# Patient Record
Sex: Male | Born: 1950 | Race: Black or African American | Hispanic: No | Marital: Single | Smoking: Never smoker
Health system: Southern US, Community
[De-identification: ages and names within clinical notes are randomized; demographics above are authoritative.]

## PROBLEM LIST (undated history)

## (undated) DIAGNOSIS — C799 Secondary malignant neoplasm of unspecified site: Secondary | ICD-10-CM

## (undated) DIAGNOSIS — F79 Unspecified intellectual disabilities: Secondary | ICD-10-CM

## (undated) DIAGNOSIS — F32A Depression, unspecified: Secondary | ICD-10-CM

## (undated) DIAGNOSIS — F329 Major depressive disorder, single episode, unspecified: Secondary | ICD-10-CM

## (undated) DIAGNOSIS — H269 Unspecified cataract: Secondary | ICD-10-CM

## (undated) DIAGNOSIS — C182 Malignant neoplasm of ascending colon: Secondary | ICD-10-CM

## (undated) DIAGNOSIS — E119 Type 2 diabetes mellitus without complications: Secondary | ICD-10-CM

## (undated) DIAGNOSIS — I10 Essential (primary) hypertension: Secondary | ICD-10-CM

## (undated) DIAGNOSIS — F29 Unspecified psychosis not due to a substance or known physiological condition: Secondary | ICD-10-CM

## (undated) DIAGNOSIS — D509 Iron deficiency anemia, unspecified: Secondary | ICD-10-CM

## (undated) DIAGNOSIS — C61 Malignant neoplasm of prostate: Secondary | ICD-10-CM

## (undated) DIAGNOSIS — F0390 Unspecified dementia without behavioral disturbance: Secondary | ICD-10-CM

## (undated) DIAGNOSIS — H9190 Unspecified hearing loss, unspecified ear: Secondary | ICD-10-CM

## (undated) HISTORY — DX: Essential (primary) hypertension: I10

## (undated) HISTORY — DX: Type 2 diabetes mellitus without complications: E11.9

## (undated) HISTORY — DX: Unspecified intellectual disabilities: F79

## (undated) HISTORY — DX: Depression, unspecified: F32.A

## (undated) HISTORY — DX: Unspecified psychosis not due to a substance or known physiological condition: F29

## (undated) HISTORY — DX: Unspecified cataract: H26.9

## (undated) HISTORY — DX: Major depressive disorder, single episode, unspecified: F32.9

## (undated) HISTORY — DX: Secondary malignant neoplasm of unspecified site: C79.9

## (undated) HISTORY — DX: Malignant neoplasm of prostate: C61

---

## 2000-04-14 HISTORY — PX: HEMORROIDECTOMY: SUR656

## 2000-08-26 ENCOUNTER — Encounter: Payer: Self-pay | Admitting: Family Medicine

## 2000-08-26 ENCOUNTER — Ambulatory Visit (HOSPITAL_COMMUNITY): Admission: RE | Admit: 2000-08-26 | Discharge: 2000-08-26 | Payer: Self-pay | Admitting: Family Medicine

## 2000-09-10 ENCOUNTER — Ambulatory Visit (HOSPITAL_COMMUNITY): Admission: RE | Admit: 2000-09-10 | Discharge: 2000-09-10 | Payer: Self-pay | Admitting: Family Medicine

## 2000-09-10 ENCOUNTER — Encounter: Payer: Self-pay | Admitting: Family Medicine

## 2000-10-27 ENCOUNTER — Ambulatory Visit (HOSPITAL_COMMUNITY): Admission: RE | Admit: 2000-10-27 | Discharge: 2000-10-27 | Payer: Self-pay | Admitting: Family Medicine

## 2000-10-27 ENCOUNTER — Encounter: Payer: Self-pay | Admitting: Family Medicine

## 2001-07-01 ENCOUNTER — Encounter: Admission: RE | Admit: 2001-07-01 | Discharge: 2001-09-29 | Payer: Self-pay | Admitting: Family Medicine

## 2001-08-04 ENCOUNTER — Encounter: Payer: Self-pay | Admitting: General Surgery

## 2001-08-04 ENCOUNTER — Ambulatory Visit (HOSPITAL_COMMUNITY): Admission: RE | Admit: 2001-08-04 | Discharge: 2001-08-04 | Payer: Self-pay | Admitting: General Surgery

## 2001-08-06 ENCOUNTER — Observation Stay (HOSPITAL_COMMUNITY): Admission: RE | Admit: 2001-08-06 | Discharge: 2001-08-07 | Payer: Self-pay | Admitting: General Surgery

## 2002-04-08 ENCOUNTER — Emergency Department (HOSPITAL_COMMUNITY): Admission: EM | Admit: 2002-04-08 | Discharge: 2002-04-08 | Payer: Self-pay | Admitting: Emergency Medicine

## 2004-02-26 ENCOUNTER — Ambulatory Visit: Payer: Self-pay | Admitting: Family Medicine

## 2004-03-25 ENCOUNTER — Ambulatory Visit: Payer: Self-pay | Admitting: Family Medicine

## 2004-05-07 ENCOUNTER — Ambulatory Visit: Payer: Self-pay | Admitting: Family Medicine

## 2004-07-02 ENCOUNTER — Ambulatory Visit: Payer: Self-pay | Admitting: Family Medicine

## 2004-12-19 ENCOUNTER — Ambulatory Visit: Payer: Self-pay | Admitting: Family Medicine

## 2005-02-11 ENCOUNTER — Ambulatory Visit: Payer: Self-pay | Admitting: Family Medicine

## 2005-04-02 ENCOUNTER — Ambulatory Visit: Payer: Self-pay | Admitting: Family Medicine

## 2005-04-29 ENCOUNTER — Ambulatory Visit: Payer: Self-pay | Admitting: Family Medicine

## 2005-05-06 ENCOUNTER — Ambulatory Visit (HOSPITAL_COMMUNITY): Admission: RE | Admit: 2005-05-06 | Discharge: 2005-05-06 | Payer: Self-pay | Admitting: Family Medicine

## 2005-05-19 ENCOUNTER — Ambulatory Visit (HOSPITAL_COMMUNITY): Admission: RE | Admit: 2005-05-19 | Discharge: 2005-05-19 | Payer: Self-pay | Admitting: Urology

## 2005-06-24 ENCOUNTER — Ambulatory Visit: Payer: Self-pay | Admitting: Family Medicine

## 2005-08-14 ENCOUNTER — Ambulatory Visit: Payer: Self-pay | Admitting: Family Medicine

## 2005-12-01 ENCOUNTER — Ambulatory Visit: Payer: Self-pay | Admitting: Family Medicine

## 2006-01-13 ENCOUNTER — Ambulatory Visit: Payer: Self-pay | Admitting: Family Medicine

## 2006-02-09 ENCOUNTER — Ambulatory Visit: Payer: Self-pay | Admitting: Family Medicine

## 2006-02-12 ENCOUNTER — Ambulatory Visit: Payer: Self-pay | Admitting: Family Medicine

## 2006-04-24 ENCOUNTER — Ambulatory Visit: Payer: Self-pay | Admitting: Family Medicine

## 2006-04-28 ENCOUNTER — Ambulatory Visit: Payer: Self-pay | Admitting: Family Medicine

## 2006-04-28 LAB — CONVERTED CEMR LAB
BUN: 37 mg/dL — ABNORMAL HIGH (ref 6–23)
Calcium: 8.9 mg/dL (ref 8.4–10.5)
Glucose, Bld: 110 mg/dL — ABNORMAL HIGH (ref 70–99)
PSA: 6.6 ng/mL
Sodium: 137 meq/L (ref 135–145)

## 2006-05-20 ENCOUNTER — Inpatient Hospital Stay (HOSPITAL_COMMUNITY): Admission: EM | Admit: 2006-05-20 | Discharge: 2006-05-20 | Payer: Self-pay | Admitting: Emergency Medicine

## 2006-06-04 ENCOUNTER — Ambulatory Visit: Payer: Self-pay | Admitting: Family Medicine

## 2006-06-18 ENCOUNTER — Ambulatory Visit: Payer: Self-pay | Admitting: Family Medicine

## 2006-07-21 ENCOUNTER — Inpatient Hospital Stay (HOSPITAL_COMMUNITY): Admission: EM | Admit: 2006-07-21 | Discharge: 2006-07-23 | Payer: Self-pay | Admitting: Emergency Medicine

## 2006-07-26 ENCOUNTER — Emergency Department (HOSPITAL_COMMUNITY): Admission: EM | Admit: 2006-07-26 | Discharge: 2006-07-26 | Payer: Self-pay | Admitting: Emergency Medicine

## 2006-08-05 ENCOUNTER — Ambulatory Visit: Payer: Self-pay | Admitting: Family Medicine

## 2006-08-11 ENCOUNTER — Encounter: Payer: Self-pay | Admitting: Family Medicine

## 2006-08-11 LAB — CONVERTED CEMR LAB
ALT: 9 units/L (ref 0–53)
AST: 15 units/L (ref 0–37)
Albumin: 4.5 g/dL (ref 3.5–5.2)
Bilirubin, Direct: 0.2 mg/dL (ref 0.0–0.3)
Calcium: 9.4 mg/dL (ref 8.4–10.5)
Cholesterol: 133 mg/dL (ref 0–200)
Glucose, Bld: 73 mg/dL (ref 70–99)
Indirect Bilirubin: 0.9 mg/dL (ref 0.0–0.9)
LDL Cholesterol: 54 mg/dL (ref 0–99)
Total Bilirubin: 1.1 mg/dL (ref 0.3–1.2)
Triglycerides: 74 mg/dL (ref <150)
VLDL: 15 mg/dL (ref 0–40)

## 2006-08-20 ENCOUNTER — Ambulatory Visit: Payer: Self-pay | Admitting: Family Medicine

## 2006-09-01 ENCOUNTER — Ambulatory Visit: Payer: Self-pay | Admitting: Family Medicine

## 2006-09-02 ENCOUNTER — Encounter: Payer: Self-pay | Admitting: Family Medicine

## 2006-09-17 ENCOUNTER — Ambulatory Visit: Payer: Self-pay | Admitting: Family Medicine

## 2006-09-17 ENCOUNTER — Emergency Department (HOSPITAL_COMMUNITY): Admission: EM | Admit: 2006-09-17 | Discharge: 2006-09-17 | Payer: Self-pay | Admitting: Emergency Medicine

## 2006-10-01 ENCOUNTER — Ambulatory Visit: Payer: Self-pay | Admitting: Family Medicine

## 2006-11-12 ENCOUNTER — Ambulatory Visit: Payer: Self-pay | Admitting: Family Medicine

## 2006-12-24 ENCOUNTER — Ambulatory Visit: Payer: Self-pay | Admitting: Family Medicine

## 2007-02-09 ENCOUNTER — Ambulatory Visit: Payer: Self-pay | Admitting: Family Medicine

## 2007-02-09 LAB — CONVERTED CEMR LAB
ALT: 8 units/L (ref 0–53)
AST: 13 units/L (ref 0–37)
Albumin: 4.3 g/dL (ref 3.5–5.2)
Alkaline Phosphatase: 47 units/L (ref 39–117)
BUN: 14 mg/dL (ref 6–23)
Bilirubin, Direct: 0.2 mg/dL (ref 0.0–0.3)
CO2: 21 meq/L (ref 19–32)
Chloride: 108 meq/L (ref 96–112)
Creatinine, Ser: 0.86 mg/dL (ref 0.40–1.50)
Eosinophils Absolute: 0 10*3/uL (ref 0.0–0.7)
Eosinophils Relative: 1 % (ref 0–5)
Lymphs Abs: 1.5 10*3/uL (ref 0.7–3.3)
Monocytes Absolute: 0.4 10*3/uL (ref 0.2–0.7)
Monocytes Relative: 6 % (ref 3–11)
Neutro Abs: 4.4 10*3/uL (ref 1.7–7.7)
Neutrophils Relative %: 70 % (ref 43–77)
Platelets: 293 10*3/uL (ref 150–400)
RDW: 15.2 % — ABNORMAL HIGH (ref 11.5–14.0)
Sodium: 141 meq/L (ref 135–145)
Total Bilirubin: 0.9 mg/dL (ref 0.3–1.2)
WBC: 6.3 10*3/uL (ref 4.0–10.5)

## 2007-02-10 ENCOUNTER — Ambulatory Visit (HOSPITAL_COMMUNITY): Admission: RE | Admit: 2007-02-10 | Discharge: 2007-02-10 | Payer: Self-pay | Admitting: Family Medicine

## 2007-03-25 ENCOUNTER — Ambulatory Visit: Payer: Self-pay | Admitting: Family Medicine

## 2007-04-15 ENCOUNTER — Encounter: Payer: Self-pay | Admitting: Family Medicine

## 2007-04-23 ENCOUNTER — Encounter: Payer: Self-pay | Admitting: Family Medicine

## 2007-04-23 DIAGNOSIS — E119 Type 2 diabetes mellitus without complications: Secondary | ICD-10-CM | POA: Insufficient documentation

## 2007-04-23 DIAGNOSIS — F29 Unspecified psychosis not due to a substance or known physiological condition: Secondary | ICD-10-CM | POA: Insufficient documentation

## 2007-04-23 DIAGNOSIS — R32 Unspecified urinary incontinence: Secondary | ICD-10-CM

## 2007-04-23 DIAGNOSIS — I10 Essential (primary) hypertension: Secondary | ICD-10-CM | POA: Insufficient documentation

## 2007-04-27 ENCOUNTER — Ambulatory Visit (HOSPITAL_COMMUNITY): Admission: RE | Admit: 2007-04-27 | Discharge: 2007-04-27 | Payer: Self-pay | Admitting: General Surgery

## 2007-05-28 ENCOUNTER — Ambulatory Visit (HOSPITAL_COMMUNITY): Admission: RE | Admit: 2007-05-28 | Discharge: 2007-05-28 | Payer: Self-pay | Admitting: Urology

## 2007-07-09 ENCOUNTER — Encounter (INDEPENDENT_AMBULATORY_CARE_PROVIDER_SITE_OTHER): Payer: Self-pay | Admitting: Urology

## 2007-07-09 ENCOUNTER — Inpatient Hospital Stay (HOSPITAL_COMMUNITY): Admission: RE | Admit: 2007-07-09 | Discharge: 2007-07-11 | Payer: Self-pay | Admitting: Urology

## 2007-07-09 ENCOUNTER — Encounter: Payer: Self-pay | Admitting: Family Medicine

## 2007-07-09 HISTORY — PX: TRANSURETHRAL RESECTION OF PROSTATE: SHX73

## 2007-07-27 ENCOUNTER — Ambulatory Visit: Payer: Self-pay | Admitting: Family Medicine

## 2007-07-28 ENCOUNTER — Encounter: Payer: Self-pay | Admitting: Family Medicine

## 2007-07-30 ENCOUNTER — Inpatient Hospital Stay (HOSPITAL_COMMUNITY): Admission: AD | Admit: 2007-07-30 | Discharge: 2007-08-02 | Payer: Self-pay | Admitting: Urology

## 2007-08-12 ENCOUNTER — Inpatient Hospital Stay (HOSPITAL_COMMUNITY): Admission: EM | Admit: 2007-08-12 | Discharge: 2007-08-14 | Payer: Self-pay | Admitting: Emergency Medicine

## 2007-08-14 ENCOUNTER — Encounter: Payer: Self-pay | Admitting: Family Medicine

## 2007-10-26 ENCOUNTER — Ambulatory Visit: Payer: Self-pay | Admitting: Family Medicine

## 2007-10-27 ENCOUNTER — Encounter: Payer: Self-pay | Admitting: Family Medicine

## 2007-10-27 LAB — CONVERTED CEMR LAB
ALT: 10 units/L (ref 0–53)
AST: 15 units/L (ref 0–37)
Albumin: 4.5 g/dL (ref 3.5–5.2)
Alkaline Phosphatase: 45 units/L (ref 39–117)
BUN: 20 mg/dL (ref 6–23)
Basophils Absolute: 0 10*3/uL (ref 0.0–0.1)
Basophils Relative: 0 % (ref 0–1)
CO2: 23 meq/L (ref 19–32)
Cholesterol: 162 mg/dL (ref 0–200)
Eosinophils Absolute: 0 10*3/uL (ref 0.0–0.7)
Glucose, Bld: 82 mg/dL (ref 70–99)
HCT: 42.4 % (ref 39.0–52.0)
Hemoglobin: 14.1 g/dL (ref 13.0–17.0)
Indirect Bilirubin: 0.9 mg/dL (ref 0.0–0.9)
LDL Cholesterol: 67 mg/dL (ref 0–99)
Lymphs Abs: 1.4 10*3/uL (ref 0.7–4.0)
Monocytes Relative: 5 % (ref 3–12)
Neutrophils Relative %: 65 % (ref 43–77)
Platelets: 263 10*3/uL (ref 150–400)
RDW: 14.3 % (ref 11.5–15.5)
Total Bilirubin: 1.2 mg/dL (ref 0.3–1.2)
VLDL: 11 mg/dL (ref 0–40)

## 2007-12-16 ENCOUNTER — Encounter: Payer: Self-pay | Admitting: Family Medicine

## 2007-12-30 ENCOUNTER — Ambulatory Visit: Payer: Self-pay | Admitting: Family Medicine

## 2008-01-04 DIAGNOSIS — E785 Hyperlipidemia, unspecified: Secondary | ICD-10-CM | POA: Insufficient documentation

## 2008-01-06 ENCOUNTER — Encounter: Payer: Self-pay | Admitting: Family Medicine

## 2008-01-27 ENCOUNTER — Encounter: Payer: Self-pay | Admitting: Family Medicine

## 2008-01-27 ENCOUNTER — Telehealth: Payer: Self-pay | Admitting: Family Medicine

## 2008-02-03 ENCOUNTER — Encounter: Payer: Self-pay | Admitting: Family Medicine

## 2008-02-24 ENCOUNTER — Encounter: Payer: Self-pay | Admitting: Family Medicine

## 2008-03-07 ENCOUNTER — Encounter: Payer: Self-pay | Admitting: Family Medicine

## 2008-03-22 ENCOUNTER — Ambulatory Visit: Payer: Self-pay | Admitting: Family Medicine

## 2008-03-22 DIAGNOSIS — R5381 Other malaise: Secondary | ICD-10-CM

## 2008-03-22 DIAGNOSIS — B351 Tinea unguium: Secondary | ICD-10-CM

## 2008-03-22 DIAGNOSIS — R5383 Other fatigue: Secondary | ICD-10-CM

## 2008-03-22 LAB — CONVERTED CEMR LAB: Hgb A1c MFr Bld: 5.4 %

## 2008-05-02 ENCOUNTER — Ambulatory Visit: Payer: Self-pay | Admitting: Family Medicine

## 2008-05-03 ENCOUNTER — Telehealth: Payer: Self-pay | Admitting: Family Medicine

## 2008-05-09 ENCOUNTER — Encounter: Payer: Self-pay | Admitting: Family Medicine

## 2008-05-22 ENCOUNTER — Encounter: Payer: Self-pay | Admitting: Family Medicine

## 2008-06-30 ENCOUNTER — Ambulatory Visit: Payer: Self-pay | Admitting: Family Medicine

## 2008-06-30 LAB — CONVERTED CEMR LAB: Glucose, Bld: 123 mg/dL

## 2008-07-06 ENCOUNTER — Ambulatory Visit: Payer: Self-pay | Admitting: Family Medicine

## 2008-07-06 DIAGNOSIS — R1900 Intra-abdominal and pelvic swelling, mass and lump, unspecified site: Secondary | ICD-10-CM | POA: Insufficient documentation

## 2008-07-06 LAB — HM DIABETES FOOT EXAM

## 2008-07-07 ENCOUNTER — Encounter: Payer: Self-pay | Admitting: Family Medicine

## 2008-07-11 ENCOUNTER — Encounter: Payer: Self-pay | Admitting: Family Medicine

## 2008-07-19 ENCOUNTER — Ambulatory Visit (HOSPITAL_COMMUNITY): Admission: RE | Admit: 2008-07-19 | Discharge: 2008-07-19 | Payer: Self-pay | Admitting: Family Medicine

## 2008-08-08 ENCOUNTER — Encounter: Payer: Self-pay | Admitting: Family Medicine

## 2008-09-07 ENCOUNTER — Encounter: Payer: Self-pay | Admitting: Family Medicine

## 2008-11-08 ENCOUNTER — Telehealth: Payer: Self-pay | Admitting: Family Medicine

## 2008-11-08 ENCOUNTER — Encounter: Payer: Self-pay | Admitting: Family Medicine

## 2008-11-15 ENCOUNTER — Encounter: Payer: Self-pay | Admitting: Family Medicine

## 2008-11-21 ENCOUNTER — Encounter: Payer: Self-pay | Admitting: Family Medicine

## 2009-01-04 ENCOUNTER — Encounter: Payer: Self-pay | Admitting: Family Medicine

## 2009-01-15 ENCOUNTER — Encounter: Payer: Self-pay | Admitting: Family Medicine

## 2009-01-19 ENCOUNTER — Ambulatory Visit: Payer: Self-pay | Admitting: Family Medicine

## 2009-01-19 LAB — CONVERTED CEMR LAB
Glucose, Bld: 162 mg/dL
Hgb A1c MFr Bld: 5.9 %

## 2009-04-14 DIAGNOSIS — H269 Unspecified cataract: Secondary | ICD-10-CM

## 2009-04-14 HISTORY — DX: Unspecified cataract: H26.9

## 2009-04-23 ENCOUNTER — Encounter: Payer: Self-pay | Admitting: Family Medicine

## 2009-05-04 ENCOUNTER — Encounter: Payer: Self-pay | Admitting: Family Medicine

## 2009-05-17 ENCOUNTER — Ambulatory Visit: Payer: Self-pay | Admitting: Family Medicine

## 2009-05-17 DIAGNOSIS — E559 Vitamin D deficiency, unspecified: Secondary | ICD-10-CM | POA: Insufficient documentation

## 2009-05-17 LAB — CONVERTED CEMR LAB: Hgb A1c MFr Bld: 5.8 %

## 2009-05-21 ENCOUNTER — Encounter: Payer: Self-pay | Admitting: Family Medicine

## 2009-05-21 LAB — CONVERTED CEMR LAB
Alkaline Phosphatase: 92 units/L (ref 39–117)
Basophils Absolute: 0 10*3/uL (ref 0.0–0.1)
Calcium: 9.3 mg/dL (ref 8.4–10.5)
Chloride: 102 meq/L (ref 96–112)
Cholesterol: 163 mg/dL (ref 0–200)
Eosinophils Absolute: 0.1 10*3/uL (ref 0.0–0.7)
Hep A IgM: NEGATIVE
Hepatitis B Surface Ag: NEGATIVE
LDL Cholesterol: 81 mg/dL (ref 0–99)
Lymphs Abs: 1.7 10*3/uL (ref 0.7–4.0)
MCHC: 32.4 g/dL (ref 30.0–36.0)
Monocytes Absolute: 0.4 10*3/uL (ref 0.1–1.0)
Monocytes Relative: 7 % (ref 3–12)
RDW: 14.6 % (ref 11.5–15.5)
Total Bilirubin: 0.6 mg/dL (ref 0.3–1.2)
Total Protein: 7.3 g/dL (ref 6.0–8.3)
VLDL: 21 mg/dL (ref 0–40)
Vit D, 25-Hydroxy: 21 ng/mL — ABNORMAL LOW (ref 30–89)
WBC: 5.6 10*3/uL (ref 4.0–10.5)

## 2009-06-15 ENCOUNTER — Ambulatory Visit: Payer: Self-pay | Admitting: Family Medicine

## 2009-06-15 DIAGNOSIS — E669 Obesity, unspecified: Secondary | ICD-10-CM

## 2009-06-21 ENCOUNTER — Encounter: Payer: Self-pay | Admitting: Family Medicine

## 2009-06-27 ENCOUNTER — Encounter: Payer: Self-pay | Admitting: Family Medicine

## 2009-08-24 ENCOUNTER — Ambulatory Visit: Payer: Self-pay | Admitting: Family Medicine

## 2009-08-24 DIAGNOSIS — K5900 Constipation, unspecified: Secondary | ICD-10-CM | POA: Insufficient documentation

## 2009-08-27 ENCOUNTER — Telehealth: Payer: Self-pay | Admitting: Family Medicine

## 2009-09-25 ENCOUNTER — Encounter: Payer: Self-pay | Admitting: Family Medicine

## 2009-10-31 ENCOUNTER — Encounter: Payer: Self-pay | Admitting: Family Medicine

## 2009-11-18 ENCOUNTER — Encounter: Payer: Self-pay | Admitting: Family Medicine

## 2009-11-19 ENCOUNTER — Ambulatory Visit: Payer: Self-pay | Admitting: Family Medicine

## 2009-11-19 ENCOUNTER — Ambulatory Visit (HOSPITAL_COMMUNITY): Admission: RE | Admit: 2009-11-19 | Discharge: 2009-11-19 | Payer: Self-pay | Admitting: Family Medicine

## 2009-11-19 LAB — CONVERTED CEMR LAB
BUN: 14 mg/dL (ref 6–23)
Calcium: 9.7 mg/dL (ref 8.4–10.5)
Chloride: 104 meq/L (ref 96–112)
Glucose, Bld: 89 mg/dL (ref 70–99)
HCT: 41.7 % (ref 39.0–52.0)
Hemoglobin: 14.1 g/dL (ref 13.0–17.0)
Lymphocytes Relative: 30 % (ref 12–46)
Lymphs Abs: 1.9 10*3/uL (ref 0.7–4.0)
MCHC: 33.8 g/dL (ref 30.0–36.0)
MCV: 76.7 fL — ABNORMAL LOW (ref 78.0–100.0)
Monocytes Absolute: 0.5 10*3/uL (ref 0.1–1.0)
Neutro Abs: 4 10*3/uL (ref 1.7–7.7)
RBC: 5.44 M/uL (ref 4.22–5.81)

## 2009-11-23 ENCOUNTER — Encounter: Payer: Self-pay | Admitting: Family Medicine

## 2010-01-24 ENCOUNTER — Ambulatory Visit: Payer: Self-pay | Admitting: Family Medicine

## 2010-02-20 ENCOUNTER — Telehealth: Payer: Self-pay | Admitting: Family Medicine

## 2010-03-20 LAB — CONVERTED CEMR LAB
ALT: 14 units/L (ref 0–53)
AST: 19 units/L (ref 0–37)
Albumin: 4.6 g/dL (ref 3.5–5.2)
Alkaline Phosphatase: 97 units/L (ref 39–117)
BUN: 15 mg/dL (ref 6–23)
Calcium: 9.4 mg/dL (ref 8.4–10.5)
Chloride: 103 meq/L (ref 96–112)
Creatinine, Ser: 0.99 mg/dL (ref 0.40–1.50)
Creatinine, Urine: 114.6 mg/dL
Microalb Creat Ratio: 5.6 mg/g (ref 0.0–30.0)
Microalb, Ur: 0.64 mg/dL (ref 0.00–1.89)
Potassium: 4 meq/L (ref 3.5–5.3)
Total Bilirubin: 0.8 mg/dL (ref 0.3–1.2)
Total Protein: 7.9 g/dL (ref 6.0–8.3)

## 2010-05-12 LAB — CONVERTED CEMR LAB: Glucose, Bld: 156 mg/dL

## 2010-05-16 ENCOUNTER — Encounter: Payer: Self-pay | Admitting: Family Medicine

## 2010-05-16 NOTE — Progress Notes (Signed)
  Phone Note Call from Patient   Caller: Patient Summary of Call: facility called states patient has a loose toenail and he needs to be seen asap - he is diabetic, states patient does not have a podiatrist call back jonathan edwards [phone] Initial call taken by: Adella Hare LPN,  February 20, 2010 8:44 AM  Follow-up for Phone Call        pls get appt at podiatrist to treat diabetic with loose toenail, asap, pls , call doc in [place] or [place] preferably Follow-up by: Syliva Overman MD,  February 20, 2010 11:27 AM  Additional Follow-up for Phone Call Additional follow up Details #1::        CALLED DR. TUCKERS AND HE OWES MONEY AND I CALLED JOHNATHAN AND HE HAS ALREADY MADE HIM AN APPOINMENT Additional Follow-up by: Lind Guest,  February 20, 2010 3:11 PM    Additional Follow-up for Phone Call Additional follow up Details #2::    thank you Follow-up by: Syliva Overman MD,  February 20, 2010 5:00 PM

## 2010-05-16 NOTE — Miscellaneous (Signed)
Summary: Home Care Report  Home Care Report   Imported By: Lind Guest 09/26/2009 11:32:07  _____________________________________________________________________  External Attachment:    Type:   Image     Comment:   External Document

## 2010-05-16 NOTE — Progress Notes (Signed)
Summary: TESTING STRIPS  Phone Note Call from Patient   Summary of Call: NEEDS A STATEMENT SEND TO [facility] / CARE THAT HE CHECKS HIS SUGARS DAILY AND THAT HE NEEDS TESTING STRIPS AND HE IS COMPLETELY OUT NEEDS ASAP CALL BACK AT [phone]  Initial call taken by: Lind Guest,  Aug 27, 2009 9:49 AM    Prescriptions: ACCU-CHEK COMPACT  STRP (GLUCOSE BLOOD) once daily testing  #100 x 2   Entered by:   Everitt Amber LPN   Authorized by:   Syliva Overman MD   Signed by:   Everitt Amber LPN on 04/54/0981   Method used:   Electronically to        [facility]* (retail)       [address]       [place], [place]  [address]       Ph: [phone]       Fax: [phone]   RxID:   [id number] ACCU-CHEK SOFT TOUCH LANCETS  MISC (LANCETS) once daily testing  #100 x 2   Entered by:   Everitt Amber LPN   Authorized by:   Syliva Overman MD   Signed by:   Everitt Amber LPN on 36/64/4034   Method used:   Electronically to        [facility]* (retail)       [address]       [place], [place]  [address]       Ph: [phone]       Fax: [phone]   RxID:   [id number]

## 2010-05-16 NOTE — Letter (Signed)
Summary: Discharge Summary  Discharge Summary   Imported By: Lind Guest 11/23/2009 15:54:09  _____________________________________________________________________  External Attachment:    Type:   Image     Comment:   External Document

## 2010-05-16 NOTE — Miscellaneous (Signed)
Summary: Home Care Report  Home Care Report   Imported By: Lind Guest 10/31/2009 09:04:22  _____________________________________________________________________  External Attachment:    Type:   Image     Comment:   External Document

## 2010-05-16 NOTE — Assessment & Plan Note (Signed)
Summary: dental clearance- room 1   Vital Signs:  Patient profile:   60 year old male Height:      62.5 inches Weight:      182.25 pounds BMI:     32.92 O2 Sat:      99 % on Room air Pulse rate:   81 / minute Resp:     16 per minute BP sitting:   104 / 80  (left arm)  Vitals Entered By: Adella Hare LPN (Aug 24, 2009 8:54 AM) CC: dental clearance Is Patient Diabetic? Yes Did you bring your meter with you today? No Pain Assessment Patient in pain? no      Comments did not bring meds to ov   CC:  dental clearance.  History of Present Illness: Pt needs medical clearance for sedation to have dental xrays and cleaning done.  He is noncooperative when he goes to the dentist so they are unable to do an exam or cleaning without sedation.  Pt presents today with care giver from his group home.  No complaints or concerns.  Appetite has been normal.  No c/o chest pain, or difficulty breathing.  He does have intermittent constipation which they give him  MOM for as needed & works well.   He has been on a low carb diet for weight loss and has lost 8 # since his previous visit. Hx of htn. Taking medications as prescribed. No headache, or chest pain.       Allergies: 1)  ! Ace Inhibitors  Past History:  Past medical history reviewed for relevance to current acute and chronic problems.  Past Medical History: Reviewed history from 12/30/2007 and no changes required. Urinary Incontinence Psychotic Disorder, stable Diabetes mellitus, type II Hypertension Prostate cancer PMH reviewed for relevance  Review of Systems General:  Denies chills and fever. CV:  Denies chest pain or discomfort. Resp:  Denies cough and shortness of breath. GI:  Complains of constipation; denies abdominal pain, bloody stools, dark tarry stools, loss of appetite, nausea, and vomiting. GU:  Denies dysuria and urinary frequency.  Physical Exam  General:  Well-developed,well-nourished,in no acute  distress; alert,appropriate and cooperative throughout examination Head:  Normocephalic and atraumatic without obvious abnormalities. No apparent alopecia or balding. Eyes:  pupils equal, pupils round, pupils reactive to light, and no injection or jaundice. Ears:  External ear exam shows no significant lesions or deformities.  Otoscopic examination reveals clear canals, tympanic membranes are intact bilaterally without bulging, retraction, inflammation or discharge. Hearing is grossly normal bilaterally. Nose:  External nasal examination shows no deformity or inflammation. Nasal mucosa are pink and moist without lesions or exudates. Mouth:  Oral mucosa and oropharynx without lesions or exudates.   Neck:  No deformities, masses, or tenderness noted. Lungs:  Normal respiratory effort, chest expands symmetrically. Lungs are clear to auscultation, no crackles or wheezes. Heart:  Normal rate and regular rhythm. S1 and S2 normal without gallop, murmur, click, rub or other extra sounds. Abdomen:  soft, non-tender, no masses, no hepatomegaly, and no splenomegaly.   Cervical Nodes:  No lymphadenopathy noted Psych:  good eye contact, flat affect, and subdued.     Impression & Recommendations:  Problem # 1:  HYPERTENSION (ICD-401.9) Assessment Improved  His updated medication list for this problem includes:    Hydrochlorothiazide 25 Mg Tabs (Hydrochlorothiazide) .Marland Kitchen... Take 1 tablet by mouth once a day  BP today: 104/80 Prior BP: 130/80 (06/15/2009)  Labs Reviewed: K+: 4.1 (05/18/2009) Creat: : 0.92 (05/18/2009)  Chol: 163 (05/18/2009)   HDL: 61 (05/18/2009)   LDL: 81 (05/18/2009)   TG: 104 (05/18/2009)  Problem # 2:  OBESITY (ICD-278.00) Assessment: Improved  Ht: 62.5 (08/24/2009)   Wt: 182.25 (08/24/2009)   BMI: 32.92 (08/24/2009)  Problem # 3:  CONSTIPATION, INTERMITTENT (ICD-564.00) Assessment: Unchanged  His updated medication list for this problem includes:    Dulcolax 5 Mg Tbec  (Bisacodyl) ..... One tab by mouth three times a day prn  Problem # 4:  UNSPECIFIED PSYCHOSIS (ICD-298.9) Assessment: Unchanged  Complete Medication List: 1)  Omeprazole 20 Mg Tbec (Omeprazole) .... Take one tablet by mouth once a day 2)  Simvastatin 40 Mg Tabs (Simvastatin) .... Take one tablet by mouth once a day 3)  Aspirin 81 Mg Tbec (Aspirin) .... Take 1 tablet by mouth once a day 4)  Citalopram Hydrobromide 40 Mg Tabs (Citalopram hydrobromide) .... Take 1 tablet by mouth once a day 5)  Flomax 0.4 Mg Xr24h-cap (Tamsulosin hcl) .... Take 1 tablet by mouth once a day 6)  Benadryl Allergy Childrens 12.5 Mg Chew (Diphenhydramine hcl) .... One tab by mouth three times a day prn 7)  Normal Saline Flush 0.9 % Soln (Sodium chloride) .... Uad for cleaning wounds prn 8)  Tylenol 325 Mg Tabs (Acetaminophen) .... One tab by mouth two times a day prn 9)  Dulcolax 5 Mg Tbec (Bisacodyl) .... One tab by mouth three times a day prn 10)  Geodon 80 Mg Caps (Ziprasidone hcl) .... One cap by mouth at bedtime 11)  Seroquel 100 Mg Tabs (Quetiapine fumarate) .... Two tabs by mouth at bedtime 12)  Hydrochlorothiazide 25 Mg Tabs (Hydrochlorothiazide) .... Take 1 tablet by mouth once a day 13)  Klor-con M20 20 Meq Cr-tabs (Potassium chloride crys cr) .... Take 1 tablet by mouth once a day 14)  Accu-chek Compact Strp (Glucose blood) .... Once daily testing 15)  Accu-chek Soft Touch Lancets Misc (Lancets) .... Once daily testing  Patient Instructions: 1)  Please schedule a follow-up appointment in 2 months. 2)  It is important that you exercise regularly at least 20 minutes 5 times a week. If you develop chest pain, have severe difficulty breathing, or feel very tired , stop exercising immediately and seek medical attention. 3)  You need to lose weight. Consider a lower calorie diet and regular exercise. 4)  Keep up the good work with the weight loss! 5)

## 2010-05-16 NOTE — Assessment & Plan Note (Signed)
Summary: PHY room 3   Vital Signs:  Patient profile:   60 year old male Height:      62.5 inches Weight:      186.25 pounds BMI:     33.64 O2 Sat:      98 % on Room air Pulse rate:   91 / minute Resp:     16 per minute BP sitting:   130 / 90  (left arm)  Vitals Entered By: Johny Drilling  Serial Vital Signs/Assessments:  Time      Position  BP       Pulse  Resp  Temp     By                     120/82                         Esperanza Sheets PA  CC: physical Comments Did not bring meds   CC:  physical.  History of Present Illness: Pt presents today for a physical but he had a physical Aug 2011.  He is due for check up appt today. Hx of htn. Taking medications as prescribed and denies side effects.  No headache, chest pain or palpitations. Hx of DM.  Taking medications as prescribed and denies side effects. No episodes of hypoglycemia. Care home checking blood sugar every morning. This am FBS 113. Last eye exam Jan 2011 Taking ASA daily. Pneumovax UTD. Needs flu vaccine. No podiatrist.  Hx of mental impairment, depression and psychotic disorder.  Per care giver is in a depressive phase currently. This usually lasts 1-4 weeks.  Sees psychiatrist.  [facility] urologist every 3 mos for follow up of Prostate CA. Last PSA 12-13-09.    Allergies (verified): 1)  ! Ace Inhibitors  Past History:  Past medical history reviewed for relevance to current acute and chronic problems.  Past Medical History: Reviewed history from 11/19/2009 and no changes required. Urinary Incontinence Psychotic Disorder, stable Diabetes mellitus, type II Hypertension Prostate cancer Bilateral cataracrts  2011 Vit D deficiency  2011 mental retardation  Past Surgical History: Reviewed history from 11/19/2009 and no changes required. Hemorroidectomy- 2002 TURP-July 09, 2007  Family History: Reviewed history from 04/23/2007 and no changes required. Mother is 69 and has HTN, diabetes and mental  illness. Father is deceased at age 84 with a stroke. Has no siblings.  Social History: Reviewed history from 04/23/2007 and no changes required. Patient is disabled. He is single and has no children. Denies alcohol, tobacco or street drug use.  Review of Systems General:  Denies chills and fever. ENT:  Denies earache, nasal congestion, sinus pressure, and sore throat. CV:  Denies chest pain or discomfort and palpitations. Resp:  Denies cough and shortness of breath. GI:  Denies abdominal pain, change in bowel habits, nausea, and vomiting.  Physical Exam  General:  Well-developed,well-nourished,in no acute distress; alert,appropriate and cooperative throughout examination Head:  Normocephalic and atraumatic without obvious abnormalities. No apparent alopecia or balding. Ears:  External ear exam shows no significant lesions or deformities.  Otoscopic examination reveals clear canals, tympanic membranes are intact bilaterally without bulging, retraction, inflammation or discharge. Hearing is grossly normal bilaterally. Nose:  External nasal examination shows no deformity or inflammation. Nasal mucosa are pink and moist without lesions or exudates. Mouth:  Oral mucosa and oropharynx without lesions or exudates.   Neck:  No deformities, masses, or tenderness noted. Lungs:  Normal respiratory effort, chest expands  symmetrically. Lungs are clear to auscultation, no crackles or wheezes. Heart:  Normal rate and regular rhythm. S1 and S2 normal without gallop, murmur, click, rub or other extra sounds. Pulses:  R posterior tibial normal, R dorsalis pedis normal, L posterior tibial normal, and L dorsalis pedis normal.   Extremities:  No PET Cervical Nodes:  No lymphadenopathy noted Psych:  flat affect, withdrawn, and poor eye contact.     Impression & Recommendations:  Problem # 1:  HYPERTENSION (ICD-401.9) Assessment Comment Only  His updated medication list for this problem includes:     Hydrochlorothiazide 25 Mg Tabs (Hydrochlorothiazide) .Marland Kitchen... Take 1 tablet by mouth once a day  BP today: 130/90 Prior BP: 120/88 (11/19/2009)  Labs Reviewed: K+: 4.2 (11/19/2009) Creat: : 0.95 (11/19/2009)   Chol: 163 (05/18/2009)   HDL: 61 (05/18/2009)   LDL: 81 (05/18/2009)   TG: 104 (05/18/2009)  Problem # 2:  DIABETES MELLITUS, TYPE II (ICD-250.00) Assessment: Comment Only  His updated medication list for this problem includes:    Aspirin 81 Mg Tbec (Aspirin) .Marland Kitchen... Take 1 tablet by mouth once a day  Orders: T-CMP with estimated GFR ([address]) T- Hemoglobin A1C (56213-08657) T-Urine Microalbumin w/creat. ratio [id number])  Problem # 3:  HYPERLIPIDEMIA (ICD-272.4) Assessment: Comment Only  Orders: T-CMP with estimated GFR ([id number]) T-Lipid Profile ([id number])  Labs Reviewed: SGOT: 52 (05/18/2009)   SGPT: 65 (05/18/2009)   HDL:61 (05/18/2009), 84 (10/27/2007)  LDL:81 (05/18/2009), 67 (10/27/2007)  Chol:163 (05/18/2009), 162 (10/27/2007)  Trig:104 (05/18/2009), 54 (10/27/2007)  Complete Medication List: 1)  Omeprazole 20 Mg Tbec (Omeprazole) .... Take one tablet by mouth once a day 2)  Aspirin 81 Mg Tbec (Aspirin) .... Take 1 tablet by mouth once a day 3)  Citalopram Hydrobromide 40 Mg Tabs (Citalopram hydrobromide) .... Take 1 tablet by mouth once a day 4)  Flomax 0.4 Mg Xr24h-cap (Tamsulosin hcl) .... Take 1 tablet by mouth once a day 5)  Seroquel Xr 150 Mg Xr24h-tab (Quetiapine fumarate) .... Take 1 tablet by mouth once a day 6)  Hydrochlorothiazide 25 Mg Tabs (Hydrochlorothiazide) .... Take 1 tablet by mouth once a day 7)  Klor-con M20 20 Meq Cr-tabs (Potassium chloride crys cr) .... Take 1 tablet by mouth once a day 8)  Accu-chek Compact Strp (Glucose blood) .... Once daily testing 9)  Accu-chek Soft Touch Lancets Misc (Lancets) .... Once daily testing 10)  Diphenoxylate-atropine 2.5-0.025 Mg Tabs (Diphenoxylate-atropine) .... As needed 11)  Namenda 10 Mg  Tabs (Memantine hcl) .... 2 tabs at bedtime 12)  Vitamin D (ergocalciferol) 50000 Unit Caps (Ergocalciferol) .... One capsule once weekly 13)  Dulcolax 5 Mg Tbec (Bisacodyl) .... One tablet every 3 days as needed for constipation 14)  Geodon 80 Mg Caps (Ziprasidone hcl) .... 2 capsules with evening meal  Other Orders: T-CBC No Diff ([id number]) T-PSA ([id number]) Influenza Vaccine MCR ([address]606)  Patient Instructions: 1)  Please schedule a follow-up appointment in 3 months. 2)  Have blood work drawn fasting after 02-19-10. 3)  You have received a flu vaccine today. 4)  Continue your current medications.   Immunizations Administered:  Influenza Vaccine # 1:    Vaccine Type: Fluvax MCR    Site: right deltoid    Mfr: novartis    Dose: 0.5 ml    Route: IM    Given by: Mauricia Area CMA    Exp. Date: 08/2010    Lot #: [id number]    VIS given: 11/06/09 version given January 24, 2010.

## 2010-05-16 NOTE — Miscellaneous (Signed)
Summary: Meter  Clinical Lists Changes  Medications: Added new medication of ACCU-CHEK COMPACT  STRP (GLUCOSE BLOOD) once daily testing - Signed Added new medication of ACCU-CHEK SOFT TOUCH LANCETS  MISC (LANCETS) once daily testing - Signed Rx of ACCU-CHEK COMPACT  STRP (GLUCOSE BLOOD) once daily testing;  #100 x 2;  Signed;  Entered by: Everitt Amber;  Authorized by: Syliva Overman MD;  Method used: Electronically to [facility] [address], [place], [place], [place]  [address], Ph: [phone], Fax: [phone] Rx of ACCU-CHEK SOFT TOUCH LANCETS  MISC (LANCETS) once daily testing;  #100 x 2;  Signed;  Entered by: Everitt Amber;  Authorized by: Syliva Overman MD;  Method used: Electronically to [facility] [address], [place], [place], [place]  [address], Ph: [phone], Fax: [phone]    Prescriptions: ACCU-CHEK SOFT TOUCH LANCETS  MISC (LANCETS) once daily testing  #100 x 2   Entered by:   Everitt Amber   Authorized by:   Syliva Overman MD   Signed by:   Everitt Amber on 05/04/2009   Method used:   Electronically to        [facility] (retail)       [address]       [place], [place]  [address]       Ph: [phone]       Fax: [phone]   RxID:   [id number] ACCU-CHEK COMPACT  STRP (GLUCOSE BLOOD) once daily testing  #100 x 2   Entered by:   Everitt Amber   Authorized by:   Syliva Overman MD   Signed by:   Everitt Amber on 05/04/2009   Method used:   Electronically to        [facility] (retail)       [address]       [place], [place]  [address]       Ph: [phone]       Fax: [phone]   RxID:   [id number]

## 2010-05-16 NOTE — Letter (Signed)
Summary: Historic Patient File  Historic Patient File   Imported By: Lind Guest 01/10/2010 13:12:18  _____________________________________________________________________  External Attachment:    Type:   Image     Comment:   External Document

## 2010-05-16 NOTE — Assessment & Plan Note (Signed)
Summary: office visit   Vital Signs:  Patient profile:   60 year old male Height:      62.5 inches Weight:      186.25 pounds BMI:     33.64 O2 Sat:      97 % Pulse rate:   89 / minute Pulse rhythm:   regular Resp:     16 per minute BP sitting:   120 / 80 Cuff size:   large  Vitals Entered By: Everitt Amber (May 17, 2009 8:42 AM)  Nutrition Counseling: Patient's BMI is greater than 25 and therefore counseled on weight management options. CC: Follow up chronic problems Is Patient Diabetic? Yes   CC:  Follow up chronic problems.  History of Present Illness: Reports  thathe has been doing well. Denies recent fever or chills. Denies sinus pressure, nasal congestion , ear pain or sore throat. Denies chest congestion, or cough productive of sputum. Denies chest pain, palpitations, PND, orthopnea or leg swelling. Denies abdominal pain, nausea, vomitting, diarrhea or constipation. Denies change in bowel movements or bloody stool. Denies dysuria , frequency, incontinence or hesitancy. Denies  joint pain, swelling, or reduced mobility. Denies headaches, vertigo, seizures. Denies depression, anxiety or insomnia. Denies  rash, lesions, or itch.     Current Medications (verified): 1)  Omeprazole 20 Mg  Tbec (Omeprazole) .... Take One Tablet By Mouth Once A Day 2)  Simvastatin 40 Mg  Tabs (Simvastatin) .... Take One Tablet By Mouth Once A Day 3)  Aspirin 81 Mg  Tbec (Aspirin) .... Take 1 Tablet By Mouth Once A Day 4)  Citalopram Hydrobromide 40 Mg Tabs (Citalopram Hydrobromide) .... Take 1 Tablet By Mouth Once A Day 5)  Flomax 0.4 Mg Xr24h-Cap (Tamsulosin Hcl) .... Take 1 Tablet By Mouth Once A Day 6)  Benadryl Allergy Childrens 12.5 Mg Chew (Diphenhydramine Hcl) .... One Tab By Mouth Three Times A Day Prn 7)  Normal Saline Flush 0.9 % Soln (Sodium Chloride) .... Uad For Cleaning Wounds Prn 8)  Tylenol 325 Mg Tabs (Acetaminophen) .... One Tab By Mouth Two Times A Day Prn 9)   Dulcolax 5 Mg Tbec (Bisacodyl) .... One Tab By Mouth Three Times A Day Prn 10)  Geodon 80 Mg Caps (Ziprasidone Hcl) .... One Cap By Mouth At Bedtime 11)  Seroquel 100 Mg Tabs (Quetiapine Fumarate) .... Two Tabs By Mouth At Bedtime 12)  Hydrochlorothiazide 25 Mg Tabs (Hydrochlorothiazide) .... Take 1 Tablet By Mouth Once A Day 13)  Klor-Con M20 20 Meq Cr-Tabs (Potassium Chloride Crys Cr) .... Take 1 Tablet By Mouth Once A Day 14)  Accu-Chek Compact  Strp (Glucose Blood) .... Once Daily Testing 15)  Accu-Chek Soft Touch Lancets  Misc (Lancets) .... Once Daily Testing  Allergies (verified): 1)  ! Ace Inhibitors  Review of Systems      See HPI Eyes:  Denies discharge and red eye. Endo:  Denies cold intolerance, excessive hunger, excessive thirst, excessive urination, heat intolerance, polyuria, and weight change. Heme:  Denies abnormal bruising and bleeding. Allergy:  Denies hives or rash and itching eyes.  Physical Exam  General:  Well-developed,well-nourished,in no acute distress; alert,appropriate and cooperative throughout examination HEENT: No facial asymmetry,  EOMI, No sinus tenderness, TM's Clear, oropharynx  pink and moist.   Chest: Clear to auscultation bilaterally.  CVS: S1, S2, No murmurs, No S3.   Abd: Soft, Nontender.  MS: Adequate ROM spine, hips, shoulders and knees.  Ext: No edema.   CNS: CN 2-12 intact, power  tone and sensation normal throughout.   Skin: Intact, no visible lesions or rashes.  Psych: Good eye contact, normal affect.  Memory loss, not anxious or depressed appearing.    Impression & Recommendations:  Problem # 1:  HYPERLIPIDEMIA (ICD-272.4) Assessment Comment Only  His updated medication list for this problem includes:    Simvastatin 40 Mg Tabs (Simvastatin) .Marland Kitchen... Take one tablet by mouth once a day  Orders: T-Hepatic Function [id number]) T-Lipid Profile [id number])  Labs Reviewed: SGOT: 15 (10/27/2007)   SGPT: 10 (10/27/2007)   HDL:84  (10/27/2007), 64 (29/56/2130)  LDL:67 (10/27/2007), 54 (08/11/2006)  Chol:162 (10/27/2007), 133 (08/11/2006)  Trig:54 (10/27/2007), 74 (08/11/2006)  Problem # 2:  HYPERTENSION (ICD-401.9) Assessment: Unchanged  His updated medication list for this problem includes:    Hydrochlorothiazide 25 Mg Tabs (Hydrochlorothiazide) .Marland Kitchen... Take 1 tablet by mouth once a day  Orders: T-Basic Metabolic Panel [id number])  BP today: 120/80 Prior BP: 120/80 (01/19/2009)  Labs Reviewed: K+: 3.9 (10/27/2007) Creat: : 0.96 (07/07/2008)   Chol: 162 (10/27/2007)   HDL: 84 (10/27/2007)   LDL: 67 (10/27/2007)   TG: 54 (10/27/2007)  Problem # 3:  UNSPECIFIED PSYCHOSIS (ICD-298.9) Assessment: Improved  Problem # 4:  DIABETES MELLITUS, TYPE II (ICD-250.00) Assessment: Unchanged  His updated medication list for this problem includes:    Aspirin 81 Mg Tbec (Aspirin) .Marland Kitchen... Take 1 tablet by mouth once a day  Orders: Glucose, (CBG) ([id number]) Hemoglobin A1C (83036)  Labs Reviewed: Creat: 0.96 (07/07/2008)    Reviewed HgBA1c results: 5.8 (05/17/2009)  5.9 (01/19/2009)  Complete Medication List: 1)  Omeprazole 20 Mg Tbec (Omeprazole) .... Take one tablet by mouth once a day 2)  Simvastatin 40 Mg Tabs (Simvastatin) .... Take one tablet by mouth once a day 3)  Aspirin 81 Mg Tbec (Aspirin) .... Take 1 tablet by mouth once a day 4)  Citalopram Hydrobromide 40 Mg Tabs (Citalopram hydrobromide) .... Take 1 tablet by mouth once a day 5)  Flomax 0.4 Mg Xr24h-cap (Tamsulosin hcl) .... Take 1 tablet by mouth once a day 6)  Benadryl Allergy Childrens 12.5 Mg Chew (Diphenhydramine hcl) .... One tab by mouth three times a day prn 7)  Normal Saline Flush 0.9 % Soln (Sodium chloride) .... Uad for cleaning wounds prn 8)  Tylenol 325 Mg Tabs (Acetaminophen) .... One tab by mouth two times a day prn 9)  Dulcolax 5 Mg Tbec (Bisacodyl) .... One tab by mouth three times a day prn 10)  Geodon 80 Mg Caps (Ziprasidone hcl) .... One  cap by mouth at bedtime 11)  Seroquel 100 Mg Tabs (Quetiapine fumarate) .... Two tabs by mouth at bedtime 12)  Hydrochlorothiazide 25 Mg Tabs (Hydrochlorothiazide) .... Take 1 tablet by mouth once a day 13)  Klor-con M20 20 Meq Cr-tabs (Potassium chloride crys cr) .... Take 1 tablet by mouth once a day 14)  Accu-chek Compact Strp (Glucose blood) .... Once daily testing 15)  Accu-chek Soft Touch Lancets Misc (Lancets) .... Once daily testing  Other Orders: T-CBC w/Diff [phone]) T-TSH [id number]) T-Vitamin D (25-Hydroxy) [id number])  Patient Instructions: 1)  F/U in 3.5 months. 2)  BMP prior to visit, ICD-9: 3)  Hepatic Panel prior to visit, ICD-9: 4)  Lipid Panel prior to visit, ICD-9: 5)  TSH prior to visit, ICD-9: 6)  CBC w/ Diff prior to visit, ICD-9: 7)  vit D asap 8)  It is important that you exercise regularly at least 20 minutes 5 times a week. If you develop chest pain, have severe  difficulty breathing, or feel very tired , stop exercising immediately and seek medical attention. 9)  You need to lose weight. Consider a lower calorie diet and regular exercise.  10)  No med changes  Laboratory Results   Blood Tests   Date/Time Received: May 17, 2009  Date/Time Reported: May 17, 2009   Glucose (random): 103 mg/dL   (Normal Range: 16-109) HGBA1C: 5.8%   (Normal Range: Non-Diabetic - 3-6%   Control Diabetic - 6-8%)

## 2010-05-16 NOTE — Miscellaneous (Signed)
Summary: Home Care Report  Home Care Report   Imported By: Lind Guest 05/17/2009 11:09:26  _____________________________________________________________________  External Attachment:    Type:   Image     Comment:   External Document

## 2010-05-16 NOTE — Miscellaneous (Signed)
Summary: D/C ORDER  D/C ORDER   Imported By: Lind Guest 11/08/2008 16:00:34  _____________________________________________________________________  External Attachment:    Type:   Image     Comment:   External Document

## 2010-05-16 NOTE — Progress Notes (Signed)
Summary: EYE EXAM  EYE EXAM   Imported By: Lind Guest 04/23/2009 13:41:41  _____________________________________________________________________  External Attachment:    Type:   Image     Comment:   External Document

## 2010-05-16 NOTE — Miscellaneous (Signed)
Summary: CLEARED FOR DENTAL  CLEARED FOR DENTAL   Imported By: Lind Guest 08/27/2009 12:59:08  _____________________________________________________________________  External Attachment:    Type:   Image     Comment:   External Document

## 2010-05-16 NOTE — Assessment & Plan Note (Signed)
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 Yes Pain Assessment Patient in pain? no        CC:  physical.  History of Present Illness: Pt is here today for a physical for special olympics.  He is accompanied by a caregiver.  He has last seen 1 mos ago.  He has no complaints or concerns. Appetitie is nl, no change of BM's or constipation. No chest pain or difficulty breathing.    Current Medications (verified): 1)  Omeprazole 20 Mg  Tbec (Omeprazole) .... Take One Tablet By Mouth Once A Day 2)  Simvastatin 40 Mg  Tabs (Simvastatin) .... Take One Tablet By Mouth Once A Day 3)  Aspirin 81 Mg  Tbec (Aspirin) .... Take 1 Tablet By Mouth Once A Day 4)  Citalopram Hydrobromide 40 Mg Tabs (Citalopram Hydrobromide) .... Take 1 Tablet By Mouth Once A Day 5)  Flomax 0.4 Mg Xr24h-Cap (Tamsulosin Hcl) .... Take 1 Tablet By Mouth Once A Day 6)  Benadryl Allergy Childrens 12.5 Mg Chew (Diphenhydramine Hcl) .... One Tab By Mouth Three Times A Day Prn 7)  Normal Saline Flush 0.9 % Soln (Sodium Chloride) .... Uad For Cleaning Wounds Prn 8)  Tylenol 325 Mg Tabs (Acetaminophen) .... One Tab By Mouth Two Times A Day Prn 9)  Dulcolax 5 Mg Tbec (Bisacodyl) .... One Tab By Mouth Three Times A Day Prn 10)  Geodon 80 Mg Caps (Ziprasidone Hcl) .... One Cap By Mouth At Bedtime 11)  Seroquel 100 Mg Tabs (Quetiapine Fumarate) .... Two Tabs By Mouth At Bedtime 12)  Hydrochlorothiazide 25 Mg Tabs (Hydrochlorothiazide) .... Take 1 Tablet By Mouth Once A Day 13)  Klor-Con M20 20 Meq Cr-Tabs (Potassium Chloride Crys Cr) .... Take 1 Tablet By Mouth Once A Day 14)  Accu-Chek  Compact  Strp (Glucose Blood) .... Once Daily Testing 15)  Accu-Chek Soft Touch Lancets  Misc (Lancets) .... Once Daily Testing  Allergies (verified): 1)  ! Ace Inhibitors  Past History:  Past medical, surgical, family and social histories (including risk factors) reviewed, and no changes noted (except as noted below).  Past Medical History: Reviewed history from 12/30/2007 and no changes required. Urinary Incontinence Psychotic Disorder, stable Diabetes mellitus, type II Hypertension Prostate cancer  Past Surgical History: Reviewed history from 04/23/2007 and no changes required. Hemorroidectomy- 2002  Family History: Reviewed history from 04/23/2007 and no changes required. Mother is 29 and has HTN, diabetes and mental illness. Father is deceased at age 59 with a stroke. Has no siblings.  Social History: Reviewed history from 04/23/2007 and no changes required. Patient is disabled. He is single and has no children. Denies alcohol, tobacco or street drug use.  Review of Systems General:  Denies chills and fever. ENT:  Denies nasal congestion and sore throat. CV:  Denies chest pain or discomfort. Resp:  Denies cough and shortness of breath. GI:  Denies change in bowel habits, constipation, nausea, and vomiting. MS:  Denies joint pain.  Physical Exam  General:  Well-developed,well-nourished,in no acute distress; alert,appropriate and cooperative throughout examination Head:  Normocephalic and atraumatic without obvious abnormalities. No apparent alopecia or balding. Eyes:  pupils equal, pupils round, pupils reactive to light, and no injection.   Ears:  External ear exam shows no significant lesions or deformities.  Otoscopic examination reveals clear canals, tympanic membranes are intact bilaterally without bulging, retraction, inflammation or discharge. Hearing is grossly normal bilaterally. Nose:  External nasal examination shows no deformity or inflammation. Nasal mucosa  are pink and moist without lesions or exudates. Mouth:  Oral mucosa and oropharynx without lesions or exudates.   Neck:  No deformities, masses, or tenderness noted.no thyromegaly.   Lungs:  Normal respiratory effort, chest expands symmetrically. Lungs are clear to auscultation, no crackles or wheezes. Heart:  Normal rate and regular rhythm. S1 and S2 normal without gallop, murmur, click, rub or other extra sounds. Abdomen:  Bowel sounds positive,abdomen soft and non-tender without masses, organomegaly or hernias noted. Rectal:  No external abnormalities noted. Normal sphincter tone. No rectal masses or tenderness. Prostate:  Prostate gland firm and smooth, no enlargement, nodularity, tenderness, mass, asymmetry or induration. Pulses:  R femoral normal, R posterior tibial normal, R dorsalis pedis normal, L femoral normal, L posterior tibial normal, and L dorsalis pedis normal.   Extremities:  No clubbing, cyanosis, edema, or deformity noted with normal full range of motion of all joints.   Neurologic:  DTR bilat bicep, patellar, & achilles +1 Skin:  Intact without suspicious lesions or rashes. Thickened yellowish toenails Bilat. Cervical Nodes:  No lymphadenopathy noted Psych:  normally interactive, good eye contact, not anxious appearing, and not agitated.     Impression & Recommendations:  Problem # 1:  HEALTHY ADULT MALE (ICD-V70.0)  Problem # 2:  ONYCHOMYCOSIS, TOENAILS (ICD-110.1) Assessment: Unchanged  Problem # 3:  OBESITY (ICD-278.00) Assessment: Deteriorated  Discussed with caregiver need to work on low carb diet as Dr. Lodema Hong recommended last mos. Pt states he walks for exercise.  Recommend increase in physical activity also.  Ht: 62.5 (06/15/2009)   Wt: 190.25 (06/15/2009)   BMI: 34.37 (06/15/2009)  Complete Medication List: 1)  Omeprazole 20 Mg Tbec (Omeprazole) .... Take one tablet by mouth once a day 2)  Simvastatin 40 Mg Tabs (Simvastatin) .... Take one tablet by  mouth once a day 3)  Aspirin 81 Mg Tbec (Aspirin) .... Take 1 tablet by mouth once a day 4)  Citalopram Hydrobromide 40 Mg Tabs (Citalopram hydrobromide) .... Take 1 tablet by mouth once a day 5)  Flomax 0.4 Mg Xr24h-cap (Tamsulosin hcl) .... Take 1 tablet by mouth once a day 6)  Benadryl Allergy Childrens 12.5 Mg Chew (Diphenhydramine hcl) .... One tab by mouth three times a day prn 7)  Normal Saline Flush 0.9 % Soln (Sodium chloride) .... Uad for cleaning wounds prn 8)  Tylenol 325 Mg Tabs (Acetaminophen) .... One tab by mouth two times a day prn 9)  Dulcolax 5 Mg Tbec (Bisacodyl) .... One tab by mouth three times a day prn 10)  Geodon 80 Mg Caps (Ziprasidone hcl) .... One cap by mouth at bedtime 11)  Seroquel 100 Mg Tabs (Quetiapine fumarate) .... Two tabs by mouth at bedtime 12)  Hydrochlorothiazide 25 Mg Tabs (Hydrochlorothiazide) .... Take 1 tablet by mouth once a day 13)  Klor-con M20 20 Meq Cr-tabs (Potassium chloride crys cr) .... Take 1 tablet by mouth once a day 14)  Accu-chek Compact Strp (Glucose blood) .... Once daily testing  15)  Accu-chek Soft Touch Lancets Misc (Lancets) .... Once daily testing  Other Orders: Hemoccult Guaiac-1 spec.(in office) ([id number])  Patient Instructions: 1)  Please schedule a follow-up appointment in 3 months. ( should already have appt scheduled) 2)  It is important that you exercise regularly at least 20 minutes 5 times a week. If you develop chest pain, have severe difficulty breathing, or feel very tired , stop exercising immediately and seek medical attention. 3)  You need to lose weight. Consider a lower calorie diet and regular exercise.

## 2010-05-16 NOTE — Letter (Signed)
Summary: MED REVIEW SHEET  MED REVIEW SHEET   Imported By: Rudene Anda 01/28/2010 16:28:05  _____________________________________________________________________  External Attachment:    Type:   Image     Comment:   External Document

## 2010-05-16 NOTE — Letter (Signed)
Summary: application for special olympics  application for special olympics   Imported By: Lind Guest 06/15/2009 16:22:58  _____________________________________________________________________  External Attachment:    Type:   Image     Comment:   External Document

## 2010-05-16 NOTE — Miscellaneous (Signed)
Summary: Home Care Report  Home Care Report   Imported By: Lind Guest 11/20/2009 14:37:13  _____________________________________________________________________  External Attachment:    Type:   Image     Comment:   External Document

## 2010-05-16 NOTE — Letter (Signed)
Summary: [facility]   Imported By: Lind Guest 12/06/2009 08:21:21  _____________________________________________________________________  External Attachment:    Type:   Image     Comment:   External Document

## 2010-05-16 NOTE — Op Note (Signed)
Summary: [facility] PROSTATE  [facility]   Imported By: Lind Guest 11/23/2009 15:53:44  _____________________________________________________________________  External Attachment:    Type:   Image     Comment:   External Document

## 2010-05-16 NOTE — Assessment & Plan Note (Signed)
Summary: physical   Vital Signs:  Patient profile:   60 year old male Height:      62.5 inches Weight:      185.25 pounds BMI:     33.46 O2 Sat:      95 % Pulse rate:   82 / minute Pulse rhythm:   regular Resp:     16 per minute BP sitting:   120 / 88  (left arm) Cuff size:   large  Vitals Entered By: Everitt Amber LPN (November 19, 2009 9:32 AM)  Nutrition Counseling: Patient's BMI is greater than 25 and therefore counseled on weight management options. CC: CPE   CC:  CPE.  History of Present Illness: Pt in for a cPE for dental extraction. Thgere are no concerns which are new per faciltry staaff. His mentl status is more stable now thathe is taking meds and seeing pstch regualrly. He has had no recent fever or chills. His apetite is good , and his blood sugar , when check is within a nl reference range.  Current Medications (verified): 1)  Omeprazole 20 Mg  Tbec (Omeprazole) .... Take One Tablet By Mouth Once A Day 2)  Aspirin 81 Mg  Tbec (Aspirin) .... Take 1 Tablet By Mouth Once A Day 3)  Citalopram Hydrobromide 40 Mg Tabs (Citalopram Hydrobromide) .... Take 1 Tablet By Mouth Once A Day 4)  Flomax 0.4 Mg Xr24h-Cap (Tamsulosin Hcl) .... Take 1 Tablet By Mouth Once A Day 5)  Benadryl Allergy Childrens 12.5 Mg Chew (Diphenhydramine Hcl) .... One Tab By Mouth Three Times A Day Prn 6)  Normal Saline Flush 0.9 % Soln (Sodium Chloride) .... Uad For Cleaning Wounds Prn 7)  Tylenol 325 Mg Tabs (Acetaminophen) .... One Tab By Mouth Two Times A Day Prn 8)  Dulcolax 5 Mg Tbec (Bisacodyl) .... One Tab By Mouth Three Times A Day Prn 9)  Geodon 80 Mg Caps (Ziprasidone Hcl) .... One Cap By Mouth At Bedtime 10)  Seroquel Xr 150 Mg Xr24h-Tab (Quetiapine Fumarate) .... Take 1 Tablet By Mouth Once A Day 11)  Hydrochlorothiazide 25 Mg Tabs (Hydrochlorothiazide) .... Take 1 Tablet By Mouth Once A Day 12)  Klor-Con M20 20 Meq Cr-Tabs (Potassium Chloride Crys Cr) .... Take 1 Tablet By Mouth Once A  Day 13)  Accu-Chek Compact  Strp (Glucose Blood) .... Once Daily Testing 14)  Accu-Chek Soft Touch Lancets  Misc (Lancets) .... Once Daily Testing 15)  Diphenoxylate-Atropine 2.5-0.025 Mg Tabs (Diphenoxylate-Atropine) .... As Needed  Allergies (verified): 1)  ! Ace Inhibitors  Past History:  Past Medical History: Urinary Incontinence Psychotic Disorder, stable Diabetes mellitus, type II Hypertension Prostate cancer Bilateral cataracrts  2011 Vit D deficiency  2011 mental retardation  Past Surgical History: Hemorroidectomy- 2002 TURP-July 09, 2007  Review of Systems      See HPI General:  Complains of fatigue; denies chills and fever. Eyes:  Complains of vision loss-both eyes. ENT:  Denies hoarseness, nasal congestion, sinus pressure, and sore throat. CV:  Denies chest pain or discomfort, palpitations, and swelling of feet. Resp:  Denies cough and sputum productive. GI:  Denies abdominal pain, constipation, diarrhea, nausea, and vomiting. GU:  Denies urinary frequency and urinary hesitancy. MS:  Denies joint pain and stiffness. Derm:  Denies itching and rash. Neuro:  Complains of memory loss; denies headaches, seizures, and sensation of room spinning. Psych:  Complains of mental problems; denies suicidal thoughts/plans, thoughts of violence, and unusual visions or sounds. Endo:  Denies excessive thirst  and excessive urination. Heme:  Denies abnormal bruising and bleeding. Allergy:  Denies hives or rash and itching eyes.  Physical Exam  General:  Well-developed,well-nourished,in no acute distress; alert,appropriate and cooperative throughout examination Head:  normocephalic and no abnormalities observed.   Eyes:  vision grossly intact.   Ears:  External ear exam shows no significant lesions or deformities.  Otoscopic examination reveals clear canals, tympanic membranes are intact bilaterally without bulging, retraction, inflammation or discharge. Hearing is grossly normal  bilaterally. Nose:  no external deformity, nose piercing noted, and no external erythema.   Mouth:  pharynx pink and moist, poor dentition, and teeth missing.   Neck:  No deformities, masses, or tenderness noted. Chest Wall:  No deformities, masses, tenderness or gynecomastia noted. Breasts:  No masses or gynecomastia noted Lungs:  Normal respiratory effort, chest expands symmetrically. Lungs are clear to auscultation, no crackles or wheezes. Heart:  Normal rate and regular rhythm. S1 and S2 normal without gallop, murmur, click, rub or other extra sounds. Abdomen:  Bowel sounds positive,abdomen soft and non-tender without masses, organomegaly or hernias noted. Rectal:  deferred Genitalia:  deferred Msk:  No deformity or scoliosis noted of thoracic or lumbar spine.   Pulses:  R and L carotid,radial,femoral,dorsalis pedis and posterior tibial pulses are full and equal bilaterally Extremities:  No clubbing, cyanosis, edema, or deformity noted with normal full range of motion of all joints.   Neurologic:  alert & oriented X3, cranial nerves II-XII intact, strength normal in all extremities, and sensation intact to light touch.   pt has mental retardation Skin:  Intact without suspicious lesions or rashes Cervical Nodes:  No lymphadenopathy noted Axillary Nodes:  No palpable lymphadenopathy Inguinal Nodes:  No significant adenopathy Psych:  memory impairment.     Impression & Recommendations:  Problem # 1:  OBESITY (ICD-278.00) Assessment Unchanged  Ht: 62.5 (11/19/2009)   Wt: 185.25 (11/19/2009)   BMI: 33.46 (11/19/2009)  Problem # 2:  HYPERLIPIDEMIA (ICD-272.4) Assessment: Comment Only  The following medications were removed from the medication list:    Simvastatin 40 Mg Tabs (Simvastatin) .Marland Kitchen... Take one tablet by mouth once a day  Labs Reviewed: SGOT: 52 (05/18/2009)   SGPT: 65 (05/18/2009)   HDL:61 (05/18/2009), 84 (10/27/2007)  LDL:81 (05/18/2009), 67 (10/27/2007)  Chol:163  (05/18/2009), 162 (10/27/2007)  Trig:104 (05/18/2009), 54 (10/27/2007)  Problem # 3:  HYPERTENSION (ICD-401.9) Assessment: Unchanged  His updated medication list for this problem includes:    Hydrochlorothiazide 25 Mg Tabs (Hydrochlorothiazide) .Marland Kitchen... Take 1 tablet by mouth once a day  Orders: EKG w/ Interpretation (93000)nSR , nol ischemia CXR- 2view (CXR) T-Basic Metabolic Panel ([id number])  BP today: 120/88 Prior BP: 104/80 (08/24/2009)  Labs Reviewed: K+: 4.1 (05/18/2009) Creat: : 0.92 (05/18/2009)   Chol: 163 (05/18/2009)   HDL: 61 (05/18/2009)   LDL: 81 (05/18/2009)   TG: 104 (05/18/2009)  Problem # 4:  DIABETES MELLITUS, TYPE II (ICD-250.00) Assessment: Unchanged  His updated medication list for this problem includes:    Aspirin 81 Mg Tbec (Aspirin) .Marland Kitchen... Take 1 tablet by mouth once a day  Orders: T- Hemoglobin A1C ([id number]) T- Hemoglobin A1C ([id number])  Labs Reviewed: Creat: 0.92 (05/18/2009)    Reviewed HgBA1c results: 5.8 (05/17/2009)  5.9 (01/19/2009)  Problem # 5:  UNSPECIFIED VITAMIN D DEFICIENCY (ICD-268.9) Assessment: New pt tto start weekly vit D  Complete Medication List: 1)  Omeprazole 20 Mg Tbec (Omeprazole) .... Take one tablet by mouth once a day 2)  Aspirin 81 Mg Tbec (Aspirin) .Marland KitchenMarland KitchenMarland Kitchen  Take 1 tablet by mouth once a day 3)  Citalopram Hydrobromide 40 Mg Tabs (Citalopram hydrobromide) .... Take 1 tablet by mouth once a day 4)  Flomax 0.4 Mg Xr24h-cap (Tamsulosin hcl) .... Take 1 tablet by mouth once a day 5)  Seroquel Xr 150 Mg Xr24h-tab (Quetiapine fumarate) .... Take 1 tablet by mouth once a day 6)  Hydrochlorothiazide 25 Mg Tabs (Hydrochlorothiazide) .... Take 1 tablet by mouth once a day 7)  Klor-con M20 20 Meq Cr-tabs (Potassium chloride crys cr) .... Take 1 tablet by mouth once a day 8)  Accu-chek Compact Strp (Glucose blood) .... Once daily testing 9)  Accu-chek Soft Touch Lancets Misc (Lancets) .... Once daily testing 10)   Diphenoxylate-atropine 2.5-0.025 Mg Tabs (Diphenoxylate-atropine) .... As needed 11)  Namenda 10 Mg Tabs (Memantine hcl) .... 2 tabs at bedtime 12)  Vitamin D (ergocalciferol) 50000 Unit Caps (Ergocalciferol) .... One capsule once weekly 13)  Dulcolax 5 Mg Tbec (Bisacodyl) .... One tablet every 3 days as needed for constipation 14)  Geodon 80 Mg Caps (Ziprasidone hcl) .... 2 capsules with evening meal  Other Orders: T-CBC w/Diff ([id number]) T-Hepatic Function [phone]) T-Lipid Profile ([id number])  Patient Instructions: 1)  Please schedule a follow-up appointment in 3 months. 2)  BMP prior to visit, ICD-9: 3)  CBC w/ Diff prior to visit, ICD-9:  today. 4)  hBA1C today 5)  Cxr today. 6)  You need to start vit D 50,000 units once weekly 7)  BMP prior to visit, ICD-9: 8)  Hepatic Panel prior to visit, ICD-9: 9)  Lipid Panel prior to visit, ICD-9:  fasting in 3 months 10)  HbgA1C prior to visit, ICD-9: Prescriptions: VITAMIN D (ERGOCALCIFEROL) 50000 UNIT CAPS (ERGOCALCIFEROL) one capsule once weekly  #4 x 4   Entered and Authorized by:   Syliva Overman MD   Signed by:   Syliva Overman MD on 11/19/2009   Method used:   Electronically to        [facility]* (retail)       [address]       [place], [place]  [address]       Ph: [phone]       Fax: [phone]   RxID:   [id number]

## 2010-05-16 NOTE — Miscellaneous (Signed)
Summary: HEALTHCARE APPT. SUMMARY  HEALTHCARE APPT. SUMMARY   Imported By: Lind Guest 11/23/2009 13:33:55  _____________________________________________________________________  External Attachment:    Type:   Image     Comment:   External Document

## 2010-06-04 ENCOUNTER — Ambulatory Visit (INDEPENDENT_AMBULATORY_CARE_PROVIDER_SITE_OTHER): Payer: Medicare Other | Admitting: Family Medicine

## 2010-06-04 ENCOUNTER — Encounter: Payer: Self-pay | Admitting: Family Medicine

## 2010-06-04 DIAGNOSIS — E119 Type 2 diabetes mellitus without complications: Secondary | ICD-10-CM

## 2010-06-04 DIAGNOSIS — I10 Essential (primary) hypertension: Secondary | ICD-10-CM

## 2010-06-04 DIAGNOSIS — E785 Hyperlipidemia, unspecified: Secondary | ICD-10-CM

## 2010-06-04 DIAGNOSIS — B356 Tinea cruris: Secondary | ICD-10-CM | POA: Insufficient documentation

## 2010-06-10 LAB — CONVERTED CEMR LAB
AST: 20 units/L (ref 0–37)
BUN: 16 mg/dL (ref 6–23)
Bilirubin, Direct: 0.1 mg/dL (ref 0.0–0.3)
Indirect Bilirubin: 0.4 mg/dL (ref 0.0–0.9)
Potassium: 4.2 meq/L (ref 3.5–5.3)
Sodium: 142 meq/L (ref 135–145)
TSH: 2.676 microintl units/mL (ref 0.350–4.500)
Total Bilirubin: 0.5 mg/dL (ref 0.3–1.2)

## 2010-06-11 NOTE — Assessment & Plan Note (Signed)
Summary: f up   Vital Signs:  Patient profile:   60 year old male Height:      62.5 inches Weight:      191.75 pounds BMI:     34.64 O2 Sat:      95 % Pulse rate:   91 / minute Pulse rhythm:   regular Resp:     16 per minute BP sitting:   140 / 90  (left arm) Cuff size:   large  Vitals Entered By: Everitt Amber LPN  Nutrition Counseling: Patient's BMI is greater than 25 and therefore counseled on weight management options. CC: Follow up chronic problems, has a rash between his legs   CC:  Follow up chronic problems and has a rash between his legs.  History of Present Illness: Reports  that he has been  doing well. Denies recent fever or chills. Denies sinus pressure, nasal congestion , ear pain or sore throat. Denies chest congestion, or cough productive of sputum. Denies chest pain, palpitations, PND, orthopnea or leg swelling. Denies abdominal pain, nausea, vomitting, diarrhea or constipation. Denies change in bowel movements or bloody stool. Denies dysuria , frequency, incontinence or hesitancy. Denies  joint pain, swelling, or reduced mobility. Denies headaches, vertigo, seizures. Currently stable on his mental health meds. He does c/o poor sleep however just had a dose inc on one of his meds by psych   Current Medications (verified): 1)  Omeprazole 20 Mg  Tbec (Omeprazole) .... Take One Tablet By Mouth Once A Day 2)  Aspirin 81 Mg  Tbec (Aspirin) .... Take 1 Tablet By Mouth Once A Day 3)  Citalopram Hydrobromide 40 Mg Tabs (Citalopram Hydrobromide) .... Take 1 Tablet By Mouth Once A Day 4)  Flomax 0.4 Mg Xr24h-Cap (Tamsulosin Hcl) .... Take 1 Tablet By Mouth Once A Day 5)  Seroquel Xr 400 Mg Xr24h-Tab (Quetiapine Fumarate) .... Take 1 Tablet By Mouth Once A Day 6)  Hydrochlorothiazide 25 Mg Tabs (Hydrochlorothiazide) .... Take 1 Tablet By Mouth Once A Day 7)  Klor-Con M20 20 Meq Cr-Tabs (Potassium Chloride Crys Cr) .... Take 1 Tablet By Mouth Once A Day 8)  Accu-Chek  Compact  Strp (Glucose Blood) .... Once Daily Testing 9)  Accu-Chek Soft Touch Lancets  Misc (Lancets) .... Once Daily Testing 10)  Diphenoxylate-Atropine 2.5-0.025 Mg Tabs (Diphenoxylate-Atropine) .... As Needed 11)  Namenda 10 Mg Tabs (Memantine Hcl) .Marland Kitchen.. 1 Tab in The Am and One At Bedtime 12)  Vitamin D (Ergocalciferol) 50000 Unit Caps (Ergocalciferol) .... One Capsule Once Weekly 13)  Dulcolax 5 Mg Tbec (Bisacodyl) .... One Tablet Every 3 Days As Needed For Constipation 14)  Geodon 80 Mg Caps (Ziprasidone Hcl) .... 2 Capsules With Evening Meal  Allergies (verified): 1)  ! Ace Inhibitors  Review of Systems      See HPI General:  Complains of fatigue and sleep disorder. Eyes:  Complains of vision loss-both eyes. Derm:  Complains of itching, lesion(s), and rash; rash between legs x 1 week. Psych:  Complains of mental problems; denies suicidal thoughts/plans, thoughts of violence, and thoughts /plans of harming others. Endo:  Denies cold intolerance, excessive hunger, excessive thirst, and excessive urination; fasting sugars are seldom over 110. Heme:  Denies abnormal bruising and bleeding. Allergy:  Denies hives or rash and itching eyes.  Physical Exam  General:  Well-developed,well-nourished,in no acute distress; alert,appropriate and cooperative throughout examination. Mental retardation , with difficulty expressing himself verbally HEENT: No facial asymmetry,  EOMI, No sinus tenderness, TM's Clear, oropharynx  pink and moist.   Chest: Clear to auscultation bilaterally.  CVS: S1, S2, No murmurs, No S3.   Abd: Soft, Nontender.  MS: Adequate ROM spine, hips, shoulders and knees.  Ext: No edema.   CNS: CN 2-12 intact, power tone and sensation normal throughout.   Skin: Intact, fungal infection in groin Psych: Good eye contact.  Memorylossnot anxious or depressed appearing.    Impression & Recommendations:  Problem # 1:  TINEA CRURIS (ICD-110.3) Assessment  Deteriorated  Orders: Medicare Electronic Prescription [phone])  Problem # 2:  OBESITY (ICD-278.00) Assessment: Deteriorated  Ht: 62.5 (06/04/2010)   Wt: 191.75 (06/04/2010)   BMI: 34.64 (06/04/2010) therapeutic lifestyle change discussed and encouraged  Problem # 3:  HYPERTENSION (ICD-401.9) Assessment: Deteriorated  His updated medication list for this problem includes:    Hydrochlorothiazide 25 Mg Tabs (Hydrochlorothiazide) .Marland Kitchen... Take 1 tablet by mouth once a day Patient advised to follow low sodium diet rich in fruit and vegetables, and to commit to at least 30 minutes 5 days per week of regular exercise , to improve blood presure control.   Orders: T-Basic Metabolic Panel [phone])  BP today: 140/90 Prior BP: 130/90 (01/24/2010)  Labs Reviewed: K+: 4.0 (02/25/2010) Creat: : 0.99 (02/25/2010)   Chol: 225 (02/25/2010)   HDL: 57 (02/25/2010)   LDL: 146 (02/25/2010)   TG: 109 (02/25/2010)  Problem # 4:  UNSPECIFIED PSYCHOSIS (ICD-298.9) Assessment: Improved management per mental health  Problem # 5:  DIABETES MELLITUS, TYPE II (ICD-250.00) Assessment: Comment Only  His updated medication list for this problem includes:    Aspirin 81 Mg Tbec (Aspirin) .Marland Kitchen... Take 1 tablet by mouth once a day Pt advised to reduce carbohydrate intake, espescially sweets, and to start regular physical activity, at least 30 minutes 5 days weekly, to enable weight loss, and reduce the risk of becoming diabetic once more.  Orders: T- Hemoglobin A1C ([id number])  Labs Reviewed: Creat: 0.99 (02/25/2010)    Reviewed HgBA1c results: 5.9 (02/25/2010)  5.8 (11/19/2009)  Problem # 6:  HYPERLIPIDEMIA (ICD-272.4) Assessment: Comment Only  His updated medication list for this problem includes:    Pravastatin Sodium 40 Mg Tabs (Pravastatin sodium) .Marland Kitchen... Take 1 tab by mouth at bedtime Low fat dietdiscussed and encouraged  Orders: Medicare Electronic Prescription [phone]) T-Hepatic Function  [phone])  Labs Reviewed: SGOT: 19 (02/25/2010)   SGPT: 14 (02/25/2010)   HDL:57 (02/25/2010), 61 (05/18/2009)  LDL:146 (02/25/2010), 81 (44/04/270)  Chol:225 (02/25/2010), 163 (05/18/2009)  Trig:109 (02/25/2010), 104 (05/18/2009)  Complete Medication List: 1)  Omeprazole 20 Mg Tbec (Omeprazole) .... Take one tablet by mouth once a day 2)  Aspirin 81 Mg Tbec (Aspirin) .... Take 1 tablet by mouth once a day 3)  Citalopram Hydrobromide 40 Mg Tabs (Citalopram hydrobromide) .... Take 1 tablet by mouth once a day 4)  Flomax 0.4 Mg Xr24h-cap (Tamsulosin hcl) .... Take 1 tablet by mouth once a day 5)  Seroquel Xr 400 Mg Xr24h-tab (Quetiapine fumarate) .... Take 1 tablet by mouth once a day 6)  Hydrochlorothiazide 25 Mg Tabs (Hydrochlorothiazide) .... Take 1 tablet by mouth once a day 7)  Klor-con M20 20 Meq Cr-tabs (Potassium chloride crys cr) .... Take 1 tablet by mouth once a day 8)  Accu-chek Compact Strp (Glucose blood) .... Once daily testing 9)  Accu-chek Soft Touch Lancets Misc (Lancets) .... Once daily testing 10)  Diphenoxylate-atropine 2.5-0.025 Mg Tabs (Diphenoxylate-atropine) .... As needed 11)  Namenda 10 Mg Tabs (Memantine hcl) .Marland Kitchen.. 1 tab in the am and one at  bedtime 12)  Dulcolax 5 Mg Tbec (Bisacodyl) .... One tablet every 3 days as needed for constipation 13)  Geodon 80 Mg Caps (Ziprasidone hcl) .... 2 capsules with evening meal 14)  Pravastatin Sodium 40 Mg Tabs (Pravastatin sodium) .... Take 1 tab by mouth at bedtime 15)  Clotrimazole-betamethasone 1-0.05 % Crea (Clotrimazole-betamethasone) .... Apply twice daily to rash on groin for 10 days, then as needed  Other Orders: T-TSH [phone]) T-Vitamin D (25-Hydroxy) [id number])  Patient Instructions: 1)  Please schedule a follow-up appointment in 3.5 months. 2)  It is important that you exercise regularly at least 20 minutes 5 times a week. If you develop chest pain, have severe difficulty breathing, or feel very tired  , stop exercising immediately and seek medical attention. 3)  You need to lose weight. Consider a lower calorie diet and regular exercise.  4)  Labs today. 5)  new meds one is a cream for the rash, and another is for high cholesterol Prescriptions: CLOTRIMAZOLE-BETAMETHASONE 1-0.05 % CREA (CLOTRIMAZOLE-BETAMETHASONE) apply twice daily to rash on groin for 10 days, then as needed  #45 gm x 0   Entered and Authorized by:   Syliva Overman MD   Signed by:   Syliva Overman MD on 06/04/2010   Method used:   Print then Give to Patient   RxID:   [id number] PRAVASTATIN SODIUM 40 MG TABS (PRAVASTATIN SODIUM) Take 1 tab by mouth at bedtime  #30 x 3   Entered and Authorized by:   Syliva Overman MD   Signed by:   Syliva Overman MD on 06/04/2010   Method used:   Print then Give to Patient   RxID:   [id number]    Orders Added: 1)  Est. Patient Level IV [72536] 2)  Medicare Electronic Prescription [G8553] 3)  T-Basic Metabolic Panel [[id number]] 4)  T-Hepatic Function [[id number]] 5)  T- Hemoglobin A1C [83036-23375] 6)  T-TSH [95638-75643] 7)  T-Vitamin D (25-Hydroxy) [[id number]]

## 2010-06-11 NOTE — Miscellaneous (Signed)
Summary: Home Care Report  Home Care Report   Imported By: Lind Guest 06/04/2010 16:40:41  _____________________________________________________________________  External Attachment:    Type:   Image     Comment:   External Document

## 2010-06-11 NOTE — Miscellaneous (Signed)
[facility]   Imported By: Lind Guest 06/07/2010 14:45:11  _____________________________________________________________________  External Attachment:    Type:   Image     Comment:   External Document

## 2010-08-27 NOTE — H&P (Signed)
NAME:  Xavier Tanner, Xavier Tanner              ACCOUNT NO.:  [id number]   MEDICAL RECORD NO.:  [id number]          PATIENT TYPE:  INP   LOCATION:  A329                          FACILITY:  [facility]   PHYSICIAN:  Ky Barban, M.D.DATE OF BIRTH:  1950-07-14   DATE OF ADMISSION:  08/11/2007  DATE OF DISCHARGE:  LH                              HISTORY & PHYSICAL   CHIEF COMPLAINT:  Gross total hematuria.   HISTORY:  Patient is a 60 year old gentleman, who is a patient of a  local nursing home.  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  Foley catheter was inserted.  He was admitted for further workup and  management.   PAST HISTORY:  Refer to his old records.   REVIEW OF SYSTEMS:  Unremarkable.  Patient is unable to give me any good  history.   PHYSICAL EXAMINATION:  Moderately built, not in acute distress.  Blood pressure 123/72, temperature is 97.2, pulse 69 per minute.  CENTRAL NERVOUS SYSTEM:  No gross neurological deficit.  HEAD AND NECK:  ATNC, negative.  CHEST:  Symmetrical.  HEART:  Regular sinus rhythm, no murmur.  ABDOMEN:  Soft, flat.  Liver, spleen, kidneys are not palpable.  EXTERNAL GENITALIA:  Uncircumcised meatus.  Foley catheter in place,  draining bloody urine.  RECTAL EXAM:  Deferred.  EXTREMITIES:  Normal.   IMPRESSION:  1. Secondary hemorrhage, probably from TUR of prostate.  2. Prostatic cancer.   PLAN:  Intravenous fluids.  Check his hematocrit and irrigate bladder  p.r.n.      Ky Barban, M.D.  Electronically Signed     MIJ/MEDQ  D:  08/13/2007  T:  08/13/2007  Job:  [id number]

## 2010-08-27 NOTE — H&P (Signed)
NAME:  Xavier Tanner, PITSTICK              ACCOUNT NO.:  [id number]   MEDICAL RECORD NO.:  [id number]          PATIENT TYPE:  INP   LOCATION:  A332                          FACILITY:  [facility]   PHYSICIAN:  Dennie Maizes, M.D.   DATE OF BIRTH:  16-Jun-1950   DATE OF ADMISSION:  07/30/2007  DATE OF DISCHARGE:  LH                              HISTORY & PHYSICAL   ATTENDING PHYSICIAN:  Dennie Maizes, M.D.   CHIEF COMPLAINT:  Hematuria, clot retention, post prostate surgery.   HISTORY OF PRESENT ILLNESS:  This 57-hour-old male is a patient Dr.  Jerre Simon.  He had longstanding symptoms of prostate obstruction.  He has  undergone transverse resection of prostate on July 09, 2007.  He has  done well postoperatively.  He started having severe hematuria  associated with clot retention.  He had difficulty in passing urine.  He  was seen in the office.  A Foley catheter was inserted and the patient  had continued blood stained urine and blood clots.  He was admitted to  the hospital for further care.  He denied having any fever, chills.  He  had abdominal pain at present.   PAST MEDICAL HISTORY:  BPH status post TURP on July 09, 2007.  History  of hypertension, mental retardation, depression, possible schizophrenia,  type 2 diabetes mellitus, hyperlipidemia.   MEDICATIONS:  1. Citalopram 40 mg 1 p.o. daily.  2. Geodon 40 mg one p.o. daily at bedtime.  3. Hydrochlorothiazide 12.5 mg p.o. daily.  4. K-Dur 10 mEq one p.o. daily.  5. Metformin 500 mg half a tablet every day.  6. Omeprazole 20 mg one p.o. d.  7. Simvastatin 40 mg one p.o. daily.  8. Flomax 0.4 mg one p.o. daily.   ALLERGIES:  None.   PHYSICAL EXAMINATION:  HEENT:  Normal.  NECK:  No masses.  LUNGS:  Clear to auscultation.  HEART:  Regular rate and rhythm.  No murmurs.  ABDOMEN:  Soft.  No palpable flank mass or CVA tenderness.  Suprapubic  fullness was noted.  Penis and testes are normal.   An 11 French Foley catheter was inserted  .  Bladder irrigation was done  in the office and several blood clots were removed.  Rectal examination  was not done.   IMPRESSION:  Hematuria, clot retention, post TURP.   PLAN:  1. Admit the patient to the hospital.  2. IV fluids.  3. Bladder irrigation as needed.  4. Blood transfusion as needed.  5. The patient may need cystoscopy, evacuation of blood clots and      fulguration of the prostate.      Dennie Maizes, M.D.  Electronically Signed     SK/MEDQ  D:  07/31/2007  T:  07/31/2007  Job:  [id number]

## 2010-08-27 NOTE — Consult Note (Signed)
NAME:  ISLEY, ZINNI NO.:  [id number]   MEDICAL RECORD NO.:  [id number]          PATIENT TYPE:  INP   LOCATION:  A332                          FACILITY:  [facility]   PHYSICIAN:  Skeet Latch, DO    DATE OF BIRTH:  Jun 25, 1950   DATE OF CONSULTATION:  07/31/2007  DATE OF DISCHARGE:                                 CONSULTATION   CONSULTING PHYSICIAN:  Dr. Dennie Maizes.   REASON FOR CONSULT:  Medical management.   HISTORY OF PRESENT ILLNESS:  This is a 60 year old African American male  who is status post prostate surgery.  The patient has longstanding  symptoms of prostate obstruction and underwent a transverse resection of  his prostate on July 09, 2007.  The patient has been doing fairly well.  He started having severe hematuria with associated clot retention and  problems passing urine.  He had a Foley catheter inserted as an  outpatient and he came to have hematuria and blood clots.  He was  admitted to the hospital for further care.  The patient is receiving  bladder irrigation and is being treated by urology.  We were consulted  for medical management.  The patient does have a history of diabetes and  hyperlipidemia as well as hypertension.   PAST MEDICAL HISTORY:  1. Hypertension.  2. Diabetes.  3. Mental retardation.  4. Depression.  5. Schizophrenia.   HOME MEDICATIONS:  1. Citalopram 40 mg p.o. daily.  2. Geodon 40 mg one p.o. daily.  3. Hydrochlorothiazide 12.5 mg daily.  4. K-Dur 10 mEq daily.  5. Metformin 500 mg one-half tablet every day.  6. Omeprazole 20 mg daily.  7. Simvastatin 40 mg daily.  8. Flomax 0.4 mg daily.   No known drug allergies.   REVIEW OF SYSTEMS:  Is fairly unremarkable except for genitourinary -  the patient is having hematuria.   PHYSICAL EXAMINATION:  VITAL SIGNS:  Temperature is 96.7, pulse 83,  respirations 16, blood pressure 125/78.  GENERAL:  The patient is pleasant, cooperative, in no acute distress.  HEENT:  Head is atraumatic, normocephalic.  Eyes are PERRL.  EOMI.  NECK:  Soft, supple, nontender, nondistended.  CARDIOVASCULAR:  Regular rate and rhythm.  No murmurs, rubs or gallops.  LUNGS:  Clear to auscultation bilaterally.  No rales, rhonchi or  wheezing.  ABDOMEN:  Soft, nontender, nondistended.  No rigidity or guarding.  GENITOURINARY:  The patient does have a Foley in place with slight blood  clots and hematuria.   LABORATORIES:  His PTT is 41, PT 13.4, INR 1.0.  White count is 9.3,  hemoglobin 8.5, hematocrit 25.0, platelet count is 204.  Urine is red,  cloudy, positive for glucose, positive for ketones, large amount of  blood, positive for protein, nitrate positive and leukocyte positive.   ASSESSMENT AND PLAN:  1. The patient is status post transurethral resection of the prostate      with hematuria and clot retention.  The patient is being treated by      urology.  2. History of diabetes.  Will add sliding scale insulin to his  regimen.  Check blood sugars a.c. and h.s. and adjust as needed.  3. For his hypertension.  His blood pressure is under fairly good      control at this time.  Will re-add his home medications if needed..  4. History of mental retardation and schizophrenia.  Will re-add his      home medications which includes Geodon, citalopram.  5. For his hyperlipidemia history, will repeat restart his simvastatin      40 mg p.o. daily.   Will watch the patient during his hospital stay.  Thank you for the  consultation.      Skeet Latch, DO  Electronically Signed     SM/MEDQ  D:  07/31/2007  T:  07/31/2007  Job:  [id number]

## 2010-08-27 NOTE — Op Note (Signed)
NAME:  Xavier Tanner, Xavier Tanner              ACCOUNT NO.:  [id number]   MEDICAL RECORD NO.:  [id number]          PATIENT TYPE:  INP   LOCATION:  A329                          FACILITY:  [facility]   PHYSICIAN:  Ky Barban, M.D.DATE OF BIRTH:  1950-06-19   DATE OF PROCEDURE:  DATE OF DISCHARGE:                               OPERATIVE REPORT   PREOPERATIVE DIAGNOSES:  1. Carcinoma of prostate.  2. Gross hematuria.   POSTOPERATIVE DIAGNOSES:  1. Carcinoma of prostate.  2. Bladder full with blood clots.   ANESTHESIA:  Xylocaine jelly in the urethra.   PROCEDURE IN DETAIL:  The patient is in the lithotomy position, with  usual prepped and draped,  Xylocaine jelly instilled into the urethra,  #25 cystoscope introduced into the bladder.  Bladder blood clots were  evacuated with the help of Ellik evacuator.  When all the clots came  out,  cystoscope was introduced.  Bladder was thoroughly inspected.  I  did not see any more blood clots or any active bleeding.  Cystoscope was  removed.  #24 3-way Foley catheter left in for drainage.  The drainage  was clear.  The patient left the operating room in satisfactory  condition.      Ky Barban, M.D.  Electronically Signed     MIJ/MEDQ  D:  08/13/2007  T:  08/14/2007  Job:  [id number]

## 2010-08-27 NOTE — H&P (Signed)
NAME:  Xavier Tanner, Xavier Tanner NO.:  [id number]   MEDICAL RECORD NO.:  [id number]         PATIENT TYPE:  PAMB   LOCATION:  DAY                           FACILITY:  [facility]   PHYSICIAN:  Ky Barban, M.D.DATE OF BIRTH:  1951-02-24   DATE OF ADMISSION:  DATE OF DISCHARGE:  LH                              HISTORY & PHYSICAL   CHIEF COMPLAINT:  Symptoms of  prostatism.   HISTORY:  This gentleman, who is 60 years old, has long-standing history  of prostatism.  Couple of years ago he was seen by me and he was advised  to undergo treatment for his BPH.  I have not seen him since and now he  has become more symptomatic, has positive urine culture.  He had a  workup done again.  Flow study shows his peak flow rate is slow.  It is  only 63.  Average flow rate is 4 cc per second..  He has developed  intravesical median lobe causing bladder neck obstruction that is a  significant percent of urine.  I have put him on Flomax but it is not  helping him.  Then we discussed about doing a TU prostate.  He agrees so  he is being brought as outpatient to undergo TU prostate.  I have  discussed procedure limitations, complications, with him and he  understands.  Wants me to go ahead and put a seed.   PAST MEDICAL HISTORY:  Negative.   FAMILY HISTORY:  No history of prostate cancer.   PERSONAL HISTORY:  Does not smoke or drink.   REVIEW OF SYSTEMS:  Otherwise unremarkable.   EXAMINATION:  Moderately built.  Not in acute distress.  Fully  conscious, alert, oriented.  On examination, blood pressure 130/80.  Temperature is normal.  CENTRAL NERVOUS SYSTEM:  No gross neurological deficit.  HEAD/NECK:  Negative.  CHEST:  Heart regular sinus rhythm.  No murmur.  ABDOMEN:  Soft, flat.  Liver, spleen, kidneys are nonpalpable.  No  __________  EXTERNAL GENITALIA:  Uncircumcised.  Meatus __________.  Testicles are  normal.  RECTAL EXAM: : Prostate is 1-1/2+, smooth and firm.    IMPRESSION:  1. 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  2. Type 2 diabetes.  3. Hypertension.  4. Psychotic disorder.  5. Urinary incontinence.   PLAN:  TU prostate under anesthesia, then admit him in the hospital.      Ky Barban, M.D.  Electronically Signed     MIJ/MEDQ  D:  07/08/2007  T:  07/08/2007  Job:  [id number]

## 2010-08-27 NOTE — Op Note (Signed)
NAME:  Xavier Tanner, Xavier Tanner              ACCOUNT NO.:  [id number]   MEDICAL RECORD NO.:  [id number]          PATIENT TYPE:  INP   LOCATION:  A328                          FACILITY:  [facility]   PHYSICIAN:  Ky Barban, M.D.DATE OF BIRTH:  Nov 14, 1950   DATE OF PROCEDURE:  07/09/2007  DATE OF DISCHARGE:                               OPERATIVE REPORT   PREOPERATIVE DIAGNOSIS:  Benign prostatic hyperplasia.   POSTOPERATIVE DIAGNOSIS:  Benign prostatic hyperplasia.   PROCEDURE:  TUR prostate.   ANESTHESIA:  Spinal plus general.   SURGEON:  Ky Barban, M.D.   PROCEDURE IN DETAIL:  The patient was given general anesthesia in  lithotomy position with usual prep and drape.  A #28 Iglesias  resectoscope was introduced into the bladder.  It is inspected.  He has  a median lobe which was resected to the level of the verumontanum then  bladder neck was circumferentially dissected down to the circular  fibers.  The right lobe was resected between 11 and 7 o'clock position.  Left lobe was resected between 1 and 5 o'clock position.  Posterior  midline tissue was resected along with the apical tissue very carefully  not to injure the sphincter of the verumontanum.  At the end the  anterior midline tissue which was minimal was resected.  Chips were  evacuated.  Bleeders were coagulated.  Prostatic urethra looks wide  open.  The resectoscope was removed and a 22 three-way Foley catheter  left in for drainage.  The patient left the operating room in  satisfactory condition.      Ky Barban, M.D.  Electronically Signed     MIJ/MEDQ  D:  07/09/2007  T:  07/10/2007  Job:  [id number]

## 2010-08-27 NOTE — Op Note (Signed)
NAME:  Xavier Tanner, Xavier Tanner              ACCOUNT NO.:  [id number]   MEDICAL RECORD NO.:  [id number]          PATIENT TYPE:  INP   LOCATION:  A332                          FACILITY:  [facility]   PHYSICIAN:  Dennie Maizes, M.D.   DATE OF BIRTH:  Mar 27, 1951   DATE OF PROCEDURE:  07/31/2007  DATE OF DISCHARGE:                               OPERATIVE REPORT   PREOPERATIVE DIAGNOSES:  Hematuria, post transurethral resection of the  prostate, and clot retention.   POSTOPERATIVE DIAGNOSES:  Hematuria, post transurethral resection of the  prostate, and clot retention.   OPERATIVE PROCEDURE:  Cystoscopy, evacuation of blood clots, and  fulguration of the prostate.   ANESTHESIA:  Spinal.   SURGEON:  Dennie Maizes, MD.   COMPLICATIONS:  None.   ESTIMATED BLOOD LOSS:  No fresh bleeding.  The patient had 500 mL of  blood clots in the bladder.   SPECIMEN:  None.   DRAINS:  A 22-French triple-lumen Foley catheter with 30 mL balloon in  the bladder.   INDICATIONS FOR THE PROCEDURE:  This 60 year old male has undergone  transurethral resection of the prostate by Dr. Jerre Simon about 3 weeks ago.  He came to the office with hematuria and clot retention.  Foley catheter  was inserted and the bladder irrigations were done.  It was difficult to  remove all the blood clots through the Foley catheter.  The patient was  brought to the operating room today for cystoscopy, evacuation of blood  clots, and fulguration of the prostate.   DESCRIPTION OF PROCEDURE:  Spinal anesthesia was induced, and the  patient was placed on the OR table in the dorsal lithotomy position.  The indwelling Foley catheter was removed.  The lower abdomen and  genitalia were prepped and draped in a sterile fashion.  Cystoscopy was  done with a 25-French scope.  The urethra was normal.  There was  evidence of previous TURP and there was mild bleeding from the prostatic  fossa.  The bladder was found to be full of blood clots.  The  cystoscope  was removed.   A 28-French Iglesias resectoscope with continuous bladder irrigation was  then inserted into the bladder.  Irrigation of the bladder was done with  an Ellik evacuator and several large blood clots were removed.  The  total size of the blood clots was about 500 mL.  It took about 15  minutes to get all the blood clots out.  Examination of the bladder was  unremarkable.  The prostatic fossa was then examined and fulguration of  the bleeding points were done.  There was no active bleeding at this  time.  The instruments were removed.  A 22-French triple-lumen Foley  catheter with  30 mL balloon was inserted into the bladder.  Continuous bladder  irrigation was started and the returns were clear.  The volume of the  blood clots in the bladder was about 500 mL.  There was no active  bleeding during the procedure.  The patient was then transferred to the  PACU in a satisfactory condition.       Sethu  Rito Ehrlich, M.D.  Electronically Signed     SK/MEDQ  D:  07/31/2007  T:  07/31/2007  Job:  [id number]   cc:   Dr. Lodema Hong

## 2010-08-27 NOTE — H&P (Signed)
NAME:  Xavier Tanner, Xavier Tanner NO.:  [id number]   MEDICAL RECORD NO.:  [id number]          PATIENT TYPE:  AMB   LOCATION:  DAY                           FACILITY:  [facility]   PHYSICIAN:  Dalia Heading, M.D.  DATE OF BIRTH:  11/02/1950   DATE OF ADMISSION:  DATE OF DISCHARGE:  LH                              HISTORY & PHYSICAL   HISTORY AND PHYSICAL EXAMINATION   CHIEF COMPLAINT:  Weight loss of unknown etiology.   HISTORY OF PRESENT ILLNESS:  Patient is a 60 year old, Black male who  was referred for endoscopic evaluation.  He needs a colonoscopy and an  EGD for workup of weight loss of unknown etiology.  The patient is  mentally deficient, so the history is limited.  No abdominal pain,  weight loss, nausea, vomiting, diarrhea, constipation, melena or  hematochezia have been noted.  It is unknown whether patient has had a  colonoscopy in the past.   PAST MEDICAL HISTORY:  1. Hypertension.  2. Mental disorder.  3. Noninsulin-dependent diabetes mellitus.   PAST SURGICAL HISTORY:  Unremarkable.   CURRENT MEDICATIONS:  1. Omeprazole.  2. Metformin.  3. Simvastatin.  4. Citalopram.  5. Geodon.  6. Hydrochlorothiazide.  7. Potassium supplements.   ALLERGIES:  No known drug allergies.   REVIEW OF SYSTEMS:  Noncontributory.   PHYSICAL EXAMINATION:  GENERAL:     Patient is a well-developed, well-  nourished, Black male in no acute distress.  LUNGS:  Clear to auscultation with equal breath sounds bilaterally.  HEART EXAMINATION:  Reveals regular rate and rhythm without S3, S4  murmurs.  ABDOMEN:  Soft, nontender, nondistended.  No hepatosplenomegaly or  masses are noted.  Rectal examination was deferred to the procedure.   IMPRESSION:  Weight loss of unknown etiology.   PLAN:  The patient is scheduled for an EGD and colonoscopy on April 27, 2007.  The risks and benefits of the procedure including bleeding  and perforation were fully explained to the patient,  gave informed  consent.      Dalia Heading, M.D.  Electronically Signed     MAJ/MEDQ  D:  04/22/2007  T:  04/22/2007  Job:  [id number]

## 2010-08-30 NOTE — Discharge Summary (Signed)
NAME:  Xavier Tanner, Xavier Tanner              ACCOUNT NO.:  [id number]   MEDICAL RECORD NO.:  [id number]          PATIENT TYPE:  INP   LOCATION:  A332                          FACILITY:  [facility]   PHYSICIAN:  Dennie Maizes, M.D.   DATE OF BIRTH:  05/31/1950   DATE OF ADMISSION:  07/30/2007  DATE OF DISCHARGE:  04/20/2009LH                               DISCHARGE SUMMARY   CONSULTING PHYSICIAN:  Dr. Skeet Latch.   FINAL DIAGNOSES:  1. Hematuria.  2. Clot retention.  3. Post transurethral resection of prostate.  4. Anemia due to blood loss.   SECONDARY DIAGNOSES:  1. Hypertension.  2. Mental retardation.  3. Depression.  4. Schizophrenia.  5. Type 2 diabetes mellitus.  6. Hyperlipidemia.   OPERATIVE PROCEDURES:  Cystoscopy, evacuation of blood clots, and  fulguration of prostate done on July 31, 2007.   COMPLICATIONS:  None.   DISCHARGE SUMMARY:  This 60 year old male, he is a patient of Dr.  Isabel Caprice.  He had longstanding symptoms of prostate obstruction.  He has  undergone transurethral resection of prostate on July 09, 2007.  He had  done well postoperatively.  He started having severe hematuria as well  as the clot retention.  He had difficulty with passing urine.  He was  seen in the office.  A Foley catheter inserted and the patient had  continued to have hematuria as well as passing blood clots.  He was  admitted to the hospital for further care.  He did not have any fever or  chills.  He had abdominal pain at that time of admission.   PAST MEDICAL HISTORY:  1. BPH, status post TURP in July 09, 2007.  2. History of hypertension.  3. Mental retardation.  4. Depression.  5. Possible schizophrenia.  6. Type 2 diabetes mellitus.  7. Hyperlipidemia.   MEDICATIONS:  1. Citalopram 40 mg 1 p.o. daily.  2. Geodon 40 mg 1 p.o. every night.  3. Hydrochlorothiazide 12.5 mg 1 p.o. daily.  4. K-Dur 10  mEq 1 p.o. daily.  5. Metformin 500 mg half tablet every day.  6. Omeprazole  20 mg 1 p.o. daily.  7. Simvastatin 40 mg 1 p.o. daily.  8. Flomax 0.4 mg 1 p.o. daily.   ALLERGIES:  None.   PHYSICAL EXAMINATION:  HEAD, EYES, EARS, NOSE, AND THROAT:  Normal.  NECK:  No masses.  LUNGS:  Clear to auscultation.  HEART:  Regular rate and rhythm.  No murmurs.  ABDOMEN:  Soft.  No palpable flank mass or costovertebral angle  tenderness.  Suprapubic fullness was noted.  GENITOURINARY:  Penis and testes are normal.  An 18-French Foley  catheter was inserted.  Bladder irrigation was done in the office and  several blood clots were removed.  RECTAL:  Rectal examination was not done.   HOSPITAL COURSE:  Admission labs; CBC, WBC 9.3, hemoglobin 8.3, and  hematocrit 25.0.  BUN 51 and creatinine 1.01.  Two units of packed red  blood cells was transfused to correct anemia.  The patient was taken to  the OR on July 31, 2007.  Cystoscopy evacuation  of blood clots as well  as fulguration of prostate were done.  The patient did well in the  postoperative period.  His hematuria cleared.  The Foley catheter was  removed and the patient was voiding well.  The urine culture revealed no  growth.  Post transfusion, the hemoglobin was 11.1 and hematocrit 32.2.  The patient was seen by Dr. Lilian Kapur and managed for his medical  problems.   The patient was discharged and sent home on August 02, 2007.  He was  advised to continue his regular medications.  He was also given Cipro  500 mg p.o. b.i.d., and Percocet 5/325 1 p.o. q.8 h. p.r.n. pain #20.   He will be reviewed in the office by Dr. Jerre Simon in 2 weeks.  The patient  was advised to call me for fever, chills, voiding difficulty, or  recurrent hematuria.   Condition of the patient at the time of discharge was stable.      Dennie Maizes, M.D.  Electronically Signed     SK/MEDQ  D:  08/28/2007  T:  08/29/2007  Job:  [id number]   cc:   Dr. Jerre Simon

## 2010-08-30 NOTE — Op Note (Signed)
[facility]  Patient:    Xavier Tanner, Xavier Tanner Visit Number: [id number] MRN: [id number]          Service Type: OBV Location: 3A A332 01 Attending Physician:  Dessa Phi Dictated by:   Elpidio Anis, M.D. Admit Date:  08/06/2001 Discharge Date: 08/07/2001                             Operative Report  PREOPERATIVE DIAGNOSIS:  Hemorrhoid disease.  POSTOPERATIVE DIAGNOSIS:  Hemorrhoid disease.  OPERATION/PROCEDURE:  Hemorrhoidectomy.  SURGEON: Elpidio Anis, M.D.  DESCRIPTION OF PROCEDURE:  Under spinal anesthesia in the prone position the perianal area was prepped and draped as a sterile field.  Digital examination was carried out and four large complexes of hemorrhoids were noted.  An operating anoscope was placed.  There were 2 posterior groups.  These included very large internal complexes and large external complexes.  The apex of each group was suture ligated with 2 ligatures of 2-0 chromic.  The hemorrhoidal complex was then excised without difficulty taking care to spare the internal and external sphincter. Hemostasis was achieved.  The mucosa was then reapproximated using running locking 2-0 chromic.  This was done in both areas.  There was a left lateral complex which was treated in a similar manner.  There was a large anterior complex that was doubly ligated at the apex, excised and controlled with ligature of 2-0 chromic.  The patient tolerated the procedure well.  Sponge counts were correct.  He was transferred to a bed and taken to the postanesthetic area for monitoring. Dictated by:   Elpidio Anis, M.D. Attending Physician:  Dessa Phi DD:  08/06/01 TD:  08/07/01 Job: [id number]

## 2010-08-30 NOTE — Discharge Summary (Signed)
NAME:  Xavier Tanner, Xavier Tanner              ACCOUNT NO.:  [id number]   MEDICAL RECORD NO.:  [id number]          PATIENT TYPE:  INP   LOCATION:  A329                          FACILITY:  [facility]   PHYSICIAN:  Ky Barban, M.D.DATE OF BIRTH:  08/19/1950   DATE OF ADMISSION:  08/12/2007  DATE OF DISCHARGE:  05/02/2009LH                               DISCHARGE SUMMARY   A 60 year old gentleman underwent TUR prostate about a month ago.  He is  found to have prostate cancer and he started with gross hematuria.  Hematocrit dropped to 22%, so he was brought in, we gave hm 2 units of  blood.  Foley catheter inserted and he irrigated his Foley catheter.  He  had a lot of blood clots so the next I decided to take him to the OR.  Cystoscopy was done, large amount of clots were removed.  Now the urine  is grossly clear.  He was given almost 4 units of blood total during  this hospital stay.   LABORATORY DATA:  Sodium 139, potassium was 3.2, chloride 105, CO2 is  25, glucose 98, BUN is 19, and creatinine is 1.15.  His PTT was high it  was 41.  PT is 14.  INR is 1.1.  His hematocrit stabilized after 4 units  of blood.  His urine culture also showed no growth.  On Aug 14, 2007, his  hematocrit is 38%.  On August 12, 2007, it was 22.9%.  After 4 units of  blood, it has stabilized at 38%.  Foley catheter was taken out and he is  voiding fine with good stream.  I will see him back in the office for  followup.   FINAL DISCHARGE DIAGNOSES:  1. Secondary hemorrhage anterior prostate.  2. Prostate cancer.  3. Hypertension.  4. Non-insulin-dependent diabetes.   He is advised to continue his usual medication.      Ky Barban, M.D.  Electronically Signed     MIJ/MEDQ  D:  09/03/2007  T:  09/04/2007  Job:  [id number]

## 2010-08-30 NOTE — H&P (Signed)
NAME:  Xavier Tanner, Xavier Tanner NO.:  [id number]   MEDICAL RECORD NO.:  [id number]          PATIENT TYPE:  INP   LOCATION:  A211                          FACILITY:  [facility]   PHYSICIAN:  Osvaldo Shipper, MD     DATE OF BIRTH:  1950-10-15   DATE OF ADMISSION:  07/21/2006  DATE OF DISCHARGE:  LH                              HISTORY & PHYSICAL   PRIMARY CARE PHYSICIAN:  Dr. Syliva Overman.   ADMITTING DIAGNOSES:  1. Altered mental status likely related to unintentional drug      overdose.  2. Possible urinary tract infection.  3. History of mental retardation.  4. History of schizophrenia and depression.  5. History of type 2 diabetes.   CHIEF COMPLAINT:  Unresponsiveness.   HISTORY OF PRESENT ILLNESS:  Patient is a 60 year old African-American  male who has medical problems as stated above who also has overactive  bladder who apparently was found lying by the side of the road and his  clothes were found to be wet and he was not responsive.  When EMS went  to check on him he was found to have a blood pressure of 96/58, heart  rate was in the 70's, respiratory rate was 16, saturating 97%;  unfortunately, they did not check a temperature at that time.  Patient  was brought in to the ED where he continued to be unresponsive for most  part.  Currently patient is able to talk to me and answer some questions  but he keeps his eyes closed.  He goes right back to sleep when I stop  talking.  Does not complain of any pain at this point.  He does not have  any other complaints when I try to ask him any questions.  He was able  to tell me that he was at [facility] but was unable to give me  the year or the date.   According to the patient's mother and the nursing aide, apparently he  took 3 days worth of his Geodon and Ambien last night.   MEDICATIONS AT HOME:  We just got a list from PMD's office, and he is  apparently on the following:  1. Ditropan XL 10 mg daily.  2. Klor-Con 20 mEq daily.  3. Geodon 80 mg q.h.s.  4. Actos 45 mg daily.  5. Ambien 10 mg q.h.s.  6. Simvastatin 40 mg daily.  7. Citalopram 20 mg daily.  8. Metformin 1000 mg daily.  9. Omeprazole 20 mg daily.  10.Diovan HCT 160/25 once a day.   ALLERGIES:  NO KNOWN DRUG ALLERGIES.   PAST MEDICAL HISTORY:  1. Hypertension.  2. Diabetes.  3. Mental retardation.  4. Depression.  5. Schizophrenia.   These are all elicited from his older records.  He probably also has  overactive bladder.  He has also dyslipidemia, obesity.   SURGICAL HISTORY:  Unknown.  He has got a history of elevated PSA.   SOCIAL HISTORY:  Lives apparently with his father and his aunt, but he  also has a mother, so again social history is not very  clear at this  point.   FAMILY HISTORY:  1. History of metastatic breast cancer.  2. Chronic medical issues in his father.   Otherwise, the rest of the history is not very clear at this point.   REVIEW OF SYSTEMS:  Unable to do because of the patient's  mental  status.   PHYSICAL EXAMINATION:  VITAL SIGNS:  In the ED he was found to have a  temperature of 98.3, which is, I think this was an older temperature,  here in the floor it is 96.1, heart rate 60's to 50's, sinus, blood  pressure was 101/57 on presentation, dropping down to 84/51, currently  92/56 after IV bolus, saturation 97-99%on room air, his respiratory rate  is 18.  GENERAL:  Overweight individual, somnolent, easily arousable but keeps  his eyes closed, appears to be in no distress.  HEENT:  There is no pallor, no icterus.  Unable to examine the pupil at  this point because patient is not letting me.  Mucous membrane is moist,  tongue is midline, no oral lesions are noted.  NECK:  Soft and supple, I do not appreciate any meningitic findings.  LUNGS:  Clear to auscultation bilaterally anteriorly.  CARDIOVASCULAR:  S1, S2 is normal, regular, no murmurs appreciated.  ABDOMEN:  Soft, nontender,  nondistended.  Bowel sounds are present.  No  mass or organomegaly is appreciated.  EXTREMITIES:  Without edema, peripheral pulses are palpable.  NEUROLOGIC:  The patient is somnolent, arousable, oriented to place, not  to time, person unclear.  I do not appreciate any focal neurological  deficits, I am not able to see the pupils very clearly but they appear  to be small.   LAB DATA:  His CBC reveals a hemoglobin of 12.7, MCV 77,  platelet count  is 294,000, white count is 6.2.  Differential is normal.  CMET revealed  a glucose of 124, otherwise unremarkable.  Lactic acid was normal.  Cardiac markers normal.  Acetaminophen less than 10, alcohol less than  5, urine drug screen negative.  A UA showed small leukocytes, 11-20  WBCs, a few bacteria.  Blood cultures pending.  Urine cultures pending.  He did have a CAT scan of his head which shows chronic sinusitis but  nothing acute.  He also had a chest x-ray, portable, which showed  suboptimal inspiration with volume loss on the right side, PA and  lateral was recommended.   ASSESSMENT AND PLAN:  This is a 59 year old African-American male with  medical problems as stated earlier who comes in with altered mental  status.  Possibilities for his current situation includes unintentional  drug overdose.  Apparently he took 3 days worth of his Geodon and Ambien  which could cause this.  He was found to be lying outside the home by  the side of the road; hence, hypothermia, cold exposure also need to be  considered, although his temperature is not that low.  Infectious  etiology is also possible considering the urinary tract infection.  I do  not suspect meningitis at this point.  I think this is all related to  his drugs.  No other metabolic derangements have been noticed on his  blood work.   Plan:  1. Altered mental status.  Will continue to follow the patient and     monitor him on telemetry floor.  We will repeat cardiac panel,       repeat EKG.  His EKG of note did show some nonspecific changes.  We      will order EKG as well.  I do not have any reason to suspect acute      coronary syndrome at this point.  Will give him antibiotics for the      possible urinary tract infection.  Will hold off most of his      medications and then add them back as he improves.  2. Hypotension.  Will give him some fluid boluses with normal saline      and try to get the blood pressure up.  Will hold off on his      antihypertensives for now.  3. For his diabetes, CBGs q.a.c. and h.s. will be checked.  Will hold      off on his Actos and metformin, just cover him with sliding scale.      Deep vein thrombosis, GI prophylaxis will be initiated.  Will      continue Foley until he wakes up and then we will may discontinue      it.   Patient is a Full Code at this time.   Further management decision will be based on results of initial testing  and patient's response to treatment.      Osvaldo Shipper, MD  Electronically Signed     GK/MEDQ  D:  07/21/2006  T:  07/21/2006  Job:  [id number]   cc:   Milus Mallick. Lodema Hong, M.D.  Fax: [phone]

## 2010-08-30 NOTE — Discharge Summary (Signed)
NAME:  Xavier Tanner, Xavier Tanner NO.:  [id number]   MEDICAL RECORD NO.:  [id number]          PATIENT TYPE:  INP   LOCATION:  A211                          FACILITY:  [facility]   PHYSICIAN:  Osvaldo Shipper, MD     DATE OF BIRTH:  Oct 29, 1950   DATE OF ADMISSION:  07/21/2006  DATE OF DISCHARGE:  04/10/2008LH                               DISCHARGE SUMMARY   Please review History and Physical dictated at the time of admission for  details regarding patient's presenting illness.   Patient's primary medical doctor is Dr. Syliva Overman.   DISCHARGE DIAGNOSES:  1. Altered mental status secondary to overmedication, improved.  2. Urinary tract infection, treated.  3. Hypothermia related to environmental exposure, resolved.  4. History of schizophrenia and depression, stable.  5. Hypotension, resolved.  6. History of type 2 diabetes and hypertension.   BRIEF HOSPITAL COURSE:  Briefly, this is a 60 year old African-American  male who has mental retardation, schizophrenia, diabetes, hypertension  who is not very communicative at baseline, who was brought in by EMS  because he was found lying by the side of the road and was unresponsive.  In the ED he was found to be hypotensive and was difficult to arouse.  Patient underwent workup which included a CAT scan of his head which was  unremarkable.  Chest x-ray showed some possible atelectasis; otherwise  unremarkable.  His blood work revealed mild microcytic anemia and a  possibility of a UTI.  Urine drug screen was negative.  Acetaminophen  level less than 10.  Cardiac panels were negative.  It appeared,  according to some of his family members, that the patient may have taken  three days worth of Geodon and Ambien at one time.  This is a possible  reason for his altered mental status.  The patient was observed in the  hospital and he slowly improved, and today he appears to be close to  baseline.   He was also hypothermic, possibly  because he was lying outside in the  cold.  His rectal temperature went down to about 94 degrees Fahrenheit.  He required some warming blankets with which his temperature came up and  for the past 24 hours he has been about 97 degrees Fahrenheit without  any warming devices.  Since there was a possibility that his UTI might  have caused some sepsis which may have caused hypothermia, he was  treated with Levaquin and he has responded well to that as well.  He was  also hypotensive, as mentioned above, which responded to fluid boluses  and the antibiotics as well.  I am holding his antihypertensive  medication until he follows up with his PMD, at which point they may be  restarted if indicated.   For his diabetes, we held his metformin and Actos as his blood sugars  were running borderline low.  We started his diet yesterday and his  blood sugars are now between 100 and 120.  I am going to restart his  metformin, and as far as his Actos is concerned, I will defer to the PMD  once again.   He does have mental retardation, schizophrenia and depression at  baseline.  Hence, he is not very communicative.  He would answer  questions in monosyllables.  He appears to be in no distress today.  He  is tolerating a p.o. intake and hence is considered stable to be  discharged.   The Child psychotherapist and the Case Production designer, theatre/television/film informed me that the patient's  Case Worker at home was trying to obtain placement for him in a group  home because he is not considered to be able to take care of himself.  He does have some family members at home who were able to provide some  kind of support to him, but it is felt that he may benefit from staying  in a group home.  Our Social Worker is trying to get him placed today in  a group home.  If they are not able to get a bed today or a place today,  I think he may be able to go home with Home Health and then once they  get a place in a group home, he may go from home to  that group home.   DISCHARGE MEDICATIONS:  Levaquin 500 mg daily for two more days.  He has  been asked to resume his Ditropan XL 10 mg daily, Geodon 80 mg q.h.s.,  Ambien 10 mg q.h.s., Simvastatin 40 mg daily, Citalopram 20 mg daily,  metformin 1000 mg daily, omeprazole 20 mg daily.   I am going to have him hold his Actos, his Diovan HCT and his Klor-Con  until he follows up with his PMD.   Follow-up appointment with PMD in 1-2 weeks to check his blood pressure  and his blood sugar especially.  Home Health will also need to do this  at home.   DIET:  Modified carbohydrate diet medium.   PHYSICAL ACTIVITY:  He may need some assistance with walking.  Apparently he does not have his glasses here, which is somewhat  impairing his ability to ambulate independently.   No consultations obtained. Imaging studies discussed earlier.   Total time at discharge about 40 minutes.      Osvaldo Shipper, MD  Electronically Signed     GK/MEDQ  D:  07/23/2006  T:  07/23/2006  Job:  [id number]   cc:   Milus Mallick. Lodema Hong, M.D.  Fax: [phone]

## 2010-08-30 NOTE — Discharge Summary (Signed)
NAME:  Xavier Tanner, Xavier Tanner NO.:  [id number]   MEDICAL RECORD NO.:  [id number]          PATIENT TYPE:  INP   LOCATION:  A328                          FACILITY:  [facility]   PHYSICIAN:  Ky Barban, M.D.DATE OF BIRTH:  Dec 01, 1950   DATE OF ADMISSION:  07/09/2007  DATE OF DISCHARGE:  03/29/2009LH                               DISCHARGE SUMMARY   A 60 year old gentleman who is a resident of local rest home, has  history of prostatism for a long time.  He has been on medical treatment  with Flomax for a long time but apparently it is not helping, Continued  to have symptoms, had positive urine culture.  His flow rate is very  slow, 4 cc per second.  Cystoscopy shows enlarged prostate causing  bladder neck obstruction.  So, I have advised him to undergo TUR of the  prostate for which he underwent preop workup as outpatient.  Routine  workup, CBC, urinalysis, Astra-7, EKG, and chest x-ray are normal.  He  was taken to the operating room and underwent TUR of prostate.  Postop  course was benign.  On first postop day, he was afebrile.  Blood  pressure 146/81, temperature 98.2.  Sodium 135, potassium 3.8, chloride  103, CO2 is 27, BUN is 8, and creatinine is 0.87.  So, I discontinued  CBI and started him on regular diet out of bed.  Second postop day, he  was afebrile, doing well.  Foley catheter was taken out and he was  voiding fine.  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  Gleason score was 3 + 3 = 6.  It  involves less than 5% of the histologically evaluated tissue.   DISCHARGE MEDICATIONS:  Continue his preoperative medication which is  omeprazole, metformin, simvastatin, citalopram, hydrochlorothiazide, and  potassium.   FINAL DISCHARGE DIAGNOSIS:  Cancer of prostate.   DISCHARGE CONDITION:  Improved.   I will see him back in the office in 2 weeks.      Ky Barban, M.D.  Electronically Signed     MIJ/MEDQ  D:  08/15/2007  T:  08/16/2007  Job:  [id number]

## 2010-08-30 NOTE — H&P (Signed)
NAME:  Xavier Tanner, Xavier Tanner NO.:  [id number]   MEDICAL RECORD NO.:  [id number]          PATIENT TYPE:  INP   LOCATION:  A308                          FACILITY:  [facility]   PHYSICIAN:  Osvaldo Shipper, MD     DATE OF BIRTH:  11/27/1950   DATE OF ADMISSION:  05/20/2006  DATE OF DISCHARGE:  02/06/2008LH                              HISTORY & PHYSICAL   The patient's medical doctor is Dr. Syliva Overman.  The patient is  supposed to follow with the nephrologist, Dr. Kristian Covey.   CHIEF COMPLAINT:  Possible acute renal failure.   HISTORY OF PRESENT ILLNESS:  The patient is a 60 year old African-  American male, who apparently has some kind of cognitive impairment.  He  has depression.  He possibly even has schizophrenia, maybe even has some  mental retardation.  He was actually at Dr. Susa Griffins office today,  where he was referred by his PMD for an elevated BUN/creatinine which  was detected on April 29, 2006.  His BUN was about 70, his creatinine  was in the 3 range.  Dr. Kristian Covey saw him in the office and he felt like  the patient needed to go to the ED to undergo some blood work and maybe  ultrasound.  However, there was some miscommunication and the patient  was admitted directly to the third floor, as there was confusion whether  Dr. Kristian Covey was admitting the patient or not.  In any case, it later  transpired that Dr. Kristian Covey had just planned to send the patient to the  ED and he had actually discussed this case with Dr. Colon Branch in the ED.  In any case, we were called, as the patient had no physician while he  was on the floor.  The patient was completely asymptomatic.  He said he  had no shortness of breath, chest pains.  He did admit to some vague  upper abdominal discomfort, which has been ongoing for a few weeks.  Otherwise, he has been passing urine well, has not noticed any  difficulties with that.  Denied any lower extremity edema.  Please note  that the  patient is a very poor historian because of his mental  retardation.  The patient also mentioned some left-sided back pain  because he said he fell today, but he has been able to ambulate with no  difficulties.   MEDICATIONS AT HOME:  I do have a list here, which states the following.  1. Geodon 80 mg q.h.s.  2. Potassium chloride 20 mEq once daily.  3. Citalopram 20 mg once daily.  4. Metformin 1000 mg once daily.  5. Actos 45 mg once daily.  6. Diovan/HCT 160/25 once daily.  7. Oxybutynin 10 mg once daily.  8. Simvastatin 40 mg q.h.s.  9. Ambien 10 mg q.h.s.   ALLERGIES:  There are no known drug allergies.   PAST MEDICAL HISTORY:  Significant for hypertension, diabetes, mental  retardation, depression, possible schizophrenia.  Otherwise, no other  history is known.   SOCIAL HISTORY:  The patient lives with his father and his aunt.  No  smoking use is known.   FAMILY HISTORY:  His aunt has metastatic breast cancer.  His father also  has chronic medical issues, which the patient does not know.   REVIEW OF SYSTEMS:  Review of systems was attempted, but could not be  done because of the patient's mental status.   PHYSICAL EXAMINATION:  VITAL SIGNS:  Stable.  The patient was in no  distress, no discomfort.  LUNGS:  Clear to auscultation bilaterally.  CARDIOVASCULAR:  S1, S2 were normal, regular, and no murmurs were  appreciated.  ABDOMEN:  Reveals some mild epigastric discomfort, but no rebound,  rigidity or guarding.  No mass or organomegaly is present.  EXTREMITIES:  Without edema.  MUSCULOSKELETAL EXAM:  Unremarkable, no areas for concern in the back  were noted.  The patient was actually walked and he was able to ambulate  with no difficulties.  NEUROLOGIC:  No neurological deficits are present.   LABORATORY DATA:  CBC revealed mild anemia of 11.4 with MCV of 77,  platelet count 262.  His CMET actually revealed BUN was 27, which is  mildly elevated; creatinine actually  is 1.1.  Amylase and lipase were  normal.   IMAGING:  No imaging studies were done.   ASSESSMENT AND PLAN:  This is essentially a 60 year old African-American  male with medical problems as stated above, who was actually sent to the  ED by Dr. Kristian Covey for blood work; however, inadvertently got admitted  to the floor.  I was asked to see the patient as there was no one else  to see him.  Based on his blood work, everything looks okay.  He does  not have any evidence of acute renal failure at this time.  He does have  mild anemia, but no evidence for any recent acute blood loss.  He has  some mild epigastric tenderness, but his amylase and lipase are normal.  I think it is probably related to gastritis.  Otherwise, the patient  actually appears to be quite stable and does not really need admission  at this point.   I will prescribe him Prilosec for possible acid reflux disease.   I have discussed this case with Dr. Kristian Covey and the patient's PMD, Dr.  Lodema Hong.  I have told them that the patient, at this time, does not  really have renal failure, hence does not need to stay in the hospital.  He may follow up with his PMD  and Dr. Kristian Covey on as-needed basis, or  if he has worsening of his pain in the abdomen.   The patient was subsequently discharged home.      Osvaldo Shipper, MD  Electronically Signed     GK/MEDQ  D:  05/20/2006  T:  05/20/2006  Job:  [id number]   cc:   Milus Mallick. Lodema Hong, M.D.  Fax: [phone]   Jorja Loa, M.D.  Fax: [phone]

## 2010-09-20 ENCOUNTER — Encounter: Payer: Self-pay | Admitting: Family Medicine

## 2010-09-26 ENCOUNTER — Encounter: Payer: Self-pay | Admitting: Family Medicine

## 2010-09-26 ENCOUNTER — Ambulatory Visit (INDEPENDENT_AMBULATORY_CARE_PROVIDER_SITE_OTHER): Payer: Medicare Other | Admitting: Family Medicine

## 2010-09-26 VITALS — BP 128/90 | HR 82 | Resp 16 | Ht 63.0 in | Wt 187.1 lb

## 2010-09-26 DIAGNOSIS — F29 Unspecified psychosis not due to a substance or known physiological condition: Secondary | ICD-10-CM

## 2010-09-26 DIAGNOSIS — Z23 Encounter for immunization: Secondary | ICD-10-CM

## 2010-09-26 DIAGNOSIS — E119 Type 2 diabetes mellitus without complications: Secondary | ICD-10-CM

## 2010-09-26 DIAGNOSIS — I10 Essential (primary) hypertension: Secondary | ICD-10-CM

## 2010-09-26 DIAGNOSIS — E785 Hyperlipidemia, unspecified: Secondary | ICD-10-CM

## 2010-09-26 NOTE — Patient Instructions (Signed)
F/u in 3.5 to 4 months.  Fasting labs asap.  No med changes   Pneumonia vaccine

## 2010-09-30 LAB — COMPLETE METABOLIC PANEL WITH GFR
ALT: 13 U/L (ref 0–53)
Albumin: 4.2 g/dL (ref 3.5–5.2)
CO2: 28 mEq/L (ref 19–32)
Calcium: 9.3 mg/dL (ref 8.4–10.5)
Chloride: 104 mEq/L (ref 96–112)
GFR, Est African American: >60 mL/min (ref >60)
GFR, Est Non African American: >60 mL/min (ref >60)
Glucose, Bld: 94 mg/dL (ref 70–99)
Potassium: 3.8 mEq/L (ref 3.5–5.3)
Sodium: 139 mEq/L (ref 135–145)
Total Protein: 7.2 g/dL (ref 6.0–8.3)

## 2010-09-30 LAB — HEMOGLOBIN A1C: Hgb A1c MFr Bld: 6.3 % — ABNORMAL HIGH (ref <5.7)

## 2010-10-06 ENCOUNTER — Encounter: Payer: Self-pay | Admitting: Family Medicine

## 2010-10-06 NOTE — Assessment & Plan Note (Signed)
Controled with diet only at this time

## 2010-10-06 NOTE — Progress Notes (Signed)
  Subjective:    Patient ID: Xavier Tanner, male    DOB: Aug 28, 1950, 60 y.o.   MRN: [id number]  HPI The PT is here for follow up and re-evaluation of chronic medical conditions, medication management and review of recent lab and radiology data.  Preventive health is updated, specifically  Cancer screening,  and Immunization.   There are no new concerns.  There are no specific complaints  Pt is mentally challenged and lives in a group home. The staff member who accompanies him has no specific concerns. Pt is unable to give a reliable history      Review of Systems Denies recent fever or chills. Denies sinus pressure, nasal congestion, ear pain or sore throat. Denies chest congestion, productive cough or wheezing. Denies chest pains, palpitations, paroxysmal nocturnal dyspnea, orthopnea and leg swelling Denies abdominal pain, nausea, vomiting,diarrhea or constipation.   Denies dysuria, frequency, hesitancy or incontinence.Treated by urology for prostate cancer. Denies joint pain, swelling and limitation in mobility. Denies headaches, Denies skin break down or rash.        Objective:   Physical Exam Patient alert and in no Cardiopulmonary distress.  HEENT: No facial asymmetry, EOMI, no sinus tenderness, TM's clear, Oropharynx pink and moist.  Neck supple no adenopathy.  Chest: Clear to auscultation bilaterally.  CVS: S1, S2 no murmurs, no S3.  ABD: Soft non tender. Bowel sounds normal.  Ext: No edema  MS: Adequate ROM spine, shoulders, hips and knees.  Skin: Intact, no ulcerations or rash noted.  Psych: Good eye contact, blunted  affect. Memory lossnot anxious or depressed appearing.  CNS: CN 2-12 intact, power,  normal throughout.        Assessment & Plan:

## 2010-10-06 NOTE — Assessment & Plan Note (Signed)
Stable on meds, followed by psych

## 2010-10-06 NOTE — Assessment & Plan Note (Signed)
Updated labs needed, last check showed lack of control. Low fat diet discussed and encouraged

## 2010-10-06 NOTE — Assessment & Plan Note (Addendum)
Uncontrolled. Medication compliance addressed. Commitment to regular exercise, and healthy  eating habits with portion control discussed. DASH diet, and low fat diet discussed, and literature offered. No changes in medication at this time. Lifestyle changes encouraged, pt has [persistently elevated diastolic pressure, will adjust med at next visit if persists

## 2010-10-07 ENCOUNTER — Encounter: Payer: Self-pay | Admitting: Family Medicine

## 2010-10-08 ENCOUNTER — Ambulatory Visit: Payer: Medicare Other | Admitting: Family Medicine

## 2010-10-24 ENCOUNTER — Encounter: Payer: Self-pay | Admitting: Family Medicine

## 2010-10-31 ENCOUNTER — Encounter: Payer: Self-pay | Admitting: Family Medicine

## 2010-10-31 ENCOUNTER — Ambulatory Visit (INDEPENDENT_AMBULATORY_CARE_PROVIDER_SITE_OTHER): Payer: Medicare Other | Admitting: Family Medicine

## 2010-10-31 VITALS — BP 140/80 | HR 77 | Resp 16 | Ht 62.75 in | Wt 177.0 lb

## 2010-10-31 DIAGNOSIS — I10 Essential (primary) hypertension: Secondary | ICD-10-CM

## 2010-10-31 DIAGNOSIS — E669 Obesity, unspecified: Secondary | ICD-10-CM

## 2010-10-31 DIAGNOSIS — E119 Type 2 diabetes mellitus without complications: Secondary | ICD-10-CM

## 2010-10-31 DIAGNOSIS — F29 Unspecified psychosis not due to a substance or known physiological condition: Secondary | ICD-10-CM

## 2010-10-31 DIAGNOSIS — B356 Tinea cruris: Secondary | ICD-10-CM

## 2010-10-31 MED ORDER — CLOTRIMAZOLE-BETAMETHASONE 1-0.05 % EX CREA: TOPICAL_CREAM | Freq: Two times a day (BID) | CUTANEOUS | Status: DC | PRN

## 2010-10-31 NOTE — Progress Notes (Signed)
  Subjective:    Patient ID: Xavier Tanner, male    DOB: 1951/04/10, 60 y.o.   MRN: [id number]  HPI Hospitalized for approx 2 days at [facility] for dehydration. Only med changes are increase in gabapentin and reduced dose of citalopram. Reportedly facility caregiver states they are ensuring he drinks sufficient water and also reports activity level is normalizing and he is overall improved, though still somewhat drowsy and not "back to himself" Pt is an unreliable historian due to mild to moderate mental retardation as well as psychiatric illness   Review of Systems Denies recent fever or chills. Denies sinus pressure, nasal congestion, ear pain or sore throat. Denies chest congestion, productive cough or wheezing. Denies chest pains, palpitations,  and leg swelling Denies abdominal pain, nausea, vomiting,diarrhea or constipation.  Denies dysuria. Denies joint pain, swelling and limitation in mobility. Denies headaches, seizure, numbness, or tingling. Denies uncontrolled  depression, anxiety or insomnia. Denies skin break down or rash.      Objective:   Physical Exam Patient alert  and in no Cardiopulmonary distress.  HEENT: No facial asymmetry, EOMI, no sinus tenderness, TM's clear, Oropharynx pink and moist.  Neck supple no adenopathy.  Chest: Clear to auscultation bilaterally.  CVS: S1, S2 no murmurs, no S3.  ABD: Soft non tender. Bowel sounds normal.  Ext: No edema  MS: Adequate ROM spine, shoulders, hips and knees.  Skin: Intact, no ulcerations or rash noted.  Psych: Good eye contact, flat affect. Not anxious or depressed appearing.  CNS: CN 2-12 intact, power, tone and sensation normal throughout.        Assessment & Plan:

## 2010-10-31 NOTE — Patient Instructions (Addendum)
F/U September 20 or after.  I will check on labs done yesterday.  pls ensure  You drink at least 64 ounces water daily.  Fasting cmp and EgfR , HBA1C 3 to 5 daysbefore next visit  Continue meds as before.Cream sent in for fungal infection in the groin  I hope you feel better soon

## 2010-11-07 NOTE — Assessment & Plan Note (Signed)
suboptimal control, no med change at this time , due to recent dehydration status

## 2010-11-07 NOTE — Assessment & Plan Note (Signed)
Management per psychiatry 

## 2010-11-07 NOTE — Assessment & Plan Note (Signed)
Significant weight loss over past 4 months, will follow

## 2010-11-07 NOTE — Assessment & Plan Note (Signed)
rept hBA1C in Sept , currently diet controlled only

## 2011-01-01 LAB — CLOTEST (H. PYLORI), BIOPSY: Helicobacter screen: NEGATIVE

## 2011-01-06 ENCOUNTER — Encounter: Payer: Self-pay | Admitting: Family Medicine

## 2011-01-06 ENCOUNTER — Ambulatory Visit (INDEPENDENT_AMBULATORY_CARE_PROVIDER_SITE_OTHER): Payer: Medicare Other | Admitting: Family Medicine

## 2011-01-06 VITALS — BP 120/80 | HR 60 | Resp 16 | Ht 62.75 in | Wt 179.1 lb

## 2011-01-06 DIAGNOSIS — E785 Hyperlipidemia, unspecified: Secondary | ICD-10-CM

## 2011-01-06 DIAGNOSIS — Z23 Encounter for immunization: Secondary | ICD-10-CM

## 2011-01-06 DIAGNOSIS — I10 Essential (primary) hypertension: Secondary | ICD-10-CM

## 2011-01-06 DIAGNOSIS — E119 Type 2 diabetes mellitus without complications: Secondary | ICD-10-CM

## 2011-01-06 DIAGNOSIS — Z1211 Encounter for screening for malignant neoplasm of colon: Secondary | ICD-10-CM

## 2011-01-06 LAB — BASIC METABOLIC PANEL
BUN: 12
CO2: 27
Calcium: 8.7
Chloride: 103
Creatinine, Ser: 0.87
GFR calc Af Amer: >60
GFR calc non Af Amer: >60
Glucose, Bld: 94
Potassium: 3.8
Sodium: 134 — ABNORMAL LOW

## 2011-01-06 LAB — DIFFERENTIAL
Basophils Absolute: 0
Basophils Relative: 0
Eosinophils Absolute: 0
Eosinophils Relative: 0
Lymphocytes Relative: 23
Lymphs Abs: 0.6 — ABNORMAL LOW
Lymphs Abs: 1.2
Monocytes Absolute: 0.4
Monocytes Relative: 5
Neutro Abs: 3.3

## 2011-01-06 LAB — CBC
HCT: 34.4 — ABNORMAL LOW
Hemoglobin: 11.7 — ABNORMAL LOW
Hemoglobin: 11.8 — ABNORMAL LOW
MCHC: 34.4
MCV: 76.9 — ABNORMAL LOW
Platelets: 228
RBC: 4.47
RDW: 15.2
WBC: 5.1
WBC: 6.8

## 2011-01-06 LAB — HEMOCCULT GUIAC POC 1CARD (OFFICE)

## 2011-01-06 LAB — URINALYSIS, ROUTINE W REFLEX MICROSCOPIC
Bilirubin Urine: NEGATIVE
Ketones, ur: NEGATIVE
Nitrite: NEGATIVE
Specific Gravity, Urine: 1.01
pH: 5.5

## 2011-01-06 LAB — URINE MICROSCOPIC-ADD ON

## 2011-01-06 NOTE — Patient Instructions (Signed)
Overall things look good! Continue current medications He needs fasting blood work done- CMET, A1C, Lipid panel and CBC- please have this done within the next 2 weeks F/U in 3 months with Dr. Lodema Hong We will call with any abnormal lab results and  Fax and copy [phone]- Lonell Grandchild

## 2011-01-06 NOTE — Assessment & Plan Note (Signed)
Blood pressure looks good today. His goal is less than 130/80 as tolerated. No change in medication

## 2011-01-06 NOTE — Assessment & Plan Note (Signed)
Diet-controlled, obtain A1c

## 2011-01-06 NOTE — Progress Notes (Signed)
Lot # [id number] 2L 10/12

## 2011-01-06 NOTE — Assessment & Plan Note (Signed)
Check lipid panel to titrate statin as needed

## 2011-01-06 NOTE — Progress Notes (Signed)
  Subjective:    Patient ID: Xavier Tanner, male    DOB: 1951-01-30, 60 y.o.   MRN: [id number]  HPI Facility has no concerns, unable to obtain history from patient  DM- Fasting CBG 70-120 ( info through 8/31), diet controlled, CBG taken fasting   Eye doctor appt 01/15/11, sees podiatry  HTN- tolerating medications ER- pt appears to have been to [facility] on 9/1 given Zpak and cough medication for bronchitis, now resolved  Seen by urology for h/O Prostate Cancer- no changes Seen by Psych- celexa dose recently changed  Needs Flu shot today Review of Systems   GEN- denies  fever, +weight loss (has been walking),weakness, +recent illness CVS- denies chest pain, palpitations RESP- denies SOB, cough, wheeze ABD- denies N/V, change in stools, abd pain Neuro- denies headache, dizziness, syncope, seizure activity      Objective:   Physical Exam GEN- NAD, alert and oriented x3 HEENT- PERRL, EOMI, non injected sclera, pink conjunctiva, MMM, oropharynx clear CVS- RRR, no murmur RESP-CTAB EXT- No edema,mildcallus formation, No open lesions, thick nails Psych- quit answers only yes/no, not overly depressed appearing, mental retardation noted  Pulses- Radial, DP- 2+ FOBT- neg, soft brown stool       Assessment & Plan:

## 2011-01-07 ENCOUNTER — Telehealth: Payer: Self-pay | Admitting: Family Medicine

## 2011-01-07 LAB — CBC
HCT: 31.1 — ABNORMAL LOW
HCT: 32 — ABNORMAL LOW
Hemoglobin: 10.3 — ABNORMAL LOW
Hemoglobin: 10.7 — ABNORMAL LOW
Hemoglobin: 10.9 — ABNORMAL LOW
Hemoglobin: 11.1 — ABNORMAL LOW
MCHC: 34.1
MCV: 77.2 — ABNORMAL LOW
MCV: 80.2
Platelets: 219
Platelets: 278
RBC: 2.85 — ABNORMAL LOW
RBC: 3.24 — ABNORMAL LOW
RBC: 3.88 — ABNORMAL LOW
RDW: 16 — ABNORMAL HIGH
RDW: 16.5 — ABNORMAL HIGH
WBC: 4.4
WBC: 4.5
WBC: 5.4
WBC: 9.3

## 2011-01-07 LAB — URINALYSIS, ROUTINE W REFLEX MICROSCOPIC
Ketones, ur: 15 — AB
Ketones, ur: NEGATIVE
Leukocytes, UA: NEGATIVE
Nitrite: NEGATIVE
Nitrite: POSITIVE — AB
Specific Gravity, Urine: 1.015
Specific Gravity, Urine: >1.03 — ABNORMAL HIGH
Urobilinogen, UA: 0.2
pH: 7

## 2011-01-07 LAB — CROSSMATCH

## 2011-01-07 LAB — DIFFERENTIAL
Basophils Absolute: 0
Basophils Relative: 0
Basophils Relative: 0
Eosinophils Absolute: 0
Eosinophils Relative: 1
Eosinophils Relative: 1
Lymphocytes Relative: 17
Lymphocytes Relative: 20
Lymphocytes Relative: 29
Lymphs Abs: 0.7
Lymphs Abs: 1.1
Lymphs Abs: 1.2
Lymphs Abs: 1.3
Lymphs Abs: 1.8
Monocytes Absolute: 0.4
Monocytes Absolute: 0.4
Monocytes Absolute: 0.5
Monocytes Relative: 5
Monocytes Relative: 6
Monocytes Relative: 7
Monocytes Relative: 7
Neutro Abs: 2.8
Neutro Abs: 3.1
Neutro Abs: 4.5
Neutro Abs: 5.9
Neutro Abs: 8.1 — ABNORMAL HIGH
Neutrophils Relative %: 64
Neutrophils Relative %: 76
Neutrophils Relative %: 87 — ABNORMAL HIGH

## 2011-01-07 LAB — URINE CULTURE
Colony Count: NO GROWTH
Colony Count: NO GROWTH
Culture: NO GROWTH

## 2011-01-07 LAB — TYPE AND SCREEN: ABO/RH(D): O POS

## 2011-01-07 LAB — PROTIME-INR
INR: 1
INR: 1.1

## 2011-01-07 LAB — BASIC METABOLIC PANEL
Calcium: 8.7
Chloride: 105
GFR calc Af Amer: >60
GFR calc Af Amer: >60
GFR calc Af Amer: >60
GFR calc non Af Amer: >60
GFR calc non Af Amer: >60
Potassium: 3.2 — ABNORMAL LOW
Potassium: 3.6
Potassium: 3.7
Sodium: 135
Sodium: 139
Sodium: 140

## 2011-01-07 LAB — HEMOGLOBIN A1C: Hgb A1c MFr Bld: 5.1

## 2011-01-07 LAB — APTT
aPTT: 41 — ABNORMAL HIGH
aPTT: 41 — ABNORMAL HIGH

## 2011-01-07 LAB — ABO/RH: ABO/RH(D): O POS

## 2011-01-07 LAB — URINE MICROSCOPIC-ADD ON

## 2011-01-07 NOTE — Telephone Encounter (Signed)
If the visit was an Scientist, physiological, sick vist, yes

## 2011-01-08 LAB — CBC
HCT: 29.6 — ABNORMAL LOW
HCT: 38 — ABNORMAL LOW
Hemoglobin: 13.3
MCV: 83
Platelets: 200
RBC: 4.57
WBC: 5.9
WBC: 6.7

## 2011-01-08 LAB — BASIC METABOLIC PANEL
BUN: 10
BUN: 10
Chloride: 112
GFR calc non Af Amer: >60
Potassium: 3.1 — ABNORMAL LOW
Potassium: 3.8
Sodium: 137

## 2011-01-08 LAB — DIFFERENTIAL
Eosinophils Absolute: 0
Eosinophils Relative: 0
Lymphs Abs: 1.2
Monocytes Absolute: 0.4
Monocytes Relative: 6

## 2011-01-24 LAB — LIPID PANEL
LDL Cholesterol: 80 mg/dL (ref 0–99)
Triglycerides: 63 mg/dL (ref <150)
VLDL: 13 mg/dL (ref 0–40)

## 2011-01-24 LAB — HEMOGLOBIN A1C: Mean Plasma Glucose: 131 mg/dL — ABNORMAL HIGH (ref <117)

## 2011-01-24 LAB — COMPREHENSIVE METABOLIC PANEL
ALT: 15 U/L (ref 0–53)
AST: 27 U/L (ref 0–37)
CO2: 26 mEq/L (ref 19–32)
Calcium: 9.2 mg/dL (ref 8.4–10.5)
Chloride: 104 mEq/L (ref 96–112)
Creat: 1.01 mg/dL (ref 0.50–1.35)
Potassium: 3.8 mEq/L (ref 3.5–5.3)
Sodium: 142 mEq/L (ref 135–145)
Total Protein: 7 g/dL (ref 6.0–8.3)

## 2011-01-24 LAB — CBC
HCT: 40.5 % (ref 39.0–52.0)
MCHC: 33.1 g/dL (ref 30.0–36.0)
MCV: 80.2 fL (ref 78.0–100.0)
Platelets: 157 10*3/uL (ref 150–400)
RDW: 17.7 % — ABNORMAL HIGH (ref 11.5–15.5)

## 2011-01-28 ENCOUNTER — Ambulatory Visit: Payer: Medicare Other | Admitting: Family Medicine

## 2011-01-28 NOTE — Progress Notes (Signed)
Message left for patient to call back

## 2011-01-28 NOTE — Progress Notes (Signed)
Labs faxed to [facility] as requested

## 2011-01-30 LAB — COMPREHENSIVE METABOLIC PANEL
AST: 17
Albumin: 3.9
Calcium: 9.4
Creatinine, Ser: 1.07
GFR calc Af Amer: >60
GFR calc non Af Amer: >60

## 2011-01-30 LAB — POCT CARDIAC MARKERS
CKMB, poc: <1 — ABNORMAL LOW
Myoglobin, poc: 57.9
Troponin i, poc: <0.05

## 2011-01-30 LAB — CBC
MCHC: 33.7
MCV: 76.1 — ABNORMAL LOW
Platelets: 284

## 2011-01-30 LAB — DIFFERENTIAL
Eosinophils Relative: 1
Lymphocytes Relative: 20
Lymphs Abs: 1.5
Monocytes Absolute: 0.4
Neutro Abs: 5.9

## 2011-01-30 LAB — LIPASE, BLOOD: Lipase: 37

## 2011-02-03 ENCOUNTER — Telehealth: Payer: Self-pay | Admitting: *Deleted

## 2011-02-03 NOTE — Telephone Encounter (Signed)
Message copied by Diamantina Monks on Mon Feb 03, 2011  9:36 AM ------      Message from: Milinda Antis F      Created: Fri Jan 24, 2011  8:26 AM       Please call [facility] and get them to add on the A1C, it was ordered, this has occurred many times and usually the A1C at the bottom of the labs shows no result.

## 2011-02-03 NOTE — Telephone Encounter (Signed)
Fax sent.

## 2011-02-04 ENCOUNTER — Telehealth: Payer: Self-pay | Admitting: Family Medicine

## 2011-02-05 MED ORDER — DIVALPROEX SODIUM ER 500 MG PO TB24: 500.0000 mg | ORAL_TABLET | Freq: Every day | ORAL | Status: DC

## 2011-02-05 MED ORDER — CITALOPRAM HYDROBROMIDE 10 MG PO TABS: 10.0000 mg | ORAL_TABLET | Freq: Every day | ORAL | Status: DC

## 2011-02-05 NOTE — Telephone Encounter (Signed)
Addended by: Abner Greenspan on: 02/05/2011 01:31 PM   Modules accepted: Orders, Medications

## 2011-02-05 NOTE — Telephone Encounter (Signed)
Form completed.

## 2011-03-10 ENCOUNTER — Other Ambulatory Visit (HOSPITAL_COMMUNITY): Payer: Self-pay | Admitting: Urology

## 2011-03-10 DIAGNOSIS — R310 Gross hematuria: Secondary | ICD-10-CM

## 2011-03-13 ENCOUNTER — Ambulatory Visit (HOSPITAL_COMMUNITY): Payer: Medicare Other

## 2011-03-14 ENCOUNTER — Ambulatory Visit (HOSPITAL_COMMUNITY)
Admission: RE | Admit: 2011-03-14 | Discharge: 2011-03-14 | Disposition: A | Discharge status: Home / Self Care | Payer: Medicare Other | Source: Ambulatory Visit | Attending: Urology | Admitting: Urology

## 2011-03-14 DIAGNOSIS — R9389 Abnormal findings on diagnostic imaging of other specified body structures: Secondary | ICD-10-CM | POA: Insufficient documentation

## 2011-03-14 DIAGNOSIS — R310 Gross hematuria: Secondary | ICD-10-CM | POA: Insufficient documentation

## 2011-03-20 ENCOUNTER — Encounter: Payer: Self-pay | Admitting: Family Medicine

## 2011-03-25 ENCOUNTER — Ambulatory Visit: Payer: Medicare Other | Admitting: Family Medicine

## 2011-03-25 ENCOUNTER — Encounter: Payer: Self-pay | Admitting: Family Medicine

## 2011-04-16 ENCOUNTER — Encounter: Payer: Self-pay | Admitting: Family Medicine

## 2011-04-17 ENCOUNTER — Ambulatory Visit (INDEPENDENT_AMBULATORY_CARE_PROVIDER_SITE_OTHER): Payer: Medicare Other | Admitting: Family Medicine

## 2011-04-17 ENCOUNTER — Encounter: Payer: Self-pay | Admitting: Family Medicine

## 2011-04-17 VITALS — BP 100/60 | HR 79 | Temp 98.5°F | Resp 16 | Ht 62.75 in | Wt 173.0 lb

## 2011-04-17 DIAGNOSIS — I10 Essential (primary) hypertension: Secondary | ICD-10-CM

## 2011-04-17 DIAGNOSIS — F29 Unspecified psychosis not due to a substance or known physiological condition: Secondary | ICD-10-CM

## 2011-04-17 DIAGNOSIS — R5381 Other malaise: Secondary | ICD-10-CM

## 2011-04-17 DIAGNOSIS — E559 Vitamin D deficiency, unspecified: Secondary | ICD-10-CM

## 2011-04-17 DIAGNOSIS — E785 Hyperlipidemia, unspecified: Secondary | ICD-10-CM

## 2011-04-17 DIAGNOSIS — J40 Bronchitis, not specified as acute or chronic: Secondary | ICD-10-CM

## 2011-04-17 DIAGNOSIS — E119 Type 2 diabetes mellitus without complications: Secondary | ICD-10-CM

## 2011-04-17 DIAGNOSIS — R5383 Other fatigue: Secondary | ICD-10-CM

## 2011-04-17 MED ORDER — SULFAMETHOXAZOLE-TRIMETHOPRIM 800-160 MG PO TABS: 1.0000 | ORAL_TABLET | Freq: Two times a day (BID) | ORAL | Status: AC

## 2011-04-17 MED ORDER — BENZONATATE 100 MG PO CAPS: 100.0000 mg | ORAL_CAPSULE | Freq: Two times a day (BID) | ORAL | Status: AC

## 2011-04-17 NOTE — Progress Notes (Signed)
Subjective:     Patient ID: Xavier Tanner, male   DOB: 1950-04-27, 61 y.o.   MRN: [id number]  HPI 1 week h/o head and chest congestion which has been improving. Cough persists, no fever or chills. Has had recurrent hematuria since Mar 10, 2011, being followed by urology, has been on  1 course of antibiotic, none in thepast 3 weeks. Prior to this he has been doing well. Mental health is stable and controlled on reime, noted to be excessively sleepy at times. Blood sugars are checked daily, fasting seldom over 110 off medication  Review of Systems See HPI Pt is poor historian due mental challenges, has caregiver accompanying him who provides most  Denies chest pains, palpitations and leg swelling Denies abdominal pain, nausea, vomiting,diarrhea or constipation.   Denies dysuria, frequency, hesitancy or incontinence. Denies joint pain, swelling and limitation in mobility. Denies headaches, seizures, numbness, or tingling. Denies skin break down or rash.        Objective:   Physical Exam Patient alert and oriented and in no cardiopulmonary distress.  HEENT: No facial asymmetry, EOMI, no  sinus tenderness,  oropharynx pink and moist.  Neck supple no adenopathy.  Chest: decrased air entry scattered crackles.  CVS: S1, S2 no murmurs, no S3.  ABD: Soft non tender. Bowel sounds normal.  Ext: No edema  MS: Adequate ROM spine, shoulders, hips and knees.  Skin: Intact, no ulcerations or rash noted.  Psych:. Memory loss not anxious or depressed appearing.Flat affect  CNS: CN 2-12 intact, power, tone and sensation normal throughout.     Assessment:        Plan:

## 2011-04-17 NOTE — Patient Instructions (Signed)
F/u in 4.5 month  You are being treated for bronchitis, med is sent in take for 10 days.   hbA1C , chem 7 and tSH today

## 2011-04-18 LAB — COMPREHENSIVE METABOLIC PANEL
AST: 19 U/L (ref 0–37)
Alkaline Phosphatase: 40 U/L (ref 39–117)
BUN: 21 mg/dL (ref 6–23)
Creat: 1.04 mg/dL (ref 0.50–1.35)
Total Bilirubin: 0.3 mg/dL (ref 0.3–1.2)

## 2011-04-20 NOTE — Assessment & Plan Note (Signed)
Controlled, no change in medication  

## 2011-04-20 NOTE — Assessment & Plan Note (Signed)
Acute onset, 10 day course of antibiotics

## 2011-04-20 NOTE — Assessment & Plan Note (Signed)
Diet controlled.  

## 2011-04-20 NOTE — Assessment & Plan Note (Signed)
Well controlled , treated by psych in [facility]

## 2011-04-20 NOTE — Assessment & Plan Note (Signed)
Will check level to see if extra supplement still needed

## 2011-08-25 ENCOUNTER — Telehealth: Payer: Self-pay | Admitting: Family Medicine

## 2011-08-25 NOTE — Telephone Encounter (Signed)
pls let Xavier Tanner's as well as the home know pt does not need nor does he qualify for diabetic testing supplies , he is no longer on med for diabetes at this time

## 2011-09-03 NOTE — Telephone Encounter (Signed)
Sent note to pharmacy notifying them that pt does not qualify for supplies.  Attempted to call facility but there was no answer.  Will make another attempt.

## 2011-09-09 ENCOUNTER — Encounter: Payer: Self-pay | Admitting: Family Medicine

## 2011-09-09 ENCOUNTER — Ambulatory Visit (INDEPENDENT_AMBULATORY_CARE_PROVIDER_SITE_OTHER): Payer: Medicare Other | Admitting: Family Medicine

## 2011-09-09 VITALS — BP 130/82 | HR 72 | Resp 18 | Ht 62.75 in | Wt 176.1 lb

## 2011-09-09 DIAGNOSIS — R7301 Impaired fasting glucose: Secondary | ICD-10-CM

## 2011-09-09 DIAGNOSIS — R7309 Other abnormal glucose: Secondary | ICD-10-CM

## 2011-09-09 DIAGNOSIS — R7302 Impaired glucose tolerance (oral): Secondary | ICD-10-CM

## 2011-09-09 DIAGNOSIS — I10 Essential (primary) hypertension: Secondary | ICD-10-CM

## 2011-09-09 DIAGNOSIS — E785 Hyperlipidemia, unspecified: Secondary | ICD-10-CM

## 2011-09-09 DIAGNOSIS — F29 Unspecified psychosis not due to a substance or known physiological condition: Secondary | ICD-10-CM

## 2011-09-09 NOTE — Progress Notes (Signed)
  Subjective:    Patient ID: Xavier Tanner, male    DOB: 1950-08-26, 61 y.o.   MRN: [id number]  HPI The PT is here for follow up and re-evaluation of chronic medical conditions, medication management and review of any available recent lab and radiology data.  Preventive health is updated, specifically  Cancer screening and Immunization. Follows with urology for prostate disease and is uTD with appointments  Questions or concerns regarding consultations or procedures which the PT has had in the interim are  addressed. The PT denies any adverse reactions to current medications since the last visit.  There are no new concerns.  There are no specific complaints       Review of Systems See HPI, history primarily from caregiver, pt is mentally challenged Denies recent fever or chills. Denies sinus pressure, nasal congestion, ear pain or sore throat. Denies chest congestion, productive cough or wheezing. Denies chest pains, palpitations and leg swelling Denies abdominal pain, nausea, vomiting,diarrhea or constipation.   Denies dysuria, frequency, hesitancy or incontinence. Denies joint pain, swelling and limitation in mobility. Denies headaches, seizures, numbness, or tingling. Denies uncontrolled  depression, anxiety or insomnia. Denies skin break down or rash.       Objective:   Physical Exam Patient alert and oriented and in no cardiopulmonary distress.  HEENT: No facial asymmetry, EOMI, no sinus tenderness,  oropharynx pink and moist.  Neck supple no adenopathy.  Chest: Clear to auscultation bilaterally.  CVS: S1, S2 no murmurs, no S3.  ABD: Soft non tender. Bowel sounds normal.  Ext: No edema  MS: Adequate ROM spine, shoulders, hips and knees.  Skin: Intact, no ulcerations or rash noted.  Psych: Good eye contact,blunted  affect. Memory loss not anxious or depressed appearing.  CNS: CN 2-12 intact, power, tone and sensation normal throughout.        Assessment  & Plan:

## 2011-09-09 NOTE — Telephone Encounter (Signed)
Pt in for visit 5/28

## 2011-09-09 NOTE — Patient Instructions (Signed)
F/u in 3.5 to 4 month  HBA1C and chem 7 today. Blood pressure is excellent.  Continue with urology as before  Fasting lipid, cmp and hBa1C one week  Before next visit.   You are no longer diabetic, but still need to follow a diet low in carbohydrate and sugar, and keep active.You are at risk of becoming diabetic again  No testing supplies are prescribed for you.  You need to keep getting blood tests on schedule and keeping appointments

## 2011-09-10 LAB — BASIC METABOLIC PANEL
Chloride: 101 mEq/L (ref 96–112)
Creat: 1.01 mg/dL (ref 0.50–1.35)
Potassium: 3.9 mEq/L (ref 3.5–5.3)

## 2011-09-10 LAB — HEMOGLOBIN A1C
Hgb A1c MFr Bld: 5.9 % — ABNORMAL HIGH (ref <5.7)
Mean Plasma Glucose: 123 mg/dL — ABNORMAL HIGH (ref <117)

## 2011-09-14 NOTE — Assessment & Plan Note (Signed)
Hyperlipidemia:Low fat diet discussed and encouraged.  Updated labs needed before next visit

## 2011-09-14 NOTE — Assessment & Plan Note (Signed)
Improved, has h/o diabetes, doing extremely well off medication with dietary change only at this time

## 2011-09-14 NOTE — Assessment & Plan Note (Signed)
Stable, followed by psych 

## 2011-09-14 NOTE — Assessment & Plan Note (Signed)
Controlled, no change in medication  

## 2011-10-03 ENCOUNTER — Other Ambulatory Visit: Payer: Self-pay

## 2011-10-03 MED ORDER — CITALOPRAM HYDROBROMIDE 20 MG PO TABS: 20.0000 mg | ORAL_TABLET | Freq: Every day | ORAL | Status: DC

## 2011-11-04 ENCOUNTER — Other Ambulatory Visit: Payer: Self-pay

## 2011-11-04 MED ORDER — PRAVASTATIN SODIUM 40 MG PO TABS: 40.0000 mg | ORAL_TABLET | Freq: Every day | ORAL | Status: DC

## 2011-11-19 ENCOUNTER — Encounter: Payer: Self-pay | Admitting: Family Medicine

## 2011-11-19 ENCOUNTER — Ambulatory Visit (INDEPENDENT_AMBULATORY_CARE_PROVIDER_SITE_OTHER): Payer: Medicare Other | Admitting: Family Medicine

## 2011-11-19 VITALS — BP 136/84 | HR 75 | Resp 18 | Ht 62.75 in | Wt 178.1 lb

## 2011-11-19 DIAGNOSIS — R7302 Impaired glucose tolerance (oral): Secondary | ICD-10-CM

## 2011-11-19 DIAGNOSIS — F29 Unspecified psychosis not due to a substance or known physiological condition: Secondary | ICD-10-CM

## 2011-11-19 DIAGNOSIS — Z79899 Other long term (current) drug therapy: Secondary | ICD-10-CM

## 2011-11-19 DIAGNOSIS — E559 Vitamin D deficiency, unspecified: Secondary | ICD-10-CM

## 2011-11-19 DIAGNOSIS — R109 Unspecified abdominal pain: Secondary | ICD-10-CM

## 2011-11-19 DIAGNOSIS — R7309 Other abnormal glucose: Secondary | ICD-10-CM

## 2011-11-19 DIAGNOSIS — Z1211 Encounter for screening for malignant neoplasm of colon: Secondary | ICD-10-CM

## 2011-11-19 DIAGNOSIS — E785 Hyperlipidemia, unspecified: Secondary | ICD-10-CM

## 2011-11-19 DIAGNOSIS — Z01818 Encounter for other preprocedural examination: Secondary | ICD-10-CM

## 2011-11-19 DIAGNOSIS — R1900 Intra-abdominal and pelvic swelling, mass and lump, unspecified site: Secondary | ICD-10-CM

## 2011-11-19 DIAGNOSIS — I10 Essential (primary) hypertension: Secondary | ICD-10-CM

## 2011-11-19 NOTE — Patient Instructions (Addendum)
Annual wellness end September or early October. Chem 7, pt/ptt, cbc today, HBA1C Cancel September appt    Fasting labs for October visit.Lipid, tsh and vit D   Xavier Tanner is doing well, he is referred for scan of abdomen and pelvis  We will call when med clearance form is collected  Rectal today

## 2011-11-20 ENCOUNTER — Telehealth: Payer: Self-pay | Admitting: Family Medicine

## 2011-11-20 LAB — BASIC METABOLIC PANEL
BUN: 18 mg/dL (ref 6–23)
CO2: 31 mEq/L (ref 19–32)
Calcium: 9.4 mg/dL (ref 8.4–10.5)
Creat: 1.04 mg/dL (ref 0.50–1.35)
Glucose, Bld: 84 mg/dL (ref 70–99)

## 2011-11-20 LAB — CBC
MCH: 27.7 pg (ref 26.0–34.0)
MCHC: 33.9 g/dL (ref 30.0–36.0)
MCV: 81.6 fL (ref 78.0–100.0)
Platelets: 157 10*3/uL (ref 150–400)
RBC: 4.77 MIL/uL (ref 4.22–5.81)
RDW: 15.5 % (ref 11.5–15.5)

## 2011-11-20 LAB — HEPATIC FUNCTION PANEL
Albumin: 4.2 g/dL (ref 3.5–5.2)
Alkaline Phosphatase: 40 U/L (ref 39–117)
Total Protein: 7.4 g/dL (ref 6.0–8.3)

## 2011-11-21 NOTE — Telephone Encounter (Signed)
Patient is aware 

## 2011-11-23 ENCOUNTER — Encounter: Payer: Self-pay | Admitting: Family Medicine

## 2011-11-23 NOTE — Assessment & Plan Note (Signed)
Stable and controlled, managed by psychiatry 

## 2011-11-23 NOTE — Assessment & Plan Note (Signed)
Appears to have RUQ mass, body habitus makes this difficult to determine, referred for scan

## 2011-11-23 NOTE — Progress Notes (Signed)
  Subjective:    Patient ID: Xavier Tanner, male    DOB: October 29, 1950, 61 y.o.   MRN: [id number]  HPI The PT is here for follow up and re-evaluation of chronic medical conditions, medication management and review of any available recent lab and radiology data.  Preventive health is updated, specifically  Cancer screening and Immunization.   Questions or concerns regarding consultations or procedures which the PT has had in the interim are  addressed. The PT denies any adverse reactions to current medications since the last visit.  Pt has upcoming dental work and needs to be assesed for this medically, he comes with a form    Review of Systems See HPI, unreliable historian, butfacility staff has no concerns Denies recent fever or chills. Denies sinus pressure, nasal congestion, ear pain or sore throat. Denies chest congestion, productive cough or wheezing. Denies chest pains, palpitations and leg swelling Denies abdominal pain, nausea, vomiting,diarrhea or constipation.   Denies dysuria, frequency, hesitancy or incontinence. Denies joint pain, swelling and limitation in mobility. Denies headaches, seizures, numbness, or tingling. Denies uncontrolled  depression, anxiety or insomnia. Denies skin break down or rash.        Objective:   Physical Exam  Patient alert and oriented and in no cardiopulmonary distress.  HEENT: No facial asymmetry, EOMI, no sinus tenderness,  oropharynx pink and moist.  Neck supple no adenopathy.  Chest: Clear to auscultation bilaterally.  CVS: S1, S2 no murmurs, no S3.  ABD: Soft non tender. Bowel sounds normal.Possible rUQ mass. Rectal Guaiac negative stool  Ext: No edema  MS: Adequate ROM spine, shoulders, hips and knees.  Skin: Intact, no ulcerations or rash noted.  Psych: Good eye contact, normal affect. Memory intact not anxious or depressed appearing.  CNS: CN 2-12 intact, power, tone and sensation normal throughout.         Assessment & Plan:

## 2011-11-23 NOTE — Assessment & Plan Note (Addendum)
Nearly Normal hBA1C with dietary management only, has been on medication in the past

## 2011-11-23 NOTE — Assessment & Plan Note (Signed)
Controlled, no change in medication  

## 2011-11-24 ENCOUNTER — Ambulatory Visit (HOSPITAL_COMMUNITY)
Admission: RE | Admit: 2011-11-24 | Discharge: 2011-11-24 | Disposition: A | Discharge status: Home / Self Care | Payer: Medicare Other | Source: Ambulatory Visit | Attending: Family Medicine | Admitting: Family Medicine

## 2011-11-24 DIAGNOSIS — R1011 Right upper quadrant pain: Secondary | ICD-10-CM | POA: Insufficient documentation

## 2011-11-24 DIAGNOSIS — R1901 Right upper quadrant abdominal swelling, mass and lump: Secondary | ICD-10-CM | POA: Insufficient documentation

## 2011-11-24 DIAGNOSIS — K449 Diaphragmatic hernia without obstruction or gangrene: Secondary | ICD-10-CM | POA: Insufficient documentation

## 2011-11-24 DIAGNOSIS — R32 Unspecified urinary incontinence: Secondary | ICD-10-CM | POA: Insufficient documentation

## 2011-11-24 DIAGNOSIS — R1900 Intra-abdominal and pelvic swelling, mass and lump, unspecified site: Secondary | ICD-10-CM

## 2011-11-24 MED ORDER — IOHEXOL 300 MG/ML  SOLN
100.0000 mL | Freq: Once | INTRAMUSCULAR | Status: AC | PRN
  Administered 2011-11-24: 100 mL via INTRAVENOUS

## 2011-11-25 ENCOUNTER — Telehealth: Payer: Self-pay | Admitting: Family Medicine

## 2011-11-26 NOTE — Telephone Encounter (Signed)
Small hiatal hernia, no mass, pls let them know, good report, nothing further to do

## 2011-12-03 NOTE — Telephone Encounter (Signed)
Group home aware

## 2011-12-22 ENCOUNTER — Encounter (HOSPITAL_COMMUNITY): Payer: Self-pay | Admitting: *Deleted

## 2011-12-22 ENCOUNTER — Emergency Department (HOSPITAL_COMMUNITY)
Admission: EM | Admit: 2011-12-22 | Discharge: 2011-12-22 | Disposition: A | Discharge status: Home / Self Care | Payer: Medicare Other | Attending: Emergency Medicine | Admitting: Emergency Medicine

## 2011-12-22 DIAGNOSIS — F329 Major depressive disorder, single episode, unspecified: Secondary | ICD-10-CM | POA: Insufficient documentation

## 2011-12-22 DIAGNOSIS — R319 Hematuria, unspecified: Secondary | ICD-10-CM | POA: Insufficient documentation

## 2011-12-22 DIAGNOSIS — F71 Moderate intellectual disabilities: Secondary | ICD-10-CM | POA: Insufficient documentation

## 2011-12-22 DIAGNOSIS — I10 Essential (primary) hypertension: Secondary | ICD-10-CM | POA: Insufficient documentation

## 2011-12-22 DIAGNOSIS — E119 Type 2 diabetes mellitus without complications: Secondary | ICD-10-CM | POA: Insufficient documentation

## 2011-12-22 DIAGNOSIS — Z79899 Other long term (current) drug therapy: Secondary | ICD-10-CM | POA: Insufficient documentation

## 2011-12-22 DIAGNOSIS — Z8546 Personal history of malignant neoplasm of prostate: Secondary | ICD-10-CM | POA: Insufficient documentation

## 2011-12-22 DIAGNOSIS — F3289 Other specified depressive episodes: Secondary | ICD-10-CM | POA: Insufficient documentation

## 2011-12-22 LAB — URINE MICROSCOPIC-ADD ON

## 2011-12-22 LAB — URINALYSIS, ROUTINE W REFLEX MICROSCOPIC
Bilirubin Urine: NEGATIVE
Protein, ur: NEGATIVE mg/dL
Urobilinogen, UA: 0.2 mg/dL (ref 0.0–1.0)

## 2011-12-22 NOTE — ED Notes (Signed)
Caregiver verbalizes understanding of dc instructions

## 2011-12-22 NOTE — ED Notes (Addendum)
Pt has started having bloody urine Unsure of when it started; Pt is from [facility] Denies any pain; Per caregiver she believes pt has been in pain

## 2011-12-22 NOTE — ED Notes (Signed)
Hematuria,  Pt is from a group home , has care giver with him.

## 2011-12-22 NOTE — ED Provider Notes (Signed)
History     CSN: [id number]  Arrival date & time 12/22/11  2008   First MD Initiated Contact with Patient 12/22/11 2104      Chief Complaint  Patient presents with  . Hematuria     Patient is a 61 y.o. male presenting with hematuria. The history is provided by the patient and a caregiver. The history is limited by a developmental delay.  Hematuria This is a new problem. The current episode started today. He describes the hematuria as gross hematuria. He is experiencing no pain. Pertinent negatives include no fever.  pt presents with hematuria He has h/o moderate MR.  The caregiver reports it was noted he has blood in his underwear.  No trauma is reported.  He is otherwise at his baseline.  Past Medical History  Diagnosis Date  . Urinary incontinence   . Psychotic disorder   . Diabetes mellitus, type 2   . Hypertension   . Bilateral cataracts 2011  . Vitamin d deficiency   . Mental retardation   . Depression   . Prostate cancer     Past Surgical History  Procedure Date  . Hemorroidectomy 2002  . Transurethral resection of prostate July 09, 2007    Family History  Problem Relation Age of Onset  . Mental illness Mother   . Hypertension Mother   . Diabetes Mother   . Stroke Father     History  Substance Use Topics  . Smoking status: Never Smoker   . Smokeless tobacco: Not on file  . Alcohol Use: No      Review of Systems  Unable to perform ROS: Other  Constitutional: Negative for fever.  Genitourinary: Positive for hematuria.  unable to perform due to mental retardation and pt currently nonverbal on my exam  Allergies  Ace inhibitors  Home Medications   Current Outpatient Rx  Name Route Sig Dispense Refill  . ASPIRIN 81 MG PO TABS Oral Take 81 mg by mouth every morning.     Marland Kitchen CITALOPRAM HYDROBROMIDE 20 MG PO TABS Oral Take 20 mg by mouth every morning.    Marland Kitchen DIVALPROEX SODIUM ER 500 MG PO TB24 Oral Take 1,000 mg by mouth 2 (two) times daily. For  behavior    . ERGOCALCIFEROL 50000 UNITS PO CAPS Oral Take 50,000 Units by mouth every 30 (thirty) days.      Marland Kitchen HYDROCHLOROTHIAZIDE 25 MG PO TABS Oral Take 25 mg by mouth daily.      Marland Kitchen MEMANTINE HCL 10 MG PO TABS Oral Take 10 mg by mouth 2 (two) times daily.    Marland Kitchen OMEPRAZOLE 20 MG PO CPDR Oral Take 20 mg by mouth daily.      Marland Kitchen POTASSIUM CHLORIDE CRYS ER 20 MEQ PO TBCR Oral Take 20 mEq by mouth daily.      Marland Kitchen PRAVASTATIN SODIUM 40 MG PO TABS Oral Take 1 tablet (40 mg total) by mouth at bedtime. 30 tablet 4  . TAMSULOSIN HCL 0.4 MG PO CAPS Oral Take 0.4 mg by mouth daily.      Marland Kitchen ZIPRASIDONE HCL 40 MG PO CAPS Oral Take 40 mg by mouth every morning.    Marland Kitchen ZIPRASIDONE HCL 80 MG PO CAPS Oral Take 160 mg by mouth daily with supper. Two caps with supper    . ACETAMINOPHEN 325 MG PO TABS Oral Take 650 mg by mouth 2 (two) times daily as needed.      Marland Kitchen BISACODYL 5 MG PO TBEC Oral Take 5 mg  by mouth. 1 tab every 3 days as needed     . CLOTRIMAZOLE-BETAMETHASONE 1-0.05 % EX CREA Topical Apply topically 2 (two) times daily as needed. 45 g 3    BP 116/64  Pulse 76  Temp 97.7 F (36.5 C) (Oral)  Resp 20  Wt 179 lb 0.2 oz (81.2 kg)  SpO2 98%  Physical Exam CONSTITUTIONAL: Well developed/well nourished HEAD AND FACE: Normocephalic/atraumatic EYES: EOMI/PERRL ENMT: Mucous membranes moist NECK: supple no meningeal signs CV: S1/S2 noted, no murmurs/rubs/gallops noted LUNGS: Lungs are clear to auscultation bilaterally, no apparent distress ABDOMEN: soft, nontender, no rebound or guarding GU:no cva tenderness.  Pt has small amt of blood at urethral meatus.  No tenderness noted  No signs of trauma or laceration to penis or scrotum - male chaperone present Rectal - stool color normal NEURO: Pt is awake/alert, moves all extremitiesx4.  He does not speak to me when I speak to him EXTREMITIES: pulses normal, full ROM SKIN: warm, color normal PSYCH: no abnormalities of mood noted  ED Course  Procedures    Labs Reviewed  URINALYSIS, ROUTINE W REFLEX MICROSCOPIC  URINE CULTURE    Cath placed by nursing, urine is yellow, no clots noted.  Foley removed.  Urine culture.  Will defer further workup and refer to urology (has h/o turp previously per chart)  Caregiver reports he is at baseline otherwise  MDM  Nursing notes including past medical history and social history reviewed and considered in documentation Labs/vital reviewed and considered         Joya Gaskins, MD 12/22/11 2324

## 2011-12-22 NOTE — ED Notes (Signed)
Pt resting No distress noted; Neuro at baseline; Pt slow to respond at times

## 2011-12-23 ENCOUNTER — Encounter: Payer: Self-pay | Admitting: Family Medicine

## 2011-12-23 ENCOUNTER — Ambulatory Visit (INDEPENDENT_AMBULATORY_CARE_PROVIDER_SITE_OTHER): Payer: Medicare Other | Admitting: Family Medicine

## 2011-12-23 VITALS — BP 120/70 | HR 78 | Resp 16 | Ht 62.75 in | Wt 179.0 lb

## 2011-12-23 DIAGNOSIS — F29 Unspecified psychosis not due to a substance or known physiological condition: Secondary | ICD-10-CM

## 2011-12-23 DIAGNOSIS — R319 Hematuria, unspecified: Secondary | ICD-10-CM

## 2011-12-23 DIAGNOSIS — N3091 Cystitis, unspecified with hematuria: Secondary | ICD-10-CM

## 2011-12-23 DIAGNOSIS — I10 Essential (primary) hypertension: Secondary | ICD-10-CM

## 2011-12-23 DIAGNOSIS — N3090 Cystitis, unspecified without hematuria: Secondary | ICD-10-CM

## 2011-12-23 DIAGNOSIS — R7302 Impaired glucose tolerance (oral): Secondary | ICD-10-CM

## 2011-12-23 DIAGNOSIS — E785 Hyperlipidemia, unspecified: Secondary | ICD-10-CM

## 2011-12-23 DIAGNOSIS — R7309 Other abnormal glucose: Secondary | ICD-10-CM

## 2011-12-23 NOTE — Progress Notes (Signed)
  Subjective:    Patient ID: Xavier Tanner, male    DOB: 1950/09/29, 61 y.o.   MRN: [id number]  HPI Pt has been to ED for blood in urine reportedly, however ua negative for infection, neeeds to re establish with urology. Denies dysuria or frequency, no fever , chills or flank pain Prior to this he has been stable and doing well   Review of Systems See HPI Denies recent fever or chills. Denies sinus pressure, nasal congestion, ear pain or sore throat. Denies chest congestion, productive cough or wheezing. Denies chest pains, palpitations and leg swelling Denies abdominal pain, nausea, vomiting,diarrhea or constipation.    Denies joint pain, swelling and limitation in mobility. Denies headaches, seizures, numbness, or tingling. Denies uncontrolled depression, anxiety or insomnia. Denies skin break down or rash.        Objective:   Physical Exam Patient alert and oriented and in no cardiopulmonary distress.Mentally retarded and unable to rovide appropriate history  HEENT: No facial asymmetry, EOMI, no sinus tenderness,  oropharynx pink and moist.  Neck supple no adenopathy.  Chest: Clear to auscultation bilaterally.  CVS: S1, S2 no murmurs, no S3.  ABD: Soft non tender. Bowel sounds normal.  Ext: No edema  MS: Adequate ROM spine, shoulders, hips and knees.  Skin: Intact, no ulcerations or rash noted.  Psych: Good eye contact, normal affect. Memory loss not anxious or depressed appearing.  CNS: CN 2-12 intact, power, tone and sensation normal throughout.        Assessment & Plan:

## 2011-12-23 NOTE — Patient Instructions (Addendum)
F/u as before   You are referred to Dr Jerre Simon.  Flu vaccine today.  Urine for c/s only today

## 2011-12-24 LAB — URINE CULTURE: Colony Count: 25000

## 2011-12-25 ENCOUNTER — Ambulatory Visit: Payer: Medicare Other | Admitting: Family Medicine

## 2011-12-25 NOTE — ED Notes (Signed)
+   Urine Chart sent to EDP office for review. 

## 2011-12-26 DIAGNOSIS — N3091 Cystitis, unspecified with hematuria: Secondary | ICD-10-CM | POA: Insufficient documentation

## 2011-12-26 MED ORDER — SULFAMETHOXAZOLE-TRIMETHOPRIM 800-160 MG PO TABS: 1.0000 | ORAL_TABLET | Freq: Two times a day (BID) | ORAL | Status: DC

## 2012-01-02 ENCOUNTER — Telehealth (HOSPITAL_COMMUNITY): Payer: Self-pay | Admitting: *Deleted

## 2012-01-05 NOTE — Assessment & Plan Note (Signed)
Patient educated about the importance of limiting  Carbohydrate intake , the need to commit to daily physical activity for a minimum of 30 minutes , and to commit weight loss. The fact that changes in all these areas will reduce or eliminate all together the development of diabetes is stressed.    

## 2012-01-05 NOTE — Assessment & Plan Note (Signed)
Stable, controlled and treated by mental health

## 2012-01-05 NOTE — Assessment & Plan Note (Signed)
Controlled, no change in medication DASH diet and commitment to daily physical activity for a minimum of 30 minutes discussed and encouraged, as a part of hypertension management. The importance of attaining a healthy weight is also discussed.  

## 2012-01-05 NOTE — Assessment & Plan Note (Signed)
Refer to urology, pt has had this problem in the past

## 2012-01-05 NOTE — Assessment & Plan Note (Signed)
Recent Ed visit, for this complaint, antibiotic not started, has had problems with GU system including treatment for elevated PSA and uTi in the past. Needs app with urologist

## 2012-01-05 NOTE — Assessment & Plan Note (Signed)
Controlled, no change in medication Hyperlipidemia:Low fat diet discussed and encouraged.  \ 

## 2012-01-12 ENCOUNTER — Ambulatory Visit: Payer: Medicare Other | Admitting: Family Medicine

## 2012-01-14 LAB — LIPID PANEL
Cholesterol: 151 mg/dL (ref 0–200)
HDL: 61 mg/dL (ref >39)
Total CHOL/HDL Ratio: 2.5 Ratio

## 2012-01-14 LAB — TSH: TSH: 3.421 u[IU]/mL (ref 0.350–4.500)

## 2012-01-20 ENCOUNTER — Encounter (INDEPENDENT_AMBULATORY_CARE_PROVIDER_SITE_OTHER): Payer: Self-pay | Admitting: *Deleted

## 2012-01-20 ENCOUNTER — Encounter: Payer: Self-pay | Admitting: Family Medicine

## 2012-01-20 ENCOUNTER — Ambulatory Visit (INDEPENDENT_AMBULATORY_CARE_PROVIDER_SITE_OTHER): Payer: Medicare Other | Admitting: Family Medicine

## 2012-01-20 VITALS — BP 120/80 | HR 78 | Resp 15 | Ht 62.75 in | Wt 180.4 lb

## 2012-01-20 DIAGNOSIS — F29 Unspecified psychosis not due to a substance or known physiological condition: Secondary | ICD-10-CM

## 2012-01-20 DIAGNOSIS — R7302 Impaired glucose tolerance (oral): Secondary | ICD-10-CM

## 2012-01-20 DIAGNOSIS — R7309 Other abnormal glucose: Secondary | ICD-10-CM

## 2012-01-20 DIAGNOSIS — I10 Essential (primary) hypertension: Secondary | ICD-10-CM

## 2012-01-20 DIAGNOSIS — Z1211 Encounter for screening for malignant neoplasm of colon: Secondary | ICD-10-CM

## 2012-01-20 DIAGNOSIS — E785 Hyperlipidemia, unspecified: Secondary | ICD-10-CM

## 2012-01-20 DIAGNOSIS — H6120 Impacted cerumen, unspecified ear: Secondary | ICD-10-CM

## 2012-01-20 DIAGNOSIS — E669 Obesity, unspecified: Secondary | ICD-10-CM

## 2012-01-20 DIAGNOSIS — H6121 Impacted cerumen, right ear: Secondary | ICD-10-CM

## 2012-01-20 NOTE — Assessment & Plan Note (Signed)
Normoglycemic off medication for over 6 month

## 2012-01-20 NOTE — Assessment & Plan Note (Signed)
Right ear impacted, succesfull ear irrigation with no trauma

## 2012-01-20 NOTE — Progress Notes (Signed)
Patients right ear was flushed successfully and all wax removed with no complications.

## 2012-01-20 NOTE — Assessment & Plan Note (Signed)
Controlled, no change in medication DASH diet and commitment to daily physical activity for a minimum of 30 minutes discussed and encouraged, as a part of hypertension management. The importance of attaining a healthy weight is also discussed.  

## 2012-01-20 NOTE — Patient Instructions (Signed)
F/u in 5 month, please call if you need me before  You are referred for a colonoscopy   Blood pressure is good and cholesterol excellent.  No med changes  It is important that you exercise regularly at least 30 minutes 5 times a week. If you develop chest pain, have severe difficulty breathing, or feel very tired, stop exercising immediately and seek medical attention

## 2012-01-20 NOTE — Assessment & Plan Note (Signed)
Unchanged. Patient re-educated about  the importance of commitment to a  minimum of 150 minutes of exercise per week. The importance of healthy food choices with portion control discussed. Encouraged to start a food diary, count calories and to consider  joining a support group. Sample diet sheets offered. Goals set by the patient for the next several months.    

## 2012-01-20 NOTE — Progress Notes (Signed)
  Subjective:    Patient ID: Xavier Tanner, male    DOB: 10/01/1950, 61 y.o.   MRN: [id number]  HPI The PT is here for follow up and re-evaluation of chronic medical conditions, medication management and review of any available recent lab and radiology data.  Preventive health is updated, specifically  Cancer screening and Immunization.   Questions or concerns regarding consultations or procedures which the PT has had in the interim are  Addressed.Recently seen by urology, unclear of follow up plan will send for note The PT denies any adverse reactions to current medications since the last visit.  There are no new concerns.  There are no specific complaints       Review of Systems See HPI. Pt has severe MR, history is from caregiver, no concerns at this time Initially should have been a wellness visit, however pt's guardian is not here, he is a ward of the state Denies recent fever or chills. Denies sinus pressure, nasal congestion, ear pain or sore throat. Denies chest congestion, productive cough or wheezing. Denies chest pains, palpitations and leg swelling Denies abdominal pain, nausea, vomiting,diarrhea or constipation.   Denies dysuria, frequency, hesitancy or incontinence. Denies joint pain, swelling mild  limitation in mobility. Denies headaches, seizures, numbness, or tingling. Denies uncontrolled depression, anxiety or insomnia. Denies skin break down or rash.        Objective:   Physical Exam  Patient alert and in no cardiopulmonary distress.  HEENT: No facial asymmetry, EOMI, no sinus tenderness,  oropharynx pink and moist.  Neck decreased ROM no adenopathy.Right ear impacted, successful removal of cerumen, atraumatics  Chest: Clear to auscultation bilaterally.  CVS: S1, S2 no murmurs, no S3.  ABD: Soft non tender. Bowel sounds normal.  Ext: No edema  MS: Adequate ROM spine, shoulders, hips and knees.  Skin: Intact, no ulcerations or rash  noted.  Psych: Good eye contact, Memory loss , not anxious or depressed appearing, blunted affect  CNS: CN 2-12 intact, power, normal throughout.       Assessment & Plan:

## 2012-01-20 NOTE — Assessment & Plan Note (Signed)
Stable and followed by psych 

## 2012-03-29 ENCOUNTER — Other Ambulatory Visit: Payer: Self-pay

## 2012-03-29 MED ORDER — PRAVASTATIN SODIUM 40 MG PO TABS: 40.0000 mg | ORAL_TABLET | Freq: Every day | ORAL | Status: DC

## 2012-03-31 ENCOUNTER — Other Ambulatory Visit: Payer: Self-pay | Admitting: Family Medicine

## 2012-06-23 ENCOUNTER — Ambulatory Visit: Payer: Medicare Other | Admitting: Family Medicine

## 2012-06-30 ENCOUNTER — Ambulatory Visit: Payer: Medicare Other | Admitting: Family Medicine

## 2012-06-30 ENCOUNTER — Other Ambulatory Visit: Payer: Self-pay | Admitting: Family Medicine

## 2012-07-01 ENCOUNTER — Telehealth: Payer: Self-pay | Admitting: Family Medicine

## 2012-07-01 LAB — COMPREHENSIVE METABOLIC PANEL
ALT: 8 U/L (ref 0–53)
AST: 17 U/L (ref 0–37)
Albumin: 4.4 g/dL (ref 3.5–5.2)
BUN: 19 mg/dL (ref 6–23)
CO2: 30 mEq/L (ref 19–32)
Calcium: 9.4 mg/dL (ref 8.4–10.5)
Chloride: 103 mEq/L (ref 96–112)
Creat: 0.99 mg/dL (ref 0.50–1.35)
Potassium: 4.4 mEq/L (ref 3.5–5.3)

## 2012-07-01 LAB — LIPID PANEL
Cholesterol: 134 mg/dL (ref 0–200)
Total CHOL/HDL Ratio: 2.6 Ratio

## 2012-07-01 NOTE — Telephone Encounter (Signed)
pls send for record of visit

## 2012-07-01 NOTE — Telephone Encounter (Signed)
Noted  

## 2012-07-01 NOTE — Telephone Encounter (Signed)
Request sent 

## 2012-07-09 ENCOUNTER — Encounter: Payer: Self-pay | Admitting: Family Medicine

## 2012-07-09 ENCOUNTER — Ambulatory Visit (INDEPENDENT_AMBULATORY_CARE_PROVIDER_SITE_OTHER): Payer: Medicare Other | Admitting: Family Medicine

## 2012-07-09 VITALS — BP 110/78 | HR 80 | Resp 16 | Ht 62.75 in | Wt 179.0 lb

## 2012-07-09 DIAGNOSIS — R32 Unspecified urinary incontinence: Secondary | ICD-10-CM

## 2012-07-09 DIAGNOSIS — R259 Unspecified abnormal involuntary movements: Secondary | ICD-10-CM

## 2012-07-09 DIAGNOSIS — E785 Hyperlipidemia, unspecified: Secondary | ICD-10-CM

## 2012-07-09 DIAGNOSIS — Z1211 Encounter for screening for malignant neoplasm of colon: Secondary | ICD-10-CM

## 2012-07-09 DIAGNOSIS — I10 Essential (primary) hypertension: Secondary | ICD-10-CM

## 2012-07-09 DIAGNOSIS — R251 Tremor, unspecified: Secondary | ICD-10-CM

## 2012-07-09 DIAGNOSIS — F29 Unspecified psychosis not due to a substance or known physiological condition: Secondary | ICD-10-CM

## 2012-07-09 DIAGNOSIS — R7301 Impaired fasting glucose: Secondary | ICD-10-CM

## 2012-07-09 DIAGNOSIS — R5381 Other malaise: Secondary | ICD-10-CM

## 2012-07-09 DIAGNOSIS — R5383 Other fatigue: Secondary | ICD-10-CM

## 2012-07-09 DIAGNOSIS — R7302 Impaired glucose tolerance (oral): Secondary | ICD-10-CM

## 2012-07-09 DIAGNOSIS — R7309 Other abnormal glucose: Secondary | ICD-10-CM

## 2012-07-09 DIAGNOSIS — E669 Obesity, unspecified: Secondary | ICD-10-CM

## 2012-07-09 NOTE — Patient Instructions (Addendum)
Annual wellness in mid August.Please call if you need me before  Labs and blood pressure are excellent.  You are referred to neurology to evaluate tremor, also to [facility] for colon cancer screening   CBC, and HBa1C non fasting in August, before next visit  Keep active, and keep the sugar and carbohydrate in your diet low

## 2012-07-10 NOTE — Assessment & Plan Note (Signed)
Stable, followed by psych in [place]

## 2012-07-10 NOTE — Assessment & Plan Note (Signed)
Unchanged. Patient re-educated about  the importance of commitment to a  minimum of 150 minutes of exercise per week. The importance of healthy food choices with portion control discussed. Encouraged to start a food diary, count calories and to consider  joining a support group. Sample diet sheets offered. Goals set by the patient for the next several months.    

## 2012-07-10 NOTE — Assessment & Plan Note (Signed)
Marked improvement on current meds

## 2012-07-10 NOTE — Assessment & Plan Note (Signed)
Controlled, no change in medication Hyperlipidemia:Low fat diet discussed and encouraged.  \ 

## 2012-07-10 NOTE — Assessment & Plan Note (Signed)
Controlled, no change in medication  

## 2012-07-10 NOTE — Assessment & Plan Note (Signed)
Deterioration, refer for neuro eval

## 2012-07-10 NOTE — Assessment & Plan Note (Signed)
Update dlab next visit Patient educated about the importance of limiting  Carbohydrate intake , the need to commit to daily physical activity for a minimum of 30 minutes , and to commit weight loss. The fact that changes in all these areas will reduce or eliminate all together the development of diabetes is stressed.    

## 2012-07-10 NOTE — Progress Notes (Signed)
  Subjective:    Patient ID: Xavier Tanner, male    DOB: 10/17/1950, 62 y.o.   MRN: [id number]  HPI The PT is here for follow up and re-evaluation of chronic medical conditions, medication management and review of any available recent lab and radiology data.  Preventive health is updated, specifically  Cancer screening and Immunization.   Questions or concerns regarding consultations or procedures which the PT has had in the interim are  Addressed.Has ha 2 urgent care visits, near to his living facility, first for head trauma, the other for bronchitis, no complaints of either at this time, fully resolved  The PT denies any adverse reactions to current medications since the last visit.  C/o worsening tremor, despite recent reduction in psych meds      Review of Systems See HPI provided with assistance of caregiver, pt is mentally challenged Denies recent fever or chills. Denies sinus pressure, nasal congestion, ear pain or sore throat. Denies chest congestion, productive cough or wheezing. Denies chest pains, palpitations and leg swelling Denies abdominal pain, nausea, vomiting,diarrhea or constipation.   Denies dysuria, frequency, hesitancy improvement in  Incontinence recently started on new additional medication Denies joint pain, swelling and limitation in mobility. Denies headaches, seizures, numbness, or tingling. Denies depression, anxiety or insomnia. Denies skin break down or rash.        Objective:   Physical Exam Patient alert and  in no cardiopulmonary distress.  HEENT: No facial asymmetry, EOMI, no sinus tenderness,  oropharynx pink and moist.  Neck supple no adenopathy.  Chest: Clear to auscultation bilaterally.  CVS: S1, S2 no murmurs, no S3.  ABD: Soft non tender. Bowel sounds normal.  Ext: No edema  MS: Adequate ROM spine, shoulders, hips and knees.  Skin: Intact, no ulcerations or rash noted.  Psych: Good eye contact, blunted  affect.  not anxious  or depressed appearing.  CNS: CN 2-12 intact, power, normal throughout.         Assessment & Plan:

## 2012-07-12 ENCOUNTER — Encounter (INDEPENDENT_AMBULATORY_CARE_PROVIDER_SITE_OTHER): Payer: Self-pay | Admitting: *Deleted

## 2012-07-21 ENCOUNTER — Ambulatory Visit (INDEPENDENT_AMBULATORY_CARE_PROVIDER_SITE_OTHER): Payer: Medicare Other | Admitting: Family Medicine

## 2012-07-21 ENCOUNTER — Encounter: Payer: Self-pay | Admitting: Family Medicine

## 2012-07-21 VITALS — BP 120/82 | HR 71 | Resp 16 | Ht 62.75 in | Wt 178.0 lb

## 2012-07-21 DIAGNOSIS — I10 Essential (primary) hypertension: Secondary | ICD-10-CM

## 2012-07-21 DIAGNOSIS — N3000 Acute cystitis without hematuria: Secondary | ICD-10-CM

## 2012-07-21 LAB — POCT URINALYSIS DIPSTICK
Bilirubin, UA: NEGATIVE
Glucose, UA: NEGATIVE
Ketones, UA: NEGATIVE
Protein, UA: NEGATIVE
Spec Grav, UA: 1.025

## 2012-07-21 MED ORDER — CIPROFLOXACIN HCL 500 MG PO TABS: 500.0000 mg | ORAL_TABLET | Freq: Two times a day (BID) | ORAL | Status: AC

## 2012-07-21 NOTE — Assessment & Plan Note (Signed)
Controlled, no change in medication  

## 2012-07-21 NOTE — Assessment & Plan Note (Signed)
abnormal urinalysis with mild flank tenderness, will treat presumptively for uti and f/u culture

## 2012-07-21 NOTE — Patient Instructions (Addendum)
F/u as before  Urine looksas if it may be infected.  Ciprofloxacin 500mg  one twice daily for 5 days is prescribed  You will be contacted with results ofurine culture when available (next week) if any chnge in amnagement.  Pt needs to drink 60 ounces water daily

## 2012-07-21 NOTE — Progress Notes (Signed)
  Subjective:    Patient ID: WALTON DIGILIO, male    DOB: 1950/06/18, 62 y.o.   MRN: [id number]  HPI 3 day h/o left flank swelling per caregiver, no fever, chills, apetite is good, has also noted urine slightly malodorous, ne fever or chills , appetite and energy normal. No Nausea or vomit   Review of Systems See HPI. Pt unablet to provide reliable history Denies recent fever or chills. Denies sinus pressure, nasal congestion, ear pain or sore throat. Denies chest congestion, productive cough or wheezing. Denies chest pains, and leg swelling       Objective:   Physical Exam Patient alert  and in no cardiopulmonary distress.  HEENT: No facial asymmetry, EOMI, no sinus tenderness,  oropharynx pink and moist.  Neck supple no adenopathy.  Chest: Clear to auscultation bilaterally.  CVS: S1, S2 no murmurs, no S3.  ABD: Soft non tender. Bowel sounds normal.No renal angle tenderness  Ext: No edema  MS: Adequate ROM spine, shoulders, hips and knees.  Skin: Intact,       Assessment & Plan:

## 2012-07-24 LAB — URINE CULTURE
Colony Count: NO GROWTH
Organism ID, Bacteria: NO GROWTH

## 2012-08-24 ENCOUNTER — Other Ambulatory Visit: Payer: Self-pay

## 2012-08-24 MED ORDER — PRAVASTATIN SODIUM 40 MG PO TABS: ORAL_TABLET | ORAL | Status: DC

## 2012-08-25 ENCOUNTER — Other Ambulatory Visit: Payer: Self-pay | Admitting: Family Medicine

## 2012-10-21 ENCOUNTER — Other Ambulatory Visit: Payer: Self-pay | Admitting: Family Medicine

## 2012-11-26 ENCOUNTER — Encounter: Payer: Medicare Other | Admitting: Family Medicine

## 2012-12-02 ENCOUNTER — Inpatient Hospital Stay (HOSPITAL_COMMUNITY)
Admission: EM | Admit: 2012-12-02 | Discharge: 2012-12-06 | DRG: 378 | Disposition: A | Discharge status: Nursing Home (Includes ICF) | Payer: Medicare Other | Attending: Internal Medicine | Admitting: Internal Medicine

## 2012-12-02 ENCOUNTER — Encounter (HOSPITAL_COMMUNITY): Payer: Self-pay | Admitting: *Deleted

## 2012-12-02 DIAGNOSIS — D509 Iron deficiency anemia, unspecified: Secondary | ICD-10-CM | POA: Diagnosis present

## 2012-12-02 DIAGNOSIS — F3289 Other specified depressive episodes: Secondary | ICD-10-CM | POA: Diagnosis present

## 2012-12-02 DIAGNOSIS — F29 Unspecified psychosis not due to a substance or known physiological condition: Secondary | ICD-10-CM

## 2012-12-02 DIAGNOSIS — R32 Unspecified urinary incontinence: Secondary | ICD-10-CM

## 2012-12-02 DIAGNOSIS — K922 Gastrointestinal hemorrhage, unspecified: Secondary | ICD-10-CM

## 2012-12-02 DIAGNOSIS — B356 Tinea cruris: Secondary | ICD-10-CM

## 2012-12-02 DIAGNOSIS — R251 Tremor, unspecified: Secondary | ICD-10-CM

## 2012-12-02 DIAGNOSIS — F79 Unspecified intellectual disabilities: Secondary | ICD-10-CM | POA: Diagnosis present

## 2012-12-02 DIAGNOSIS — C182 Malignant neoplasm of ascending colon: Secondary | ICD-10-CM | POA: Diagnosis present

## 2012-12-02 DIAGNOSIS — R7302 Impaired glucose tolerance (oral): Secondary | ICD-10-CM

## 2012-12-02 DIAGNOSIS — T502X5A Adverse effect of carbonic-anhydrase inhibitors, benzothiadiazides and other diuretics, initial encounter: Secondary | ICD-10-CM | POA: Diagnosis present

## 2012-12-02 DIAGNOSIS — B351 Tinea unguium: Secondary | ICD-10-CM

## 2012-12-02 DIAGNOSIS — F329 Major depressive disorder, single episode, unspecified: Secondary | ICD-10-CM | POA: Diagnosis present

## 2012-12-02 DIAGNOSIS — E876 Hypokalemia: Secondary | ICD-10-CM | POA: Diagnosis present

## 2012-12-02 DIAGNOSIS — D630 Anemia in neoplastic disease: Secondary | ICD-10-CM | POA: Diagnosis present

## 2012-12-02 DIAGNOSIS — Z7982 Long term (current) use of aspirin: Secondary | ICD-10-CM

## 2012-12-02 DIAGNOSIS — E785 Hyperlipidemia, unspecified: Secondary | ICD-10-CM

## 2012-12-02 DIAGNOSIS — E559 Vitamin D deficiency, unspecified: Secondary | ICD-10-CM

## 2012-12-02 DIAGNOSIS — E669 Obesity, unspecified: Secondary | ICD-10-CM

## 2012-12-02 DIAGNOSIS — E1169 Type 2 diabetes mellitus with other specified complication: Secondary | ICD-10-CM

## 2012-12-02 DIAGNOSIS — D649 Anemia, unspecified: Secondary | ICD-10-CM

## 2012-12-02 DIAGNOSIS — N3000 Acute cystitis without hematuria: Secondary | ICD-10-CM

## 2012-12-02 DIAGNOSIS — I10 Essential (primary) hypertension: Secondary | ICD-10-CM | POA: Diagnosis present

## 2012-12-02 DIAGNOSIS — R5381 Other malaise: Secondary | ICD-10-CM

## 2012-12-02 DIAGNOSIS — D500 Iron deficiency anemia secondary to blood loss (chronic): Secondary | ICD-10-CM | POA: Diagnosis present

## 2012-12-02 DIAGNOSIS — E119 Type 2 diabetes mellitus without complications: Secondary | ICD-10-CM | POA: Diagnosis present

## 2012-12-02 DIAGNOSIS — F32A Depression, unspecified: Secondary | ICD-10-CM | POA: Diagnosis present

## 2012-12-02 HISTORY — DX: Iron deficiency anemia, unspecified: D50.9

## 2012-12-02 HISTORY — DX: Malignant neoplasm of ascending colon: C18.2

## 2012-12-02 LAB — CBC WITH DIFFERENTIAL/PLATELET
Eosinophils Absolute: 0 10*3/uL (ref 0.0–0.7)
Eosinophils Relative: 0 % (ref 0–5)
Eosinophils Relative: 1 % (ref 0–5)
HCT: 24.2 % — ABNORMAL LOW (ref 39.0–52.0)
Hemoglobin: 6.5 g/dL — CL (ref 13.0–17.0)
Lymphocytes Relative: 27 % (ref 12–46)
Lymphocytes Relative: 29 % (ref 12–46)
Lymphs Abs: 1.6 10*3/uL (ref 0.7–4.0)
MCH: 16.7 pg — ABNORMAL LOW (ref 26.0–34.0)
MCH: 17.5 pg — ABNORMAL LOW (ref 26.0–34.0)
MCV: 56.9 fL — ABNORMAL LOW (ref 78.0–100.0)
Monocytes Absolute: 0.4 10*3/uL (ref 0.1–1.0)
Monocytes Absolute: 0.3 10*3/uL (ref 0.1–1.0)
Monocytes Relative: 6 % (ref 3–12)
Neutrophils Relative %: 65 % (ref 43–77)
Platelets: 363 10*3/uL (ref 150–400)
Platelets: 262 10*3/uL (ref 150–400)
RBC: 4.25 MIL/uL (ref 4.22–5.81)
RBC: 3.71 MIL/uL — ABNORMAL LOW (ref 4.22–5.81)
WBC: 5.7 10*3/uL (ref 4.0–10.5)
WBC: 6.2 10*3/uL (ref 4.0–10.5)

## 2012-12-02 LAB — BASIC METABOLIC PANEL
CO2: 25 mEq/L (ref 19–32)
Calcium: 8.8 mg/dL (ref 8.4–10.5)
GFR calc non Af Amer: 71 mL/min — ABNORMAL LOW (ref >90)
Potassium: 3.4 mEq/L — ABNORMAL LOW (ref 3.5–5.1)
Sodium: 136 mEq/L (ref 135–145)

## 2012-12-02 MED ORDER — SODIUM CHLORIDE 0.9 % IV SOLN
INTRAVENOUS | Status: DC
  Administered 2012-12-03: via INTRAVENOUS

## 2012-12-02 NOTE — ED Notes (Signed)
CRITICAL VALUE ALERT  Critical value received:  HGB 6.5 Date of notification:  12/02/2012  Time of notification:  2257 Critical value read back:yes  Nurse who received alert:  lrt  MD notified (1st page): Dierdre Highman Time of first page: 2300 MD notified (2nd page):  Time of second page:  Responding MD:  Dierdre Highman Time MD responded: 2300

## 2012-12-02 NOTE — ED Provider Notes (Signed)
CSN: [id number]     Arrival date & time 12/02/12  2204 History  This chart was scribed for Sunnie Nielsen, MD by Bennett Scrape, ED Scribe and Dorothey Baseman, ED Scribe. This patient was seen in room APA10/APA10 and the patient's care was started at 11:05 PM.      Chief Complaint  Patient presents with  . abnormal labs    The history is provided by the patient and a caregiver.   HPI Comments: Xavier Tanner is a 61 y.o. male with a h/o MR who presents to the Emergency Department complaining of low hemoglobin. Family with pt states that he was seen by Dr. Lodema Hong today for routine blood work and she was told to bring the pt in tonight when the blood work showed low hemoglobin. She denies seeing a change in his BMs; although, she states that she does not help him with his BMs. She denies any recent syncope, SOB or near syncope. She denies any h/o surgeries.  She states he did received a blood transfusion last year for anemia with no diagnosed cause. The pt denies having any symptoms currently.   Past Medical History  Diagnosis Date  . Urinary incontinence   . Psychotic disorder   . Diabetes mellitus, type 2   . Hypertension   . Bilateral cataracts 2011  . Vitamin D deficiency   . Mental retardation   . Depression   . Prostate cancer    Past Surgical History  Procedure Laterality Date  . Hemorroidectomy  2002  . Transurethral resection of prostate  July 09, 2007   Family History  Problem Relation Age of Onset  . Mental illness Mother   . Hypertension Mother   . Diabetes Mother   . Stroke Father    History  Substance Use Topics  . Smoking status: Never Smoker   . Smokeless tobacco: Not on file  . Alcohol Use: No    Review of Systems  Respiratory: Negative for shortness of breath.   Gastrointestinal: Negative for blood in stool.  Neurological: Negative for syncope and light-headedness.  All other systems reviewed and are negative.    Allergies  Ace inhibitors  Home  Medications   Current Outpatient Rx  Name  Route  Sig  Dispense  Refill  . aspirin 81 MG tablet   Oral   Take 81 mg by mouth every morning.          . ASPIRIN LOW DOSE 81 MG EC tablet      TAKE 1 TABLET BY MOUTH ONCE DAILY. **DO NOT CRUSH**   30 tablet   PRN   . bisacodyl (DULCOLAX) 5 MG EC tablet   Oral   Take 5 mg by mouth. 1 tab every 3 days as needed          . diphenhydrAMINE (SOMINEX) 25 MG tablet   Oral   Take 25 mg by mouth at bedtime as needed.         . divalproex (DEPAKOTE ER) 250 MG 24 hr tablet   Oral   Take 250 mg by mouth. 1 tab in the am and 3 tabs at bedtime         . hydrochlorothiazide (HYDRODIURIL) 25 MG tablet      TAKE 1 TABLET BY MOUTH ONCE DAILY.   30 tablet   PRN   . memantine (NAMENDA) 10 MG tablet   Oral   Take 10 mg by mouth 2 (two) times daily.         Marland Kitchen  omeprazole (PRILOSEC) 20 MG capsule      TAKE 1 CAPSULE BY MOUTH ONCE DAILY. **DO NOT CRUSH**   30 capsule   PRN   . potassium chloride (K-DUR) 10 MEQ tablet      TAKE 2 TABLETS BY MOUTH ONCE DAILY.   60 tablet   PRN   . potassium chloride SA (K-DUR,KLOR-CON) 20 MEQ tablet   Oral   Take 20 mEq by mouth daily.           . pravastatin (PRAVACHOL) 40 MG tablet      TAKE 1 TABLET BY MOUTH AT BEDTIME.   30 tablet   4   . solifenacin (VESICARE) 5 MG tablet   Oral   Take 10 mg by mouth daily.         . tamsulosin (FLOMAX) 0.4 MG CAPS      TAKE 1 CAPSULE BY MOUTH ONCE DAILY. **DO NOT CRUSH**   30 capsule   PRN   . Vitamin D, Ergocalciferol, (DRISDOL) 50000 UNITS CAPS      TAKE 1 CAPSULE BY MOUTH ONCE A MONTH. DO NOT CRUSH.   1 capsule   PRN   . ziprasidone (GEODON) 40 MG capsule   Oral   Take 40 mg by mouth every morning.         . ziprasidone (GEODON) 80 MG capsule   Oral   Take 160 mg by mouth daily with supper. Two caps with supper          Triage Vitals: BP 119/66  Pulse 85  Temp(Src) 98.1 F (36.7 C) (Oral)  Resp 20  Ht 5\' 3"  (1.6 m)   Wt 167 lb 5 oz (75.892 kg)  BMI 29.65 kg/m2  SpO2 100%  Physical Exam  Nursing note and vitals reviewed. Constitutional: He is oriented to person, place, and time. He appears well-developed and well-nourished. No distress.  HENT:  Head: Normocephalic.  Eyes: Pupils are equal, round, and reactive to light. No scleral icterus.  Pale conjunctiva.  Neck: Normal range of motion. Neck supple. No thyromegaly present.  Cardiovascular: Normal rate and regular rhythm.  Exam reveals no gallop and no friction rub.   No murmur heard. Pulmonary/Chest: Effort normal and breath sounds normal. No respiratory distress. He has no wheezes. He has no rales.  Abdominal: Soft. Bowel sounds are normal. He exhibits no distension. There is no tenderness. There is no rebound.  Musculoskeletal: Normal range of motion.  Neurological: He is alert and oriented to person, place, and time.  Skin: Skin is warm and dry. No rash noted.  Psychiatric: He has a normal mood and affect. His behavior is normal.    ED Course   DIAGNOSTIC STUDIES: Oxygen Saturation is 100% on room air, normal by my interpretation.    COORDINATION OF CARE: 11:18 PM-Discussed treatment plan which includes CBC panel, CMP and rectal exam with pt's family at bedside and she agreed to plan.   11:23 PM-RN collected hemoocult, brown stool, guaiac positive.    Procedures (including critical care time)  Results for orders placed during the hospital encounter of 12/02/12  CBC WITH DIFFERENTIAL      Result Value Range   WBC 6.2  4.0 - 10.5 K/uL   RBC 3.71 (*) 4.22 - 5.81 MIL/uL   Hemoglobin 6.5 (*) 13.0 - 17.0 g/dL   HCT 40.9 (*) 81.1 - 91.4 %   MCV 59.0 (*) 78.0 - 100.0 fL   MCH 17.5 (*) 26.0 - 34.0 pg   MCHC  29.7 (*) 30.0 - 36.0 g/dL   RDW 16.1 (*) 09.6 - 04.5 %   Platelets 262  150 - 400 K/uL   Neutrophils Relative % 65  43 - 77 %   Lymphocytes Relative 29  12 - 46 %   Monocytes Relative 5  3 - 12 %   Eosinophils Relative 1  0 - 5 %    Basophils Relative 0  0 - 1 %   Neutro Abs 4.0  1.7 - 7.7 K/uL   Lymphs Abs 1.8  0.7 - 4.0 K/uL   Monocytes Absolute 0.3  0.1 - 1.0 K/uL   Eosinophils Absolute 0.1  0.0 - 0.7 K/uL   Basophils Absolute 0.0  0.0 - 0.1 K/uL   RBC Morphology POLYCHROMASIA PRESENT    BASIC METABOLIC PANEL      Result Value Range   Sodium 136  135 - 145 mEq/L   Potassium 3.4 (*) 3.5 - 5.1 mEq/L   Chloride 100  96 - 112 mEq/L   CO2 25  19 - 32 mEq/L   Glucose, Bld 116 (*) 70 - 99 mg/dL   BUN 17  6 - 23 mg/dL   Creatinine, Ser 4.09  0.50 - 1.35 mg/dL   Calcium 8.8  8.4 - 81.1 mg/dL   GFR calc non Af Amer 71 (*) >90 mL/min   GFR calc Af Amer 83 (*) >90 mL/min  TYPE AND SCREEN      Result Value Range   ABO/RH(D) O POS     Antibody Screen NEG     Sample Expiration 12/05/2012     IVFs  12:16 AM d/w Dr Karilyn Cota, plan admit, blood transfusion initiated  MDM  Anemia, guaiac positive stool - no ABD pain or anemia symptoms Labs, IVFs Transfusion MED admit  I personally performed the services described in this documentation, which was scribed in my presence. The recorded information has been reviewed and is accurate.     Sunnie Nielsen, MD 12/03/12 [phone]

## 2012-12-02 NOTE — ED Notes (Signed)
Pt sent in from Dr. Lodema Hong for abnormal labs. States pt was anemic.

## 2012-12-03 DIAGNOSIS — C19 Malignant neoplasm of rectosigmoid junction: Secondary | ICD-10-CM

## 2012-12-03 DIAGNOSIS — R7309 Other abnormal glucose: Secondary | ICD-10-CM

## 2012-12-03 DIAGNOSIS — D509 Iron deficiency anemia, unspecified: Secondary | ICD-10-CM | POA: Diagnosis present

## 2012-12-03 DIAGNOSIS — R195 Other fecal abnormalities: Secondary | ICD-10-CM

## 2012-12-03 DIAGNOSIS — F79 Unspecified intellectual disabilities: Secondary | ICD-10-CM

## 2012-12-03 DIAGNOSIS — I10 Essential (primary) hypertension: Secondary | ICD-10-CM

## 2012-12-03 DIAGNOSIS — E669 Obesity, unspecified: Secondary | ICD-10-CM

## 2012-12-03 DIAGNOSIS — D649 Anemia, unspecified: Secondary | ICD-10-CM

## 2012-12-03 DIAGNOSIS — E876 Hypokalemia: Secondary | ICD-10-CM

## 2012-12-03 DIAGNOSIS — C182 Malignant neoplasm of ascending colon: Secondary | ICD-10-CM

## 2012-12-03 DIAGNOSIS — E119 Type 2 diabetes mellitus without complications: Secondary | ICD-10-CM

## 2012-12-03 HISTORY — DX: Iron deficiency anemia, unspecified: D50.9

## 2012-12-03 LAB — COMPREHENSIVE METABOLIC PANEL
ALT: 10 U/L (ref 0–53)
AST: 22 U/L (ref 0–37)
Albumin: 3.4 g/dL — ABNORMAL LOW (ref 3.5–5.2)
Alkaline Phosphatase: 54 U/L (ref 39–117)
CO2: 24 mEq/L (ref 19–32)
Chloride: 103 mEq/L (ref 96–112)
Creatinine, Ser: 0.84 mg/dL (ref 0.50–1.35)
GFR calc non Af Amer: >90 mL/min (ref >90)
Potassium: 3.4 mEq/L — ABNORMAL LOW (ref 3.5–5.1)
Sodium: 137 mEq/L (ref 135–145)
Total Bilirubin: 2.7 mg/dL — ABNORMAL HIGH (ref 0.3–1.2)

## 2012-12-03 LAB — RETICULOCYTES
RBC.: 4.39 MIL/uL (ref 4.22–5.81)
Retic Count, Absolute: 57.1 10*3/uL (ref 19.0–186.0)
Retic Ct Pct: 1.3 % (ref 0.4–3.1)

## 2012-12-03 LAB — CBC
Platelets: 263 10*3/uL (ref 150–400)
RBC: 4.36 MIL/uL (ref 4.22–5.81)
RDW: 22.5 % — ABNORMAL HIGH (ref 11.5–15.5)
WBC: 5.8 10*3/uL (ref 4.0–10.5)

## 2012-12-03 LAB — IRON AND TIBC: TIBC: 387 ug/dL (ref 215–435)

## 2012-12-03 LAB — TSH: TSH: 1.207 u[IU]/mL (ref 0.350–4.500)

## 2012-12-03 LAB — VITAMIN B12: Vitamin B-12: 413 pg/mL (ref 211–911)

## 2012-12-03 LAB — FERRITIN: Ferritin: 4 ng/mL — ABNORMAL LOW (ref 22–322)

## 2012-12-03 LAB — ABO/RH: ABO/RH(D): O POS

## 2012-12-03 MED ORDER — SIMVASTATIN 20 MG PO TABS
20.0000 mg | ORAL_TABLET | Freq: Every day | ORAL | Status: DC
  Administered 2012-12-03 – 2012-12-05 (×3): 20 mg via ORAL
  Filled 2012-12-03 (×4): qty 1

## 2012-12-03 MED ORDER — PANTOPRAZOLE SODIUM 40 MG PO TBEC
40.0000 mg | DELAYED_RELEASE_TABLET | Freq: Every day | ORAL | Status: DC
  Administered 2012-12-03 – 2012-12-06 (×4): 40 mg via ORAL
  Filled 2012-12-03 (×4): qty 1

## 2012-12-03 MED ORDER — ONDANSETRON HCL 4 MG PO TABS: 4.0000 mg | ORAL_TABLET | Freq: Four times a day (QID) | ORAL | Status: DC | PRN

## 2012-12-03 MED ORDER — POTASSIUM CHLORIDE CRYS ER 20 MEQ PO TBCR: 20.0000 meq | EXTENDED_RELEASE_TABLET | Freq: Every day | ORAL | Status: DC

## 2012-12-03 MED ORDER — ZIPRASIDONE HCL 80 MG PO CAPS
160.0000 mg | ORAL_CAPSULE | Freq: Every day | ORAL | Status: DC
  Filled 2012-12-03 (×3): qty 2

## 2012-12-03 MED ORDER — HYDROCHLOROTHIAZIDE 25 MG PO TABS
25.0000 mg | ORAL_TABLET | Freq: Every day | ORAL | Status: DC
  Filled 2012-12-03 (×2): qty 1

## 2012-12-03 MED ORDER — DIPHENHYDRAMINE HCL 25 MG PO CAPS: 25.0000 mg | ORAL_CAPSULE | Freq: Every evening | ORAL | Status: DC | PRN

## 2012-12-03 MED ORDER — PANTOPRAZOLE SODIUM 40 MG IV SOLR
40.0000 mg | Freq: Once | INTRAVENOUS | Status: AC
  Administered 2012-12-03: 40 mg via INTRAVENOUS
  Filled 2012-12-03: qty 40

## 2012-12-03 MED ORDER — POTASSIUM CHLORIDE ER 10 MEQ PO TBCR: 10.0000 meq | EXTENDED_RELEASE_TABLET | Freq: Every day | ORAL | Status: DC

## 2012-12-03 MED ORDER — DIVALPROEX SODIUM ER 250 MG PO TB24
250.0000 mg | ORAL_TABLET | Freq: Every day | ORAL | Status: DC
  Administered 2012-12-03: 250 mg via ORAL
  Filled 2012-12-03 (×4): qty 1

## 2012-12-03 MED ORDER — ONDANSETRON HCL 4 MG/2ML IJ SOLN
4.0000 mg | Freq: Four times a day (QID) | INTRAMUSCULAR | Status: DC | PRN
  Administered 2012-12-03: 4 mg via INTRAVENOUS
  Filled 2012-12-03: qty 2

## 2012-12-03 MED ORDER — PEG 3350-KCL-NABCB-NACL-NASULF 236 G PO SOLR
4000.0000 mL | Freq: Once | ORAL | Status: AC
  Administered 2012-12-03: 4000 mL via ORAL
  Filled 2012-12-03: qty 4000

## 2012-12-03 MED ORDER — POTASSIUM CHLORIDE IN NACL 20-0.9 MEQ/L-% IV SOLN
INTRAVENOUS | Status: DC
  Administered 2012-12-03: 11:00:00 via INTRAVENOUS

## 2012-12-03 MED ORDER — CITALOPRAM HYDROBROMIDE 20 MG PO TABS
40.0000 mg | ORAL_TABLET | Freq: Every day | ORAL | Status: DC
  Administered 2012-12-04: 40 mg via ORAL
  Filled 2012-12-03: qty 2

## 2012-12-03 MED ORDER — SODIUM CHLORIDE 0.9 % IV SOLN: INTRAVENOUS | Status: DC

## 2012-12-03 MED ORDER — ZIPRASIDONE HCL 40 MG PO CAPS
40.0000 mg | ORAL_CAPSULE | Freq: Every morning | ORAL | Status: DC
Start: 2012-12-03 — End: 2012-12-03
  Administered 2012-12-03: 40 mg via ORAL
  Filled 2012-12-03 (×4): qty 1

## 2012-12-03 MED ORDER — TAMSULOSIN HCL 0.4 MG PO CAPS
0.4000 mg | ORAL_CAPSULE | Freq: Every day | ORAL | Status: DC
  Administered 2012-12-03 – 2012-12-06 (×4): 0.4 mg via ORAL
  Filled 2012-12-03 (×6): qty 1

## 2012-12-03 MED ORDER — MEMANTINE HCL 10 MG PO TABS
10.0000 mg | ORAL_TABLET | Freq: Two times a day (BID) | ORAL | Status: DC
  Administered 2012-12-03 – 2012-12-06 (×7): 10 mg via ORAL
  Filled 2012-12-03 (×11): qty 1

## 2012-12-03 MED ORDER — DARIFENACIN HYDROBROMIDE ER 7.5 MG PO TB24
15.0000 mg | ORAL_TABLET | Freq: Every day | ORAL | Status: DC
  Administered 2012-12-03 – 2012-12-06 (×4): 15 mg via ORAL
  Filled 2012-12-03 (×3): qty 2
  Filled 2012-12-03 (×2): qty 1
  Filled 2012-12-03: qty 2

## 2012-12-03 MED ORDER — BISACODYL 5 MG PO TBEC: 5.0000 mg | DELAYED_RELEASE_TABLET | Freq: Every day | ORAL | Status: DC | PRN

## 2012-12-03 NOTE — Care Management Note (Addendum)
    Page 1 of 1   12/06/2012     1:33:51 PM   CARE MANAGEMENT NOTE 12/06/2012  Patient:  Xavier Tanner, Xavier Tanner   Account Number:  [id number]  Date Initiated:  12/03/2012  Documentation initiated by:  Rosemary Holms  Subjective/Objective Assessment:   Pt a resident at [facility]. Anticipate DC back to [facility] when medically stable.     Action/Plan:   Anticipated DC Date:  12/06/2012   Anticipated DC Plan:  GROUP HOME  In-house referral  Clinical Social Worker      DC Planning Services  CM consult      Choice offered to / List presented to:             Status of service:  Completed, signed off Medicare Important Message given?   (If response is "NO", the following Medicare IM given date fields will be blank) Date Medicare IM given:   Date Additional Medicare IM given:    Discharge Disposition:  GROUP HOME  Per UR Regulation:    If discussed at Long Length of Stay Meetings, dates discussed:    Comments:  12/06/12 Rosemary Holms RN BSN CM Pt to return to Group home and f/u as outpt with general surgeon tomorrow  12/03/12 Rosemary Holms RN BSN CM

## 2012-12-03 NOTE — Progress Notes (Signed)
The patient is a 62 year old man with a history of mental retardation, psychotic disorder, and hypertension, who was admitted this morning for severe anemia. The patient was briefly seen and examined. His laboratory studies and vital signs were reviewed. Gastroenterology has already seen the patient. Further evaluation pending for consent. He is currently n.p.o. Will, therefore, discontinue oral potassium. We'll add potassium to the IV fluids. We'll also hold hydrochlorothiazide. When he is taking oral liquids or solids, will restart potassium chloride supplementation. He is status post 2 units of packed red blood cells. His hemoglobin has improved to 8.7. His hemoglobin A1c is 6.7. Will start sliding-scale NovoLog when he is no longer n.p.o.

## 2012-12-03 NOTE — Plan of Care (Signed)
Problem: Phase I Progression Outcomes Goal: Voiding-avoid urinary catheter unless indicated Outcome: Completed/Met Date Met:  12/03/12 voiding Goal: Other Phase I Outcomes/Goals Outcome: Completed/Met Date Met:  12/03/12 H/H normalizing

## 2012-12-03 NOTE — ED Notes (Signed)
Melissa Price of social services phoned and gave verbal consent over the phone to transfuse blood to patient since he was ward of the state

## 2012-12-03 NOTE — Progress Notes (Signed)
Discussed with Arline Asp, RN. Consent for colonoscopy with possible EGD obtained. [name] with DSS, cell [phone].

## 2012-12-03 NOTE — H&P (Signed)
[facility] History and Physical  Xavier Tanner UUV:[id number] DOB: 30-Jul-1950 DOA: 12/02/2012  Referring physician: Dr. Dierdre Highman, ER. PCP: Syliva Overman, MD    Chief Complaint: Abnormal labs with significant anemia.  HPI: Xavier Tanner is a 62 y.o. male who went for blood work prior to outpatient appointment with his primary care physician. He did not have any symptoms. The patient has mental retardation and lives in a group home. There is no history of hematemesis, melena or abdominal pain. There is no evidence of hematuria. The patient was found to have a hemoglobin that was reduced. In the emergency room, his hemoglobin is 6.5 with microcytosis. He is now being admitted for further evaluation.   Review of Systems: Apart from history of present illness, other systems negative. Due to his mental retardation, a full history is not obtainable.  Past Medical History  Diagnosis Date  . Urinary incontinence   . Psychotic disorder   . Diabetes mellitus, type 2   . Hypertension   . Bilateral cataracts 2011  . Vitamin D deficiency   . Mental retardation   . Depression   . Prostate cancer    Past Surgical History  Procedure Laterality Date  . Hemorroidectomy  2002  . Transurethral resection of prostate  July 09, 2007   Social History:  Patient lives in a group home. He is apparently independent with ADLs. He does not smoke cigarettes. He does not drink alcohol.  Allergies  Allergen Reactions  . Ace Inhibitors     REACTION: cough    Family History  Problem Relation Age of Onset  . Mental illness Mother   . Hypertension Mother   . Diabetes Mother   . Stroke Father       Prior to Admission medications   Medication Sig Start Date End Date Taking? Authorizing Provider  aspirin 81 MG tablet Take 81 mg by mouth every morning.     Historical Provider, MD  ASPIRIN LOW DOSE 81 MG EC tablet TAKE 1 TABLET BY MOUTH ONCE DAILY. **DO NOT CRUSH** 10/21/12   Kerri Perches, MD  bisacodyl (DULCOLAX) 5 MG EC tablet Take 5 mg by mouth. 1 tab every 3 days as needed     Historical Provider, MD  diphenhydrAMINE (SOMINEX) 25 MG tablet Take 25 mg by mouth at bedtime as needed.    Historical Provider, MD  divalproex (DEPAKOTE ER) 250 MG 24 hr tablet Take 250 mg by mouth. 1 tab in the am and 3 tabs at bedtime    Historical Provider, MD  hydrochlorothiazide (HYDRODIURIL) 25 MG tablet TAKE 1 TABLET BY MOUTH ONCE DAILY. 10/21/12   Kerri Perches, MD  memantine (NAMENDA) 10 MG tablet Take 10 mg by mouth 2 (two) times daily.    Historical Provider, MD  omeprazole (PRILOSEC) 20 MG capsule TAKE 1 CAPSULE BY MOUTH ONCE DAILY. **DO NOT CRUSH** 10/21/12   Kerri Perches, MD  potassium chloride (K-DUR) 10 MEQ tablet TAKE 2 TABLETS BY MOUTH ONCE DAILY. 10/21/12   Kerri Perches, MD  potassium chloride SA (K-DUR,KLOR-CON) 20 MEQ tablet Take 20 mEq by mouth daily.      Historical Provider, MD  pravastatin (PRAVACHOL) 40 MG tablet TAKE 1 TABLET BY MOUTH AT BEDTIME. 08/25/12   Kerri Perches, MD  solifenacin (VESICARE) 5 MG tablet Take 10 mg by mouth daily.    Historical Provider, MD  tamsulosin (FLOMAX) 0.4 MG CAPS TAKE 1 CAPSULE BY MOUTH ONCE DAILY. **DO NOT CRUSH** 10/21/12  Kerri Perches, MD  Vitamin D, Ergocalciferol, (DRISDOL) 50000 UNITS CAPS TAKE 1 CAPSULE BY MOUTH ONCE A MONTH. DO NOT CRUSH. 10/21/12   Kerri Perches, MD  ziprasidone (GEODON) 40 MG capsule Take 40 mg by mouth every morning.    Historical Provider, MD  ziprasidone (GEODON) 80 MG capsule Take 160 mg by mouth daily with supper. Two caps with supper    Historical Provider, MD   Physical Exam: Filed Vitals:   12/02/12 2211  BP: 119/66  Pulse: 85  Temp: 98.1 F (36.7 C)  Resp: 20     General:  He looks systemically well, somewhat pale.  Eyes: Pallor. No jaundice.  ENT: No abnormalities.  Neck: No lymphadenopathy.  Cardiovascular: Heart sounds present and in sinus rhythm. There  are no murmurs. No evidence of heart failure.  Respiratory: Lung fields are clear.  Abdomen: Soft and nontender. No thyromegaly. No masses felt. Fecal occult blood testing by the emergency room was positive.  Skin: No rash.  Musculoskeletal: No acute joint abnormalities.  Psychiatric: Not examined in view of the patient's mental retardation.  Neurologic: No obvious acute neurological deficit.  Labs on Admission:  Basic Metabolic Panel:  Recent Labs Lab 12/02/12 2305  NA 136  K 3.4*  CL 100  CO2 25  GLUCOSE 116*  BUN 17  CREATININE 1.07  CALCIUM 8.8       CBC:  Recent Labs Lab 12/02/12 1015 12/02/12 2245  WBC 5.7 6.2  NEUTROABS 3.7 4.0  HGB 7.1* 6.5*  HCT 24.2* 21.9*  MCV 56.9* 59.0*  PLT 363 262           Assessment/Plan   1. Microcytic anemia, hemoglobin 6.5. MCV 59.0. Hemoccult positive. No acute GI bleed. 2. Hypertension. 3. Impaired glucose tolerance. 4. Mental retardation.  Plan: 1. Admit to medical floor. 2. Transfuse 2 units of blood in view of significantly reduced hemoglobin. 3. Anemia panel. 4. Gastroenterology consultation with a view to EGD and colonoscopy. Further recommendations will depend on patient's hospital progress.   Code Status: Full code.   Family Communication: Discussed plan with patient's caregiver at the bedside.   Disposition Plan: Back to the group home when medically stable.  Time spent: 60 minutes.  Wilson Singer [facility] Pager [phone].  If 7PM-7AM, please contact night-coverage [email or web address] Password [facility] 12/03/2012, 1:04 AM

## 2012-12-03 NOTE — Clinical Social Work Psychosocial (Signed)
    Clinical Social Work Department BRIEF PSYCHOSOCIAL ASSESSMENT 12/03/2012  Patient:  Xavier Tanner, Xavier Tanner     Account Number:  [id number]     Admit date:  12/02/2012  Clinical Social Worker:  Santa Genera, CLINICAL SOCIAL WORKER  Date/Time:  12/03/2012 03:30 PM  Referred by:  RN  Date Referred:  12/03/2012 Referred for  ALF Placement   Other Referral:   Interview type:  Patient Other interview type:   Also spoke w DSS guardian, [name], and facility SW, Albertine Patricia    PSYCHOSOCIAL DATA Living Status:  FACILITY Admitted from facility:  OTHER Level of care:  Assisted Living Primary support name:  [place] DSS Primary support relationship to patient:  NONE Degree of support available:   From [facility], ward of state    CURRENT CONCERNS Current Concerns  Post-Acute Placement   Other Concerns:    SOCIAL WORK ASSESSMENT / PLAN CSW met w patient at bedside, patient found w sheet over head attempting to sleep.  Did not want to participate in assessment, has mental retardation.  Spoke w Katina Degree, DSS guardian.  Patient has been ward of state for many years due to mental retardation. Mother lives at [facility], patient visits weekly.  Patient currently lives at [facility] and DSS is pleased w placement, want patient to return at discharge.  Patient is fully ambulatory, eats well by himself, takes care of all ADLs w verbal cues and supervision.  Per facility SW, patient has had tendency to wander in past but this is not a current problem.  Both facility and DSS guardian are agreeable to patient return at discharge, no concerns expressed. Guardian asked that [facility] contact them for consent when needed and keep informed of discharge plans.   Assessment/plan status:  Psychosocial Support/Ongoing Assessment of Needs Other assessment/ plan:   Information/referral to community resources:   None needed at this time.    PATIENT'S/FAMILY'S  RESPONSE TO PLAN OF CARE: DSS guardian agreeable to plan to return to [facility] at discharge.        Santa Genera, LCSW Clinical Social Worker [phone])

## 2012-12-03 NOTE — Progress Notes (Signed)
Bag 1 of 2 PRBC's completed, preparing for bag # 2, no adverse reaction, vital signs stable.

## 2012-12-03 NOTE — Progress Notes (Signed)
Unit 1 of 2 PRBC's started, [facility] caregiver at bedside, consent for blood was obtained by ED staff via telephone by ward of state representative, see notes.

## 2012-12-03 NOTE — Progress Notes (Signed)
Utilization Review Complete  

## 2012-12-03 NOTE — Consult Note (Signed)
Referring Provider: Elliot Cousin, MD Primary Care Physician:  Syliva Overman, MD Primary Gastroenterologist:  Jonette Eva, MD  Reason for Consultation:  Heme positive stool, microcytic anemia  HPI: Xavier Tanner is a 62 y.o. male admitted yesterday with profound anemia. He has mental retardation and lives in a group home ([facility]). He is ward of the state. Outpatient labs yesterday showed a hemoglobin of 7.1, hematocrit 24.2, MCV 56.9. Repeat labs later yesterday evening showed a hemoglobin of 6.5, hematocrit 21.9. Anemia profile pending. One year ago hemoglobin was 13.2, MCV 81.6.  In 2009, he received blood transfusion related to hematuria, prostate surgery. Within Epic, I do not see a prior colonoscopy or upper endoscopy. He takes aspirin daily, no other NSAIDs listed.  Family history limited to what is listed below. Patient unable to provide.   Prior to Admission medications   Medication Sig Start Date End Date Taking? Authorizing Provider  aspirin 81 MG tablet Take 81 mg by mouth every morning.     Historical Provider, MD  ASPIRIN LOW DOSE 81 MG EC tablet TAKE 1 TABLET BY MOUTH ONCE DAILY. **DO NOT CRUSH** 10/21/12   Kerri Perches, MD  bisacodyl (DULCOLAX) 5 MG EC tablet Take 5 mg by mouth. 1 tab every 3 days as needed     Historical Provider, MD  diphenhydrAMINE (SOMINEX) 25 MG tablet Take 25 mg by mouth at bedtime as needed.    Historical Provider, MD  divalproex (DEPAKOTE ER) 250 MG 24 hr tablet Take 250 mg by mouth. 1 tab in the am and 3 tabs at bedtime    Historical Provider, MD  hydrochlorothiazide (HYDRODIURIL) 25 MG tablet TAKE 1 TABLET BY MOUTH ONCE DAILY. 10/21/12   Kerri Perches, MD  memantine (NAMENDA) 10 MG tablet Take 10 mg by mouth 2 (two) times daily.    Historical Provider, MD  omeprazole (PRILOSEC) 20 MG capsule TAKE 1 CAPSULE BY MOUTH ONCE DAILY. **DO NOT CRUSH** 10/21/12   Kerri Perches, MD  potassium chloride (K-DUR) 10 MEQ tablet TAKE 2 TABLETS BY  MOUTH ONCE DAILY. 10/21/12   Kerri Perches, MD  potassium chloride SA (K-DUR,KLOR-CON) 20 MEQ tablet Take 20 mEq by mouth daily.      Historical Provider, MD  pravastatin (PRAVACHOL) 40 MG tablet TAKE 1 TABLET BY MOUTH AT BEDTIME. 08/25/12   Kerri Perches, MD  solifenacin (VESICARE) 5 MG tablet Take 10 mg by mouth daily.    Historical Provider, MD  tamsulosin (FLOMAX) 0.4 MG CAPS TAKE 1 CAPSULE BY MOUTH ONCE DAILY. **DO NOT CRUSH** 10/21/12   Kerri Perches, MD  Vitamin D, Ergocalciferol, (DRISDOL) 50000 UNITS CAPS TAKE 1 CAPSULE BY MOUTH ONCE A MONTH. DO NOT CRUSH. 10/21/12   Kerri Perches, MD  ziprasidone (GEODON) 40 MG capsule Take 40 mg by mouth every morning.    Historical Provider, MD  ziprasidone (GEODON) 80 MG capsule Take 160 mg by mouth daily with supper. Two caps with supper    Historical Provider, MD    Current Facility-Administered Medications  Medication Dose Route Frequency Provider Last Rate Last Dose  . 0.9 % NaCl with KCl 20 mEq/ L  infusion   Intravenous Continuous Elliot Cousin, MD      . bisacodyl (DULCOLAX) EC tablet 5 mg  5 mg Oral Daily PRN Nimish C Gosrani, MD      . darifenacin (ENABLEX) 24 hr tablet 15 mg  15 mg Oral Daily Nimish Normajean Glasgow, MD      .  diphenhydrAMINE (BENADRYL) capsule 25 mg  25 mg Oral QHS PRN Nimish Normajean Glasgow, MD      . divalproex (DEPAKOTE ER) 24 hr tablet 250 mg  250 mg Oral Daily Nimish C Gosrani, MD      . memantine (NAMENDA) tablet 10 mg  10 mg Oral BID Nimish C Gosrani, MD      . ondansetron (ZOFRAN) tablet 4 mg  4 mg Oral Q6H PRN Nimish Normajean Glasgow, MD       Or  . ondansetron (ZOFRAN) injection 4 mg  4 mg Intravenous Q6H PRN Nimish C Gosrani, MD      . pantoprazole (PROTONIX) EC tablet 40 mg  40 mg Oral Daily Nimish C Gosrani, MD      . simvastatin (ZOCOR) tablet 20 mg  20 mg Oral q1800 Nimish C Gosrani, MD      . tamsulosin (FLOMAX) capsule 0.4 mg  0.4 mg Oral Daily Nimish C Gosrani, MD      . ziprasidone (GEODON) capsule 160  mg  160 mg Oral Q supper Nimish C Gosrani, MD      . ziprasidone (GEODON) capsule 40 mg  40 mg Oral q morning - 10a Nimish Normajean Glasgow, MD        Allergies as of 12/02/2012 - Review Complete 12/02/2012  Allergen Reaction Noted  . Ace inhibitors  04/23/2007    Past Medical History  Diagnosis Date  . Urinary incontinence   . Psychotic disorder   . Diabetes mellitus, type 2   . Hypertension   . Bilateral cataracts 2011  . Vitamin D deficiency   . Mental retardation   . Depression   . Prostate cancer     Past Surgical History  Procedure Laterality Date  . Hemorroidectomy  2002  . Transurethral resection of prostate  July 09, 2007    Family History  Problem Relation Age of Onset  . Mental illness Mother   . Hypertension Mother   . Diabetes Mother   . Stroke Father     History   Social History  . Marital Status: Single    Spouse Name: N/A    Number of Children: N/A  . Years of Education: N/A   Occupational History  . Not on file.   Social History Main Topics  . Smoking status: Never Smoker   . Smokeless tobacco: Not on file  . Alcohol Use: No  . Drug Use: No  . Sexual Activity: Not on file   Other Topics Concern  . Not on file   Social History Narrative  . No narrative on file     ROS:  General: Negative for anorexia, weight loss, fever, chills, fatigue, weakness. Eyes: Negative for vision changes.  ENT: Negative for hoarseness, difficulty swallowing , nasal congestion. CV: Negative for chest pain, angina, palpitations, dyspnea on exertion, peripheral edema.  Respiratory: Negative for dyspnea at rest, dyspnea on exertion, cough, sputum, wheezing.  GI: See history of present illness. GU:  Negative for dysuria, hematuria, urinary incontinence, urinary frequency, nocturnal urination.  MS: Negative for joint pain, low back pain.  Derm: Negative for rash or itching.  Neuro: Negative for weakness, abnormal sensation, seizure, frequent headaches, memory loss,  confusion.  Psych: Negative for anxiety, depression, suicidal ideation, hallucinations.  Endo: Negative for unusual weight change.  Heme: Negative for bruising or bleeding. Allergy: Negative for rash or hives.       Physical Examination: Vital signs in last 24 hours: Temp:  [97.1 F (36.2 C)-98.4 F (  36.9 C)] 98 F (36.7 C) (08/22 0748) Pulse Rate:  [72-85] 77 (08/22 0748) Resp:  [18-20] 18 (08/22 0748) BP: (113-122)/(57-74) 120/67 mmHg (08/22 0748) SpO2:  [99 %-100 %] 100 % (08/22 0130) Weight:  [166 lb 0.1 oz (75.3 kg)-167 lb 5 oz (75.892 kg)] 166 lb 0.1 oz (75.3 kg) (08/22 0130) Last BM Date: 12/02/12  General: Well-nourished, well-developed in no acute distress.  Head: Normocephalic, atraumatic.   Eyes: Conjunctiva pink, no icterus. Mouth: Oropharyngeal mucosa moist and pink , no lesions erythema or exudate. Neck: Supple without thyromegaly, masses, or lymphadenopathy.  Lungs: Clear to auscultation bilaterally.  Heart: Regular rate and rhythm, no murmurs rubs or gallops.  Abdomen: Bowel sounds are normal, nontender, nondistended, no hepatosplenomegaly or masses, no abdominal bruits or    hernia , no rebound or guarding.   Rectal: Not performed Extremities: No lower extremity edema, clubbing, deformity.  Neuro: Alert and oriented x 4 , grossly normal neurologically.  Skin: Warm and dry, no rash or jaundice.   Psych: Alert and cooperative, normal mood and affect.        Intake/Output from previous day:   Intake/Output this shift:    Lab Results: CBC  Recent Labs  12/02/12 1015 12/02/12 2245  WBC 5.7 6.2  HGB 7.1* 6.5*  HCT 24.2* 21.9*  MCV 56.9* 59.0*  PLT 363 262   BMET  Recent Labs  12/02/12 2305  NA 136  K 3.4*  CL 100  CO2 25  GLUCOSE 116*  BUN 17  CREATININE 1.07  CALCIUM 8.8   Impression: 62 year old gentleman with microcytic anemia, Hemoccult-positive stool. One year ago hemoglobin was normal. There has been no report of overt GI bleeding.  Hemoglobin has declined somewhat since admission, questionable related to dilution. No evidence of melena or bleeding since admission. Patient denies GI symptoms but history limited due to mental retardation.  In this clinical scenario, would offer her colonoscopy with possible upper endoscopy to evaluate Hemoccult-positive stool, microcytic anemia. Given patient is unable to provide consent, we will need to work through social services.  Plan: 1. Discussed with Dr. Sherrie Mustache. Plan for colonoscopy with possible upper endoscopy once consent to be obtained. 2. Recheck CBC now. 3. Agree with PPI.  I would like to thank Dr. Sherrie Mustache for allowing [place] to take part in the care of this nice patient.   LOS: 1 day   Tana Coast  12/03/2012, 8:13 AM

## 2012-12-03 NOTE — Consult Note (Signed)
REVIEWED. AGREE. 

## 2012-12-04 ENCOUNTER — Encounter (HOSPITAL_COMMUNITY): Payer: Self-pay | Admitting: *Deleted

## 2012-12-04 ENCOUNTER — Encounter (HOSPITAL_COMMUNITY)
Admission: EM | Disposition: A | Discharge status: Nursing Home (Includes ICF) | Payer: Self-pay | Source: Home / Self Care | Attending: Internal Medicine

## 2012-12-04 DIAGNOSIS — D500 Iron deficiency anemia secondary to blood loss (chronic): Secondary | ICD-10-CM | POA: Diagnosis present

## 2012-12-04 DIAGNOSIS — D509 Iron deficiency anemia, unspecified: Secondary | ICD-10-CM

## 2012-12-04 DIAGNOSIS — R195 Other fecal abnormalities: Secondary | ICD-10-CM

## 2012-12-04 DIAGNOSIS — E876 Hypokalemia: Secondary | ICD-10-CM

## 2012-12-04 DIAGNOSIS — F329 Major depressive disorder, single episode, unspecified: Secondary | ICD-10-CM | POA: Diagnosis present

## 2012-12-04 DIAGNOSIS — E119 Type 2 diabetes mellitus without complications: Secondary | ICD-10-CM

## 2012-12-04 DIAGNOSIS — C19 Malignant neoplasm of rectosigmoid junction: Secondary | ICD-10-CM

## 2012-12-04 DIAGNOSIS — E669 Obesity, unspecified: Secondary | ICD-10-CM

## 2012-12-04 DIAGNOSIS — C182 Malignant neoplasm of ascending colon: Secondary | ICD-10-CM

## 2012-12-04 HISTORY — PX: COLONOSCOPY: SHX5424

## 2012-12-04 HISTORY — PX: ESOPHAGOGASTRODUODENOSCOPY: SHX5428

## 2012-12-04 HISTORY — DX: Malignant neoplasm of ascending colon: C18.2

## 2012-12-04 LAB — BASIC METABOLIC PANEL
BUN: 7 mg/dL (ref 6–23)
Chloride: 107 mEq/L (ref 96–112)
GFR calc Af Amer: >90 mL/min (ref >90)
GFR calc non Af Amer: >90 mL/min (ref >90)
Potassium: 2.8 mEq/L — ABNORMAL LOW (ref 3.5–5.1)
Sodium: 141 mEq/L (ref 135–145)

## 2012-12-04 LAB — CBC
Hemoglobin: 8.1 g/dL — ABNORMAL LOW (ref 13.0–17.0)
MCH: 19.8 pg — ABNORMAL LOW (ref 26.0–34.0)
Platelets: 255 10*3/uL (ref 150–400)
RBC: 4.09 MIL/uL — ABNORMAL LOW (ref 4.22–5.81)
WBC: 5.7 10*3/uL (ref 4.0–10.5)

## 2012-12-04 LAB — TYPE AND SCREEN
ABO/RH(D): O POS
Antibody Screen: NEGATIVE
Unit division: 0

## 2012-12-04 LAB — MAGNESIUM: Magnesium: 2.2 mg/dL (ref 1.5–2.5)

## 2012-12-04 SURGERY — COLONOSCOPY
Anesthesia: Moderate Sedation

## 2012-12-04 MED ORDER — CITALOPRAM HYDROBROMIDE 20 MG PO TABS: 10.0000 mg | ORAL_TABLET | Freq: Every day | ORAL | Status: DC

## 2012-12-04 MED ORDER — POTASSIUM CHLORIDE 10 MEQ/100ML IV SOLN
10.0000 meq | INTRAVENOUS | Status: AC
  Administered 2012-12-04 (×4): 10 meq via INTRAVENOUS
  Filled 2012-12-04 (×4): qty 100

## 2012-12-04 MED ORDER — CITALOPRAM HYDROBROMIDE 20 MG PO TABS
30.0000 mg | ORAL_TABLET | Freq: Every day | ORAL | Status: DC
  Administered 2012-12-05 – 2012-12-06 (×2): 30 mg via ORAL
  Filled 2012-12-04 (×2): qty 2

## 2012-12-04 MED ORDER — POTASSIUM CHLORIDE 10 MEQ/100ML IV SOLN: 10.0000 meq | INTRAVENOUS | Status: DC

## 2012-12-04 MED ORDER — CITALOPRAM HYDROBROMIDE 20 MG PO TABS: 20.0000 mg | ORAL_TABLET | Freq: Every day | ORAL | Status: DC

## 2012-12-04 MED ORDER — MIDAZOLAM HCL 5 MG/5ML IJ SOLN
INTRAMUSCULAR | Status: AC
  Filled 2012-12-04: qty 10

## 2012-12-04 MED ORDER — SPOT INK MARKER SYRINGE KIT
PACK | SUBMUCOSAL | Status: DC | PRN
  Administered 2012-12-04: 4 mL via SUBMUCOSAL

## 2012-12-04 MED ORDER — MEPERIDINE HCL 100 MG/ML IJ SOLN
INTRAMUSCULAR | Status: AC
  Filled 2012-12-04: qty 2

## 2012-12-04 MED ORDER — STERILE WATER FOR IRRIGATION IR SOLN
Status: DC | PRN
  Administered 2012-12-04: 09:00:00

## 2012-12-04 MED ORDER — SODIUM CHLORIDE 0.9 % IV SOLN: INTRAVENOUS | Status: DC

## 2012-12-04 MED ORDER — MIDAZOLAM HCL 5 MG/5ML IJ SOLN
INTRAMUSCULAR | Status: DC | PRN
  Administered 2012-12-04: 2 mg via INTRAVENOUS

## 2012-12-04 MED ORDER — ONDANSETRON HCL 4 MG/2ML IJ SOLN
INTRAMUSCULAR | Status: AC
  Filled 2012-12-04: qty 2

## 2012-12-04 MED ORDER — BUTAMBEN-TETRACAINE-BENZOCAINE 2-2-14 % EX AERO
INHALATION_SPRAY | CUTANEOUS | Status: DC | PRN
  Administered 2012-12-04: 2 via TOPICAL

## 2012-12-04 MED ORDER — ONDANSETRON HCL 4 MG/2ML IJ SOLN
INTRAMUSCULAR | Status: DC | PRN
  Administered 2012-12-04: 4 mg via INTRAVENOUS

## 2012-12-04 MED ORDER — MEPERIDINE HCL 100 MG/ML IJ SOLN
INTRAMUSCULAR | Status: DC | PRN
  Administered 2012-12-04: 50 mg via INTRAVENOUS

## 2012-12-04 MED ORDER — KCL IN DEXTROSE-NACL 20-5-0.9 MEQ/L-%-% IV SOLN
INTRAVENOUS | Status: DC
  Administered 2012-12-04 – 2012-12-05 (×2): via INTRAVENOUS

## 2012-12-04 NOTE — Progress Notes (Addendum)
[facility] PROGRESS NOTE  Xavier Tanner ZOX:[id number] DOB: April 23, 1950 DOA: 12/02/2012 PCP: Syliva Overman, MD   Code Status:  Full code Family Communication: None available Disposition Plan: To be determined   Consultants:  Gastroenterology  Procedures:  Colonoscopy with biopsy on 12/04/2012, Dr. Katha Cabal cm x 3-4 cm semilunar, ulcerated, neoplastic-appearing process in the ascending colon; the remainder of the colonic mucosa appeared normal. Status post biopsy of lesion.  Antibiotics:  None  HPI/Subjective:  The patient is a 62 year old man with a history of mental retardation, psychotic disorder, and hypertension, who was admitted on 12/02/2012 for severe anemia per laboratory studies in the outpatient setting. His hemoglobin was 6.5 on admission. Colonoscopy on 12/04/2012 reveals an ascending colon ulcerated mass, consistent with malignancy.  The patient is recently come back to his room from colonoscopy. He denies pain or chest congestion. When asked, he does acknowledge that he is hungry.  Objective: Filed Vitals:   12/04/12 1025  BP: 103/52  Pulse: 66  Temp: 97.6 F (36.4 C)  Resp: 17    Intake/Output Summary (Last 24 hours) at 12/04/12 1228 Last data filed at 12/04/12 0845  Gross per 24 hour  Intake   2165 ml  Output   1200 ml  Net    965 ml   Filed Weights   12/02/12 2211 12/03/12 0130  Weight: 75.892 kg (167 lb 5 oz) 75.3 kg (166 lb 0.1 oz)    Exam:   General:  Mildly obese 62 year old Caucasian man laying in bed, in no acute distress.  Cardiovascular: S1, S2, with a soft systolic murmur.  Respiratory: Decreased breath sounds in the bases, otherwise clear.  Abdomen: Positive bowel sounds, obese, nontender, nondistended.  Musculoskeletal: No acute hot red joints.  Extremities: No pedal edema.  Neurologic: He is alert and oriented to himself and hospital. Cranial nerves II through XII are grossly intact with exception of decrease in  visual acuity..  Data Reviewed: Basic Metabolic Panel:  Recent Labs Lab 12/02/12 2305 12/03/12 0852 12/04/12 0633  NA 136 137 141  K 3.4* 3.4* 2.8*  CL 100 103 107  CO2 25 24 27   GLUCOSE 116* 101* 89  BUN 17 14 7   CREATININE 1.07 0.84 0.80  CALCIUM 8.8 8.6 8.3*  MG  --   --  2.2   Liver Function Tests:  Recent Labs Lab 12/03/12 0852  AST 22  ALT 10  ALKPHOS 54  BILITOT 2.7*  PROT 7.2  ALBUMIN 3.4*   No results found for this basename: LIPASE, AMYLASE,  in the last 168 hours No results found for this basename: AMMONIA,  in the last 168 hours CBC:  Recent Labs Lab 12/02/12 1015 12/02/12 2245 12/03/12 0852 12/04/12 0633  WBC 5.7 6.2 5.8 5.7  NEUTROABS 3.7 4.0  --   --   HGB 7.1* 6.5* 8.7* 8.1*  HCT 24.2* 21.9* 28.0* 26.1*  MCV 56.9* 59.0* 64.2* 63.8*  PLT 363 262 263 255   Cardiac Enzymes: No results found for this basename: CKTOTAL, CKMB, CKMBINDEX, TROPONINI,  in the last 168 hours BNP (last 3 results) No results found for this basename: PROBNP,  in the last 8760 hours CBG: No results found for this basename: GLUCAP,  in the last 168 hours  No results found for this or any previous visit (from the past 240 hour(s)).   Studies: No results found.  Scheduled Meds: . citalopram  40 mg Oral Daily  . darifenacin  15 mg Oral Daily  . memantine  10 mg Oral BID  . meperidine      . midazolam      . ondansetron      . pantoprazole  40 mg Oral Daily  . potassium chloride  10 mEq Intravenous Q1 Hr x 4  . simvastatin  20 mg Oral q1800  . tamsulosin  0.4 mg Oral Daily   Continuous Infusions: . dextrose 5 % and 0.9 % NaCl with KCl 20 mEq/L 75 mL/hr at 12/04/12 1115    Assessment:  Principal Problem:   Colon cancer, ascending Active Problems:   Microcytic anemia   Hypokalemia   HYPERTENSION   Mental retardation   Diabetes mellitus type 2 in obese   Depression   1. Ascending colon cancer. Per colonoscopy. Biopsies taken and results are pending.  General surgery will be consulted. Appreciate gastroenterology's assistance.  Iron deficiency microcytic anemia. Secondary to chronic GI blood loss from colon cancer. Anemia panel noted for a ferritin of 4. Status post 2 units of packed red blood cells. His hemoglobin was 6.5 on admission. It has improved appropriately.  Hypokalemia. Possibly secondary to hydrochlorothiazide and IV fluids. His magnesium level is within normal limits. Hydrochlorothiazide was discontinued. We'll continue potassium chloride IV runs as tolerated.  Hypertension. Controlled off of hydrochlorothiazide.   Type 2 diabetes mellitus. Apparently diet controlled. His hemoglobin A1c is 6.7. We'll start CBG monitoring and gentle sliding scale NovoLog as his diet is progressed.  Mental retardation and depression. Reported history of psychotic disorder. He is treated with Namenda and Celexa. Apparently previous antipsychotic medications were discontinued. He is currently stable and cooperative. Pharmacy recommends decreasing Celexa to 20 mg daily due to potential cardiac adverse effects in patients greater than 70 years old.     Plan. 1. Start clear liquids per recommendation by gastroenterology. 2. Consult general surgery. 3. Continue potassium chloride IV runs. Will start oral potassium chloride by mouth when his diet was progressed. We'll continue potassium chloride at maintenance IV fluids. 4. Continue supportive treatment. 5. Per recommendation of pharmacy, will decrease Celexa to 30 mg for 1-2 week and then down to 20 mg.  Time spent: 35 minutes.    [facility]  [facility] Pager [phone]. If 7PM-7AM, please contact night-coverage at [email or web address], password [email or web address] 12/04/2012, 12:28 PM  LOS: 2 days

## 2012-12-04 NOTE — Op Note (Signed)
[facility] [address] [place], [address]   COLONOSCOPY PROCEDURE REPORT  PATIENT: Xavier Tanner, Xavier Tanner  MR#:         [id number] BIRTHDATE: [birth year] , 62  yrs. old GENDER: Male ENDOSCOPIST: R.  Roetta Sessions, MD FACP FACG REFERRED BY:  Syliva Overman, M.D. PROCEDURE DATE:  12/04/2012 PROCEDURE:     Colonoscopy withbiopsy and lesion tattoo  INDICATIONS: Microcytic anemia; Hemoccult positive stool; no prior colonoscopy  INFORMED CONSENT:  The risks, benefits, alternatives and imponderables including but not limited to bleeding, perforation as well as the possibility of a missed lesion have been reviewed.  The potential for biopsy, lesion removal, etc. have also been discussed.  Questions have been answered.  All parties agreeable. Please see the history and physical in the medical record for more information.  MEDICATIONS: Versed 2 mg IV and Demerol 50 mg IV. Zofran 4 mg IV. Cetacaine spray  DESCRIPTION OF PROCEDURE:  After a digital rectal exam was performed, the EC-[id number] ([id number])  colonoscope was advanced from the anus through the rectum and colon to the area of the cecum, ileocecal valve and appendiceal orifice.  The cecum was deeply intubated.  These structures were well-seen and photographed for the record.  From the level of the cecum and ileocecal valve, the scope was slowly and cautiously withdrawn.  The mucosal surfaces were carefully surveyed utilizing scope tip deflection to facilitate fold flattening as needed.  The scope was pulled down into the rectum where a thorough examination including retroflexion was performed.    FINDINGS:  Adequate preparation. Normal rectum. Long redundant, capacious colon. Patient had a 3-4 cm x 3-4 cm semilunar, ulcerated, neoplastic-appearing process in the ascending colon approximately 10 cm distal to the ileocecal valve. The remainder of the colonic mucosa appeared normal. A number of maneuvers had to  be employed including changing of the patient's position and external abdominal pressure to reach the cecum.  THERAPEUTIC / DIAGNOSTIC MANEUVERS PERFORMED:  The above-mentioned lesion was biopsied multiple times the jumbo biopsy forceps. Subsequently, this lesion was tattooed 2 cm above the proximal extent of the lesion and  2 cm below the distal extent of the lesion.  COMPLICATIONS:  CECAL WITHDRAWAL TIME:  IMPRESSION:    Colorectal carcinoma-status post biopsy and tattooing. EGD not done  RECOMMENDATIONS: Clear liquid diet. Followup on pathology. Surgery consultation. The operator has paged the hospitalist to discuss the case.  _______________________________ eSigned:  R. Roetta Sessions, MD FACP [facility] 12/04/2012 10:15 AM   CC:

## 2012-12-05 LAB — CBC
Hemoglobin: 8.4 g/dL — ABNORMAL LOW (ref 13.0–17.0)
MCH: 19.9 pg — ABNORMAL LOW (ref 26.0–34.0)
MCV: 64.3 fL — ABNORMAL LOW (ref 78.0–100.0)
Platelets: 240 10*3/uL (ref 150–400)
RBC: 4.23 MIL/uL (ref 4.22–5.81)
WBC: 6.7 10*3/uL (ref 4.0–10.5)

## 2012-12-05 LAB — BASIC METABOLIC PANEL
CO2: 27 mEq/L (ref 19–32)
Calcium: 8.5 mg/dL (ref 8.4–10.5)
Chloride: 105 mEq/L (ref 96–112)
Glucose, Bld: 94 mg/dL (ref 70–99)
Potassium: 3.2 mEq/L — ABNORMAL LOW (ref 3.5–5.1)
Sodium: 138 mEq/L (ref 135–145)

## 2012-12-05 LAB — GLUCOSE, CAPILLARY: Glucose-Capillary: 105 mg/dL — ABNORMAL HIGH (ref 70–99)

## 2012-12-05 MED ORDER — INSULIN ASPART 100 UNIT/ML ~~LOC~~ SOLN: 0.0000 [IU] | Freq: Three times a day (TID) | SUBCUTANEOUS | Status: DC

## 2012-12-05 MED ORDER — POTASSIUM CHLORIDE CRYS ER 20 MEQ PO TBCR
20.0000 meq | EXTENDED_RELEASE_TABLET | Freq: Two times a day (BID) | ORAL | Status: DC
  Administered 2012-12-05 – 2012-12-06 (×3): 20 meq via ORAL
  Filled 2012-12-05 (×3): qty 1

## 2012-12-05 NOTE — Progress Notes (Signed)
[facility] PROGRESS NOTE  Xavier Tanner ZOX:[id number] DOB: 05/17/1950 DOA: 12/02/2012 PCP: Syliva Overman, MD   Code Status:  Full code Family Communication: None available; will inform DSS guardian of diagnoses on Monday Disposition Plan: To be determined   Consultants:  Gastroenterology  General surgery pending.  Procedures:  Colonoscopy with biopsy on 12/04/2012, Dr. Katha Cabal cm x 3-4 cm semilunar, ulcerated, neoplastic-appearing process in the ascending colon; the remainder of the colonic mucosa appeared normal. Status post biopsy of lesion.  Antibiotics:  None  HPI/Subjective:  The patient is a 62 year old man with a history of mental retardation, psychotic disorder, and hypertension, who was admitted on 12/02/2012 for severe anemia per laboratory studies in the outpatient setting. His hemoglobin was 6.5 on admission. Colonoscopy on 12/04/2012 revealed an ascending colon ulcerated mass, consistent with malignancy.  The patient is sitting up in bed. He has no complaints.  Objective: Filed Vitals:   12/05/12 0611  BP: 122/66  Pulse: 72  Temp: 98.2 F (36.8 C)  Resp: 17    Intake/Output Summary (Last 24 hours) at 12/05/12 0901 Last data filed at 12/05/12 0849  Gross per 24 hour  Intake 2833.75 ml  Output      0 ml  Net 2833.75 ml   Filed Weights   12/02/12 2211 12/03/12 0130  Weight: 75.892 kg (167 lb 5 oz) 75.3 kg (166 lb 0.1 oz)    Exam:   General:  Mildly obese 62 year old Caucasian man laying in bed, in no acute distress.  Cardiovascular: S1, S2, with a soft systolic murmur.  Respiratory: Decreased breath sounds in the bases, otherwise clear.  Abdomen: Positive bowel sounds, obese, nontender, nondistended.  Musculoskeletal: No acute hot red joints.  Extremities: No pedal edema.  Neurologic: He is alert and oriented to himself and hospital. Cranial nerves II through XII are grossly intact with exception of decrease in visual  acuity..  Data Reviewed: Basic Metabolic Panel:  Recent Labs Lab 12/02/12 2305 12/03/12 0852 12/04/12 0633 12/05/12 0627  NA 136 137 141 138  K 3.4* 3.4* 2.8* 3.2*  CL 100 103 107 105  CO2 25 24 27 27   GLUCOSE 116* 101* 89 94  BUN 17 14 7  5*  CREATININE 1.07 0.84 0.80 0.80  CALCIUM 8.8 8.6 8.3* 8.5  MG  --   --  2.2  --    Liver Function Tests:  Recent Labs Lab 12/03/12 0852  AST 22  ALT 10  ALKPHOS 54  BILITOT 2.7*  PROT 7.2  ALBUMIN 3.4*   No results found for this basename: LIPASE, AMYLASE,  in the last 168 hours No results found for this basename: AMMONIA,  in the last 168 hours CBC:  Recent Labs Lab 12/02/12 1015 12/02/12 2245 12/03/12 0852 12/04/12 0633 12/05/12 0627  WBC 5.7 6.2 5.8 5.7 6.7  NEUTROABS 3.7 4.0  --   --   --   HGB 7.1* 6.5* 8.7* 8.1* 8.4*  HCT 24.2* 21.9* 28.0* 26.1* 27.2*  MCV 56.9* 59.0* 64.2* 63.8* 64.3*  PLT 363 262 263 255 240   Cardiac Enzymes: No results found for this basename: CKTOTAL, CKMB, CKMBINDEX, TROPONINI,  in the last 168 hours BNP (last 3 results) No results found for this basename: PROBNP,  in the last 8760 hours CBG: No results found for this basename: GLUCAP,  in the last 168 hours  No results found for this or any previous visit (from the past 240 hour(s)).   Studies: No results found.  Scheduled Meds: .  citalopram  30 mg Oral Daily  . darifenacin  15 mg Oral Daily  . memantine  10 mg Oral BID  . pantoprazole  40 mg Oral Daily  . potassium chloride  20 mEq Oral BID  . simvastatin  20 mg Oral q1800  . tamsulosin  0.4 mg Oral Daily   Continuous Infusions: . dextrose 5 % and 0.9 % NaCl with KCl 20 mEq/L 30 mL/hr at 12/04/12 1250    Assessment:  Principal Problem:   Colon cancer, ascending Active Problems:   Microcytic anemia   Hypokalemia   HYPERTENSION   Mental retardation   Diabetes mellitus type 2 in obese   Depression   Anemia due to chronic blood loss   1. Ascending colon cancer.  Per colonoscopy. Biopsies taken and results are pending. General surgery will be consulted. Appreciate gastroenterology's assistance.  Iron deficiency microcytic anemia. Secondary to chronic GI blood loss from colon cancer. Anemia panel noted for a ferritin of 4. Status post 2 units of packed red blood cells. His hemoglobin was 6.5 on admission. It has improved appropriately.  Hypokalemia. Possibly secondary to hydrochlorothiazide and IV fluids. His magnesium level was within normal limits. Hydrochlorothiazide was discontinued. Status post IV potassium chloride runs. Will start oral potassium supplementation.  Hypertension. Controlled off of hydrochlorothiazide.   Type 2 diabetes mellitus. Apparently diet controlled. His hemoglobin A1c is 6.7. We'll start CBG monitoring and cover elevations with sliding scale NovoLog.  Mental retardation and depression. Reported history of psychotic disorder. He is treated with Namenda and Celexa. Apparently previous antipsychotic medications were discontinued. He is currently stable and cooperative. Pharmacy recommended decreasing Celexa to 20 mg daily due to potential cardiac adverse effects in patients greater than 59 years old. Celexa decreased from 40 mg to 30 mg. Further tapering should be over the next couple weeks in the outpatient setting.     Plan. 1. Will advance diet to full liquids pending general surgery consultation. 2. Consult general surgery. 3. CBG monitoring and sliding-scale NovoLog. 4. Potassium chloride supplementation orally. 5. Out of bed to the chair. PT evaluation.    Time spent: 25 minutes.    [facility]  [facility] Pager [phone]. If 7PM-7AM, please contact night-coverage at [email or web address], password [facility] 12/05/2012, 9:01 AM  LOS: 3 days

## 2012-12-05 NOTE — Plan of Care (Signed)
Problem: Phase II Progression Outcomes Goal: Progress activity as tolerated unless otherwise ordered Outcome: Completed/Met Date Met:  12/05/12 oob to chair ambulates to br with assist for safety Goal: Other Phase II Outcomes/Goals Outcome: Progressing Consult for general surgery

## 2012-12-06 ENCOUNTER — Other Ambulatory Visit: Payer: Self-pay | Admitting: Family Medicine

## 2012-12-06 ENCOUNTER — Encounter (HOSPITAL_COMMUNITY): Payer: Self-pay | Admitting: Internal Medicine

## 2012-12-06 DIAGNOSIS — C189 Malignant neoplasm of colon, unspecified: Secondary | ICD-10-CM

## 2012-12-06 LAB — CBC
MCH: 19.5 pg — ABNORMAL LOW (ref 26.0–34.0)
MCV: 64.8 fL — ABNORMAL LOW (ref 78.0–100.0)
Platelets: 278 10*3/uL (ref 150–400)
RBC: 4.37 MIL/uL (ref 4.22–5.81)

## 2012-12-06 LAB — BASIC METABOLIC PANEL
CO2: 27 mEq/L (ref 19–32)
Calcium: 8.9 mg/dL (ref 8.4–10.5)
Creatinine, Ser: 0.79 mg/dL (ref 0.50–1.35)
Glucose, Bld: 97 mg/dL (ref 70–99)

## 2012-12-06 LAB — GLUCOSE, CAPILLARY
Glucose-Capillary: 86 mg/dL (ref 70–99)
Glucose-Capillary: 90 mg/dL (ref 70–99)

## 2012-12-06 MED ORDER — CITALOPRAM HYDROBROMIDE 10 MG PO TABS: ORAL_TABLET | ORAL | Status: DC

## 2012-12-06 MED ORDER — HYDROCHLOROTHIAZIDE 12.5 MG PO CAPS: 12.5000 mg | ORAL_CAPSULE | Freq: Every day | ORAL | Status: DC

## 2012-12-06 NOTE — Evaluation (Signed)
Physical Therapy Evaluation Patient Details Name: Xavier Tanner MRN: [id number] DOB: 12-01-1950 Today's Date: 12/06/2012 Time: 1308-6578 PT Time Calculation (min): 19 min  PT Assessment / Plan / Recommendation History of Present Illness  Pt is admitted with a GI bleed and found to have a mass in his colon, suspected to  be CA.  He is a resident in a group home and is mentally challenged.  Clinical Impression   Pt is seen for evaluation.  He is alert and very cooperative, demonstrates no weakness or instability with gait.  I do not anticipate any difficulty for him to transition back to his group home.    PT Assessment  Patent does not need any further PT services    Follow Up Recommendations  No PT follow up    Does the patient have the potential to tolerate intense rehabilitation      Barriers to Discharge        Equipment Recommendations  None recommended by PT    Recommendations for Other Services     Frequency      Precautions / Restrictions Precautions Precautions: None Restrictions Weight Bearing Restrictions: No   Pertinent Vitals/Pain       Mobility  Bed Mobility Bed Mobility: Not assessed Transfers Transfers: Sit to Stand;Stand to Sit Sit to Stand: 7: Independent;From chair/3-in-1 Stand to Sit: 7: Independent;To chair/3-in-1 Ambulation/Gait Ambulation/Gait Assistance: 6: Modified independent (Device/Increase time) Ambulation Distance (Feet): 275 Feet Assistive device: None Gait Pattern: Within Functional Limits Gait velocity: WNL Stairs: No    Exercises     PT Diagnosis:    PT Problem List:   PT Treatment Interventions:       PT Goals(Current goals can be found in the care plan section) Acute Rehab PT Goals PT Goal Formulation: No goals set, d/c therapy  Visit Information  Last PT Received On: 12/06/12 History of Present Illness: Pt is admitted with a GI bleed and found to have a mass in his colon, suspected to  be CA.  He is a resident  in a group home and is mentally challenged.       Prior Functioning  Home Living Family/patient expects to be discharged to:: Group home Prior Function Level of Independence: Independent Comments: he is independent with mobility.Marland KitchenMarland KitchenI assume he is independent with ADLs Communication Communication: HOH    Cognition  Cognition Arousal/Alertness: Awake/alert Behavior During Therapy: WFL for tasks assessed/performed Overall Cognitive Status: Within Functional Limits for tasks assessed    Extremity/Trunk Assessment Lower Extremity Assessment Lower Extremity Assessment: Overall WFL for tasks assessed   Balance Balance Balance Assessed: No (WNL by functional observation)  End of Session PT - End of Session Equipment Utilized During Treatment: Gait belt Activity Tolerance: Patient tolerated treatment well Patient left: in chair;with call bell/phone within reach;with chair alarm set Nurse Communication: Mobility status  GP     Myrlene Broker L 12/06/2012, 10:03 AM

## 2012-12-06 NOTE — Progress Notes (Signed)
Spoke with DSS guardian, Efraim Kaufmann, and staff at ALF- appointment has been made for tomorrow with Dr Suzette Battiest and they will be there also. ALF to pick patient up this pm for return to ALF care as pta.  Reece Levy, MSW [phone]

## 2012-12-06 NOTE — Discharge Summary (Signed)
Physician Discharge Summary  Xavier Tanner EAV:[id number] DOB: 08-Aug-1950 DOA: 12/02/2012  PCP: Syliva Overman, MD  Admit date: 12/02/2012 Discharge date: 12/06/2012  Time spent: Greater than 30 minutes  Recommendations for Outpatient Follow-up:  1. The patient will followup with general surgeon Dr. Leticia Penna on 12/07/2012. Pathology report from biopsy of ulcerated lesion in descending colon pending. 2. Celexa is being titrated down from 40 mg, to 30 mg, to 20 mg daily per the recommendations by pharmacy due to the patient's age of greater than 62 years old. 3. Recommend checking patient's capillary blood glucose 2-3 times a week.  Discharge Diagnoses:  1. 3-4 cm x 3-4 cm semilunar ulcerated, neoplastic-appearing process /mass in the ascending colon, per colonoscopy on 12/04/2012. This is a colon cancer until proven otherwise. Pathology results pending. 2. Chronic GI bleeding secondary to colon cancer. 3. Severe microcytic anemia secondary to chronic GI bleeding and colon cancer. The patient's hemoglobin was 6.5 on admission. Status post 2 units of packed red blood cell transfusions. His hemoglobin was 8.5 at the time of discharge. 4. Hypertension. 5. Hypokalemia secondary to hydrochlorothiazide. 6. Type 2 diabetes mellitus, diet controlled. 7. Obesity. 8. Depression. 10. Mental retardation.  Discharge Condition: Improved.  Diet recommendation: Soft, low fat, carbohydrate modified.  Filed Weights   12/02/12 2211 12/03/12 0130  Weight: 75.892 kg (167 lb 5 oz) 75.3 kg (166 lb 0.1 oz)    History of present illness:  The patient is a 62 year old man with a history significant for type 2 diabetes mellitus-diet controlled, hypertension, mental retardation, and depression, who presented to the emergency department on 12/03/2012 for an abnormal outpatient laboratory result that revealed severe anemia. In the emergency department, he was afebrile and hemodynamically stable. His stool was guaiac  positive. His lab data were significant for a hemoglobin of 6.5, hematocrit of 21.9, MCV of 59, potassium of 3.4, glucose of 116. He was admitted for further evaluation and management.  Hospital Course:   1. Severe anemia and colon mass/malignancy. The patient was started on IV fluids for hydration. He was typed, crossed, and transfuse 2 units of packed red blood cells. His hemoglobin and hematocrit were monitored closely. Gastroenterologist, Dr.  Jena Gauss was consulted. He evaluated the patient further with a colonoscopy although a tentative EGD was planned. The results were significant for a 3-4 cm x 3-4 cm ulcerated neoplastic-appearing process in the ascending colon. The remainder of the colon mucosa was normal. Biopsies were taken of the ulcerated mass. The pathology report was pending at the time of discharge. Per my conversation with Dr. Jena Gauss, it was believed that this was a colon cancer until proven otherwise. General surgeon, Dr. Leticia Penna was consulted. Given the patient's hemodynamic stability and improvement in his hemoglobin, we both agreed that the patient could be discharged home, but with close followup. Therefore, arrangements were made for the patient to followup with Dr. Leticia Penna tomorrow. The patient's guardian was notified. At the time of discharge, the patient's hemoglobin remained stable at 8.5. EGD was not performed. He was still maintained on omeprazole.  Anemia panel revealed a total iron of 231, TIBC 387, ferritin of 4, folate of greater than 20, and vitamin B12 of 413.  2. Hypokalemia. He was repleted with potassium chloride in the IV fluids, with IV lines, and eventually orally. His hypokalemia was secondary to hydrochlorothiazide and previous IV fluids. His serum potassium was 4.0 at the time of discharge.  3. Hypertension. Hydrochlorothiazide was withheld secondary to low-normal blood pressures. It was resumed  at 12.5 mg daily rather than 25 mg daily.  4. Depression/mental  retardation. The patient was maintained on Celexa and Namenda. The pharmacy recommended decreasing the dose of Celexa from 40 mg to 20 mg secondary to age and increased risk of cardiac abnormalities. The dictating physician preferred titrating the patient down to 40 mg rather than abruptly decreasing it by 20 mg. Therefore, the patient was discharged on 30 mg daily for 2 weeks and then 20 mg daily thereafter. Further management will be deferred to Dr. Lodema Hong.  5. Type 2 diabetes mellitus. His hemoglobin A1c was 6.7. He was treated with sliding scale NovoLog during hospitalization. His blood glucose was reasonably well controlled.   Procedures: Colonoscopy:FINDINGS: Adequate preparation. Normal rectum. Long redundant,  capacious colon. Patient had a 3-4 cm x 3-4 cm semilunar,  ulcerated, neoplastic-appearing process in the ascending colon  approximately 10 cm distal to the ileocecal valve. The remainder of  the colonic mucosa appeared normal. A number of maneuvers had to be  employed including changing of the patient's position and external  abdominal pressure to reach the cecum.  THERAPEUTIC / DIAGNOSTIC MANEUVERS PERFORMED: The above-mentioned  lesion was biopsied multiple times the jumbo biopsy forceps.  Subsequently, this lesion was tattooed 2 cm above the proximal  extent of the lesion and 2 cm below the distal extent of the  lesion.    Consultations:  Gastroenterologist, Roetta Sessions, M.D.  General surgeon, Tilford Pillar, M.D.  Discharge Exam: Filed Vitals:   12/06/12 0626  BP: 137/74  Pulse: 68  Temp: 97.8 F (36.6 C)  Resp: 15    General: Pleasant African American man sitting up in a chair, in no acute distress. Cardiovascular: S1, S2, with a soft systolic murmur. Respiratory: Clear to auscultation bilaterally.  Discharge Instructions      Discharge Orders   Future Appointments Provider Department Dept Phone   12/21/2012 3:00 PM Kerri Perches, MD [facility]  Primary Care [phone]   01/06/2013 10:00 AM Kerri Perches, MD [facility] [phone]   Patient should bring all necessary paperwork to be completed.  Arrive 15 minutes prior to the appointment.   Future Orders Complete By Expires   Discharge instructions  As directed    Comments:     Diet should include soft low fat foods.   Increase activity slowly  As directed        Medication List    STOP taking these medications       aspirin 81 MG tablet     hydrochlorothiazide 25 MG tablet  Commonly known as:  HYDRODIURIL  Replaced by:  hydrochlorothiazide 12.5 MG capsule     potassium chloride 10 MEQ tablet  Commonly known as:  K-DUR     Vitamin D (Ergocalciferol) 50000 UNITS Caps capsule  Commonly known as:  DRISDOL      TAKE these medications       citalopram 10 MG tablet  Commonly known as:  CELEXA  Give three 10 mg tablets daily (30 mg) for 2 weeks. Then, give 2 tablets daily thereafter for a total of 20 mg daily.     fluticasone 50 MCG/ACT nasal spray  Commonly known as:  FLONASE  Place 1 spray into the nose daily.     hydrochlorothiazide 12.5 MG capsule  Commonly known as:  MICROZIDE  Take 1 capsule (12.5 mg total) by mouth daily.     hydrocortisone cream 1 %  Apply 1 application topically 3 (three) times daily. Apply to affected  area     magnesium hydroxide 400 MG/5ML suspension  Commonly known as:  MILK OF MAGNESIA  Take 30 mLs by mouth 3 (three) times daily as needed for constipation (If no bowel movement after 3 days may repeat x3 doses).     memantine 10 MG tablet  Commonly known as:  NAMENDA  Take 10 mg by mouth 2 (two) times daily.     omeprazole 20 MG capsule  Commonly known as:  PRILOSEC  TAKE 1 CAPSULE BY MOUTH ONCE DAILY. **DO NOT CRUSH**     pravastatin 40 MG tablet  Commonly known as:  PRAVACHOL  TAKE 1 TABLET BY MOUTH AT BEDTIME.     solifenacin 5 MG tablet  Commonly known as:  VESICARE  Take 5 mg by mouth daily.      tamsulosin 0.4 MG Caps capsule  Commonly known as:  FLOMAX  TAKE 1 CAPSULE BY MOUTH ONCE DAILY. **DO NOT CRUSH**       Allergies  Allergen Reactions  . Ace Inhibitors     REACTION: cough   Follow-up Information   Follow up with Syliva Overman, MD On 12/21/2012. (at 3:00 pm)    Specialty:  Family Medicine   Contact information:   [address] [place] [address] [phone]       Follow up with Fabio Bering, MD.   Specialty:  General Surgery   Contact information:   [address] [place] [address] [phone]        The results of significant diagnostics from this hospitalization (including imaging, microbiology, ancillary and laboratory) are listed below for reference.    Significant Diagnostic Studies: No results found.  Microbiology: No results found for this or any previous visit (from the past 240 hour(s)).   Labs: Basic Metabolic Panel:  Recent Labs Lab 12/02/12 2305 12/03/12 0852 12/04/12 0633 12/05/12 0627 12/06/12 0604  NA 136 137 141 138 141  K 3.4* 3.4* 2.8* 3.2* 4.0  CL 100 103 107 105 108  CO2 25 24 27 27 27   GLUCOSE 116* 101* 89 94 97  BUN 17 14 7  5* 4*  CREATININE 1.07 0.84 0.80 0.80 0.79  CALCIUM 8.8 8.6 8.3* 8.5 8.9  MG  --   --  2.2  --   --    Liver Function Tests:  Recent Labs Lab 12/03/12 0852  AST 22  ALT 10  ALKPHOS 54  BILITOT 2.7*  PROT 7.2  ALBUMIN 3.4*   No results found for this basename: LIPASE, AMYLASE,  in the last 168 hours No results found for this basename: AMMONIA,  in the last 168 hours CBC:  Recent Labs Lab 12/02/12 1015 12/02/12 2245 12/03/12 0852 12/04/12 0633 12/05/12 0627 12/06/12 0604  WBC 5.7 6.2 5.8 5.7 6.7 5.8  NEUTROABS 3.7 4.0  --   --   --   --   HGB 7.1* 6.5* 8.7* 8.1* 8.4* 8.5*  HCT 24.2* 21.9* 28.0* 26.1* 27.2* 28.3*  MCV 56.9* 59.0* 64.2* 63.8* 64.3* 64.8*  PLT 363 262 263 255 240 278   Cardiac Enzymes: No results found for this basename:  CKTOTAL, CKMB, CKMBINDEX, TROPONINI,  in the last 168 hours BNP: BNP (last 3 results) No results found for this basename: PROBNP,  in the last 8760 hours CBG:  Recent Labs Lab 12/05/12 1118 12/05/12 1623 12/05/12 2049 12/06/12 0717 12/06/12 1144  GLUCAP 85 110* 105* 93 86       Signed:  Torra Pala  [facility]  12/06/2012, 2:20 PM

## 2012-12-06 NOTE — Progress Notes (Signed)
Spoke with DSS who is patient's legal guardianMaximiano Tanner  # [phone] and cell [phone]. After hours/emergency [phone] - business hours. Updated her per MD's request of patient's current condition and diagnosis of colon cancer- Felissa shared with me that patient's mother is in a local SNF and he visits her regularly- CSW will keep DSS updated and follow to further assist with disposition and support. Reece Levy, MSW [phone]

## 2012-12-06 NOTE — Progress Notes (Signed)
Pt discharged back to [facility] today per Dr. Sherrie Mustache. Pt's IV site D/C'd and WNL. Pt's VS stable at this time. Caregiver provided with home medication list and discharge instructions. Verbalized understanding. Pt left floor via WC in stable condition accompanied by RN.

## 2012-12-09 ENCOUNTER — Other Ambulatory Visit: Payer: Self-pay

## 2012-12-09 ENCOUNTER — Encounter (HOSPITAL_COMMUNITY)
Admission: RE | Admit: 2012-12-09 | Discharge: 2012-12-09 | Disposition: A | Discharge status: Home / Self Care | Payer: Medicare Other | Source: Ambulatory Visit | Attending: General Surgery | Admitting: General Surgery

## 2012-12-09 ENCOUNTER — Encounter (HOSPITAL_COMMUNITY): Payer: Self-pay

## 2012-12-09 ENCOUNTER — Encounter (HOSPITAL_COMMUNITY): Payer: Self-pay | Admitting: Pharmacy Technician

## 2012-12-09 DIAGNOSIS — Z01810 Encounter for preprocedural cardiovascular examination: Secondary | ICD-10-CM | POA: Insufficient documentation

## 2012-12-09 DIAGNOSIS — Z01812 Encounter for preprocedural laboratory examination: Secondary | ICD-10-CM | POA: Insufficient documentation

## 2012-12-09 HISTORY — DX: Unspecified hearing loss, unspecified ear: H91.90

## 2012-12-09 LAB — CBC WITH DIFFERENTIAL/PLATELET
Basophils Relative: 0 % (ref 0–1)
Eosinophils Absolute: 0 10*3/uL (ref 0.0–0.7)
MCH: 19.4 pg — ABNORMAL LOW (ref 26.0–34.0)
MCHC: 30.4 g/dL (ref 30.0–36.0)
Neutrophils Relative %: 65 % (ref 43–77)
Platelets: 269 10*3/uL (ref 150–400)

## 2012-12-09 LAB — COMPREHENSIVE METABOLIC PANEL
ALT: 12 U/L (ref 0–53)
Albumin: 3.7 g/dL (ref 3.5–5.2)
Alkaline Phosphatase: 63 U/L (ref 39–117)
BUN: 13 mg/dL (ref 6–23)
Calcium: 9.1 mg/dL (ref 8.4–10.5)
Potassium: 3.5 mEq/L (ref 3.5–5.1)
Sodium: 134 mEq/L — ABNORMAL LOW (ref 135–145)
Total Protein: 7.5 g/dL (ref 6.0–8.3)

## 2012-12-09 NOTE — Patient Instructions (Addendum)
Xavier Tanner  12/09/2012   Your procedure is scheduled on:  12/15/2012  Report to [facility] at  730  AM.  Call this number if you have problems the morning of surgery: [phone]   Remember:   Do not eat food or drink liquids after midnight.   Take these medicines the morning of surgery with A SIP OF WATER: celexa, microzide, namenda, prilosec, flomax   Do not wear jewelry, make-up or nail polish.  Do not wear lotions, powders, or perfumes.   Do not shave 48 hours prior to surgery. Men may shave face and neck.  Do not bring valuables to the hospital.  [facility] is not responsible for any belongings or valuables.  Contacts, dentures or bridgework may not be worn into surgery.  Leave suitcase in the car. After surgery it may be brought to your room.  For patients admitted to the hospital, checkout time is 11:00 AM the day of discharge.   Patients discharged the day of surgery will not be allowed to drive home.  Name and phone number of your driver: family  Special Instructions: Shower using CHG 2 nights before surgery and the night before surgery.  If you shower the day of surgery use CHG.  Use special wash - you have one bottle of CHG for all showers.  You should use approximately 1/3 of the bottle for each shower.   Please read over the following fact sheets that you were given: Pain Booklet, Coughing and Deep Breathing, Surgical Site Infection Prevention, Anesthesia Post-op Instructions and Care and Recovery After Surgery Laparoscopic Bowel Resection Laparoscopic bowel resection is used to remove a piece of large or small intestine that may be red, sore, or swollen (inflamed) or to remove a portion of bowel that is blocked. LET YOUR CAREGIVER KNOW ABOUT:   Allergies to food or medicine.  Medicines taken, including vitamins, herbs, eyedrops, over-the-counter medicines, and creams.  Use of steroids (by mouth or creams).  Previous problems with anesthetics or numbing  medicines.  History of bleeding problems or blood clots.  Previous surgery.  Other health problems, including diabetes and kidney problems.  Possibility of pregnancy, if this applies. RISKS AND COMPLICATIONS   Infection.  Bleeding.  Injury to other organs.  Anesthetic side effects.  Leakage from where the bowel is put back together (anastomosis).  Long delay before return of bowel function (ileus). BEFORE THE PROCEDURE   You should be present 60 minutes before your procedure or as directed by your caregiver. PROCEDURE  Laparoscopic means that the procedure is done with a thin, lighted tube (laparoscope). Once you are given medicine that makes you sleep (general anesthetic), your surgeon inflates your abdomen with carbon dioxide gas. The laparoscope is put into your abdomen through a small cut (incision). This allows your surgeon to see into the abdomen. A video camera is attached to the laparoscope to enlarge the view. The surgeon sees this image on a monitor. During the procedure, the portion of bowel to be removed is taken out through one of the incisions. The incision may need to be enlarged if the bowel is too large to be removed through one of the smaller incisions. In this case, a small incision will be made and sometimes the bowel repair is made outside the abdomen. AFTER THE PROCEDURE  If there are no problems, recovery time is brief compared to regular surgery. You will rest in a recovery room until you are stable and doing  well. Following this, if you have no other problems, you will be allowed to return to your room. Recovery time varies depending on what is found during surgery, your age, and your general health. Sometimes, it takes a few days for bowel function to return (passing gas, bowel movements). You will stay in the hospital until bowel function returns. Sometimes, a tube is placed in your stomach, through your nose (nasogastric tube). This tube is used to release  pressure from your stomach (decompress) and to decrease nausea until bowel function returns. Document Released: 09/24/2000 Document Revised: 09/30/2011 Document Reviewed: 08/20/2009 [facility] Patient Information 2014 [place], [place]. PATIENT INSTRUCTIONS POST-ANESTHESIA  IMMEDIATELY FOLLOWING SURGERY:  Do not drive or operate machinery for the first twenty four hours after surgery.  Do not make any important decisions for twenty four hours after surgery or while taking narcotic pain medications or sedatives.  If you develop intractable nausea and vomiting or a severe headache please notify your doctor immediately.  FOLLOW-UP:  Please make an appointment with your surgeon as instructed. You do not need to follow up with anesthesia unless specifically instructed to do so.  WOUND CARE INSTRUCTIONS (if applicable):  Keep a dry clean dressing on the anesthesia/puncture wound site if there is drainage.  Once the wound has quit draining you may leave it open to air.  Generally you should leave the bandage intact for twenty four hours unless there is drainage.  If the epidural site drains for more than 36-48 hours please call the anesthesia department.  QUESTIONS?:  Please feel free to call your physician or the hospital operator if you have any questions, and they will be happy to assist you.

## 2012-12-15 ENCOUNTER — Encounter (HOSPITAL_COMMUNITY): Payer: Self-pay | Admitting: *Deleted

## 2012-12-15 ENCOUNTER — Inpatient Hospital Stay (HOSPITAL_COMMUNITY): Payer: Medicare Other | Admitting: Anesthesiology

## 2012-12-15 ENCOUNTER — Encounter (HOSPITAL_COMMUNITY): Payer: Self-pay | Admitting: Anesthesiology

## 2012-12-15 ENCOUNTER — Inpatient Hospital Stay (HOSPITAL_COMMUNITY)
Admission: RE | Admit: 2012-12-15 | Discharge: 2012-12-23 | DRG: 330 | Disposition: A | Discharge status: Skilled Nursing Facility | Payer: Medicare Other | Source: Ambulatory Visit | Attending: General Surgery | Admitting: General Surgery

## 2012-12-15 ENCOUNTER — Encounter (HOSPITAL_COMMUNITY)
Admission: RE | Disposition: A | Discharge status: Skilled Nursing Facility | Payer: Self-pay | Source: Ambulatory Visit | Attending: General Surgery

## 2012-12-15 DIAGNOSIS — C189 Malignant neoplasm of colon, unspecified: Principal | ICD-10-CM | POA: Diagnosis present

## 2012-12-15 DIAGNOSIS — E876 Hypokalemia: Secondary | ICD-10-CM | POA: Diagnosis not present

## 2012-12-15 DIAGNOSIS — I10 Essential (primary) hypertension: Secondary | ICD-10-CM | POA: Diagnosis present

## 2012-12-15 DIAGNOSIS — D500 Iron deficiency anemia secondary to blood loss (chronic): Secondary | ICD-10-CM | POA: Diagnosis not present

## 2012-12-15 DIAGNOSIS — E7800 Pure hypercholesterolemia, unspecified: Secondary | ICD-10-CM | POA: Diagnosis present

## 2012-12-15 DIAGNOSIS — Z23 Encounter for immunization: Secondary | ICD-10-CM

## 2012-12-15 DIAGNOSIS — D62 Acute posthemorrhagic anemia: Secondary | ICD-10-CM | POA: Diagnosis not present

## 2012-12-15 HISTORY — PX: COLON RESECTION: SHX5231

## 2012-12-15 LAB — CREATININE, SERUM
Creatinine, Ser: 0.68 mg/dL (ref 0.50–1.35)
GFR calc Af Amer: >90 mL/min (ref >90)

## 2012-12-15 LAB — CBC
Hemoglobin: 8.9 g/dL — ABNORMAL LOW (ref 13.0–17.0)
MCH: 19.1 pg — ABNORMAL LOW (ref 26.0–34.0)
Platelets: 297 10*3/uL (ref 150–400)
RBC: 4.66 MIL/uL (ref 4.22–5.81)
WBC: 9.1 10*3/uL (ref 4.0–10.5)

## 2012-12-15 LAB — PREPARE RBC (CROSSMATCH)

## 2012-12-15 SURGERY — COLECTOMY, HAND-ASSISTED, LAPAROSCOPIC
Anesthesia: General | Site: Abdomen | Wound class: Clean Contaminated

## 2012-12-15 MED ORDER — PROPOFOL 10 MG/ML IV BOLUS
INTRAVENOUS | Status: DC | PRN
  Administered 2012-12-15: 150 mg via INTRAVENOUS

## 2012-12-15 MED ORDER — DARIFENACIN HYDROBROMIDE ER 7.5 MG PO TB24
7.5000 mg | ORAL_TABLET | Freq: Every day | ORAL | Status: DC
  Administered 2012-12-16 – 2012-12-23 (×8): 7.5 mg via ORAL
  Filled 2012-12-15 (×9): qty 1

## 2012-12-15 MED ORDER — CITALOPRAM HYDROBROMIDE 20 MG PO TABS
10.0000 mg | ORAL_TABLET | Freq: Every day | ORAL | Status: DC
  Administered 2012-12-16 – 2012-12-23 (×8): 10 mg via ORAL
  Filled 2012-12-15 (×8): qty 1

## 2012-12-15 MED ORDER — LACTATED RINGERS IV SOLN
INTRAVENOUS | Status: DC
  Administered 2012-12-15: 12:00:00 via INTRAVENOUS
  Administered 2012-12-15: 1000 mL via INTRAVENOUS
  Administered 2012-12-15 (×2): via INTRAVENOUS

## 2012-12-15 MED ORDER — FENTANYL CITRATE 0.05 MG/ML IJ SOLN
INTRAMUSCULAR | Status: AC
  Filled 2012-12-15: qty 2

## 2012-12-15 MED ORDER — FENTANYL CITRATE 0.05 MG/ML IJ SOLN
25.0000 ug | INTRAMUSCULAR | Status: DC | PRN
  Administered 2012-12-15: 25 ug via INTRAVENOUS

## 2012-12-15 MED ORDER — ONDANSETRON HCL 4 MG/2ML IJ SOLN
4.0000 mg | Freq: Four times a day (QID) | INTRAMUSCULAR | Status: DC | PRN
  Administered 2012-12-16 – 2012-12-21 (×7): 4 mg via INTRAVENOUS
  Filled 2012-12-15 (×5): qty 2

## 2012-12-15 MED ORDER — DEXAMETHASONE SODIUM PHOSPHATE 4 MG/ML IJ SOLN
4.0000 mg | Freq: Once | INTRAMUSCULAR | Status: AC
  Administered 2012-12-15: 4 mg via INTRAVENOUS

## 2012-12-15 MED ORDER — ENOXAPARIN SODIUM 40 MG/0.4ML ~~LOC~~ SOLN
40.0000 mg | SUBCUTANEOUS | Status: DC
  Administered 2012-12-15 – 2012-12-22 (×8): 40 mg via SUBCUTANEOUS
  Filled 2012-12-15 (×8): qty 0.4

## 2012-12-15 MED ORDER — ENOXAPARIN SODIUM 40 MG/0.4ML ~~LOC~~ SOLN
SUBCUTANEOUS | Status: AC
  Filled 2012-12-15: qty 0.4

## 2012-12-15 MED ORDER — ROCURONIUM BROMIDE 50 MG/5ML IV SOLN
INTRAVENOUS | Status: AC
  Filled 2012-12-15: qty 1

## 2012-12-15 MED ORDER — MIDAZOLAM HCL 2 MG/2ML IJ SOLN
INTRAMUSCULAR | Status: AC
  Filled 2012-12-15: qty 2

## 2012-12-15 MED ORDER — ONDANSETRON HCL 4 MG/2ML IJ SOLN: 4.0000 mg | Freq: Once | INTRAMUSCULAR | Status: DC | PRN

## 2012-12-15 MED ORDER — GLYCOPYRROLATE 0.2 MG/ML IJ SOLN
INTRAMUSCULAR | Status: AC
  Filled 2012-12-15: qty 2

## 2012-12-15 MED ORDER — ONDANSETRON HCL 4 MG/2ML IJ SOLN
4.0000 mg | Freq: Once | INTRAMUSCULAR | Status: AC
  Administered 2012-12-15: 4 mg via INTRAVENOUS

## 2012-12-15 MED ORDER — GLYCOPYRROLATE 0.2 MG/ML IJ SOLN
INTRAMUSCULAR | Status: DC | PRN
  Administered 2012-12-15: .5 mg via INTRAVENOUS

## 2012-12-15 MED ORDER — MORPHINE SULFATE 4 MG/ML IJ SOLN
1.0000 mg | INTRAMUSCULAR | Status: DC | PRN
  Administered 2012-12-16 – 2012-12-17 (×5): 2 mg via INTRAVENOUS
  Administered 2012-12-18 – 2012-12-19 (×2): 4 mg via INTRAVENOUS
  Administered 2012-12-21 – 2012-12-22 (×3): 2 mg via INTRAVENOUS
  Administered 2012-12-23: 4 mg via INTRAVENOUS
  Administered 2012-12-23: 2 mg via INTRAVENOUS
  Filled 2012-12-15 (×12): qty 1

## 2012-12-15 MED ORDER — DEXTROSE 5 % IV SOLN
INTRAVENOUS | Status: AC
  Filled 2012-12-15: qty 2

## 2012-12-15 MED ORDER — CHLORHEXIDINE GLUCONATE 4 % EX LIQD: 1.0000 "application " | Freq: Once | CUTANEOUS | Status: DC

## 2012-12-15 MED ORDER — MIDAZOLAM HCL 2 MG/2ML IJ SOLN
1.0000 mg | INTRAMUSCULAR | Status: DC | PRN
  Administered 2012-12-15 (×2): 2 mg via INTRAVENOUS

## 2012-12-15 MED ORDER — DEXTROSE 5 % IV SOLN
2.0000 g | INTRAVENOUS | Status: AC
  Administered 2012-12-15: 2 g via INTRAVENOUS

## 2012-12-15 MED ORDER — LIDOCAINE HCL (CARDIAC) 20 MG/ML IV SOLN
INTRAVENOUS | Status: DC | PRN
  Administered 2012-12-15: 30 mg via INTRAVENOUS

## 2012-12-15 MED ORDER — FENTANYL CITRATE 0.05 MG/ML IJ SOLN
INTRAMUSCULAR | Status: DC | PRN
  Administered 2012-12-15 (×7): 50 ug via INTRAVENOUS

## 2012-12-15 MED ORDER — TAMSULOSIN HCL 0.4 MG PO CAPS
0.4000 mg | ORAL_CAPSULE | Freq: Every day | ORAL | Status: DC
  Administered 2012-12-16 – 2012-12-23 (×8): 0.4 mg via ORAL
  Filled 2012-12-15 (×8): qty 1

## 2012-12-15 MED ORDER — ROCURONIUM BROMIDE 100 MG/10ML IV SOLN
INTRAVENOUS | Status: DC | PRN
  Administered 2012-12-15: 5 mg via INTRAVENOUS
  Administered 2012-12-15: 35 mg via INTRAVENOUS
  Administered 2012-12-15 (×2): 5 mg via INTRAVENOUS

## 2012-12-15 MED ORDER — NEOSTIGMINE METHYLSULFATE 1 MG/ML IJ SOLN
INTRAMUSCULAR | Status: DC | PRN
  Administered 2012-12-15: 3 mg via INTRAVENOUS

## 2012-12-15 MED ORDER — FENTANYL CITRATE 0.05 MG/ML IJ SOLN
INTRAMUSCULAR | Status: AC
Start: 2012-12-15 — End: 2012-12-15
  Filled 2012-12-15: qty 5

## 2012-12-15 MED ORDER — LACTATED RINGERS IV SOLN
INTRAVENOUS | Status: DC
  Administered 2012-12-16 – 2012-12-18 (×5): via INTRAVENOUS

## 2012-12-15 MED ORDER — SODIUM CHLORIDE 0.9 % IR SOLN
Status: DC | PRN
  Administered 2012-12-15: 2000 mL

## 2012-12-15 MED ORDER — BUPIVACAINE HCL (PF) 0.5 % IJ SOLN
INTRAMUSCULAR | Status: AC
  Filled 2012-12-15: qty 30

## 2012-12-15 MED ORDER — FENTANYL CITRATE 0.05 MG/ML IJ SOLN
INTRAMUSCULAR | Status: AC
Start: 2012-12-15 — End: 2012-12-15
  Filled 2012-12-15: qty 2

## 2012-12-15 MED ORDER — PROPOFOL 10 MG/ML IV EMUL
INTRAVENOUS | Status: AC
  Filled 2012-12-15: qty 20

## 2012-12-15 MED ORDER — DEXAMETHASONE SODIUM PHOSPHATE 4 MG/ML IJ SOLN
INTRAMUSCULAR | Status: AC
  Filled 2012-12-15: qty 1

## 2012-12-15 MED ORDER — LIDOCAINE HCL (PF) 1 % IJ SOLN
INTRAMUSCULAR | Status: AC
  Filled 2012-12-15: qty 5

## 2012-12-15 MED ORDER — MEMANTINE HCL 10 MG PO TABS
10.0000 mg | ORAL_TABLET | Freq: Two times a day (BID) | ORAL | Status: DC
  Administered 2012-12-15 – 2012-12-23 (×16): 10 mg via ORAL
  Filled 2012-12-15 (×16): qty 1

## 2012-12-15 MED ORDER — PANTOPRAZOLE SODIUM 40 MG IV SOLR
40.0000 mg | Freq: Every day | INTRAVENOUS | Status: DC
  Administered 2012-12-15 – 2012-12-18 (×4): 40 mg via INTRAVENOUS
  Filled 2012-12-15 (×4): qty 40

## 2012-12-15 MED ORDER — ONDANSETRON HCL 4 MG/2ML IJ SOLN
INTRAMUSCULAR | Status: AC
  Filled 2012-12-15: qty 2

## 2012-12-15 MED ORDER — BUPIVACAINE HCL (PF) 0.5 % IJ SOLN
INTRAMUSCULAR | Status: DC | PRN
  Administered 2012-12-15: 10 mL

## 2012-12-15 MED ORDER — ENOXAPARIN SODIUM 40 MG/0.4ML ~~LOC~~ SOLN
40.0000 mg | Freq: Once | SUBCUTANEOUS | Status: AC
  Administered 2012-12-15: 40 mg via SUBCUTANEOUS

## 2012-12-15 SURGICAL SUPPLY — 51 items
BAG HAMPER (MISCELLANEOUS) ×2 IMPLANT
BENZOIN TINCTURE PRP APPL 2/3 (GAUZE/BANDAGES/DRESSINGS) ×2 IMPLANT
BLADE HEX COATED 2.75 (ELECTRODE) ×2 IMPLANT
CLOTH BEACON ORANGE TIMEOUT ST (SAFETY) ×2 IMPLANT
COVER LIGHT HANDLE STERIS (MISCELLANEOUS) ×4 IMPLANT
DECANTER SPIKE VIAL GLASS SM (MISCELLANEOUS) ×2 IMPLANT
DEVICE TROCAR PUNCTURE CLOSURE (ENDOMECHANICALS) ×2 IMPLANT
DRAPE WARM FLUID 44X44 (DRAPE) ×2 IMPLANT
DRSG OPSITE POSTOP 4X10 (GAUZE/BANDAGES/DRESSINGS) ×2 IMPLANT
DURAPREP 26ML APPLICATOR (WOUND CARE) ×2 IMPLANT
ELECT REM PT RETURN 9FT ADLT (ELECTROSURGICAL) ×2
ELECTRODE REM PT RTRN 9FT ADLT (ELECTROSURGICAL) ×1 IMPLANT
GLOVE BIOGEL PI IND STRL 7.0 (GLOVE) ×2 IMPLANT
GLOVE BIOGEL PI IND STRL 7.5 (GLOVE) ×3 IMPLANT
GLOVE BIOGEL PI INDICATOR 7.0 (GLOVE) ×2
GLOVE BIOGEL PI INDICATOR 7.5 (GLOVE) ×3
GLOVE ECLIPSE 6.5 STRL STRAW (GLOVE) ×4 IMPLANT
GLOVE ECLIPSE 7.0 STRL STRAW (GLOVE) ×8 IMPLANT
GOWN PREVENTION PLUS LG XLONG (DISPOSABLE) ×14 IMPLANT
INST SET LAPROSCOPIC AP (KITS) ×2 IMPLANT
INST SET MAJOR GENERAL (KITS) ×2 IMPLANT
KIT ROOM TURNOVER APOR (KITS) ×2 IMPLANT
NEEDLE INSUFFLATION 14GA 120MM (NEEDLE) ×2 IMPLANT
NS IRRIG 1000ML POUR BTL (IV SOLUTION) ×2 IMPLANT
PACK LAP CHOLE LZT030E (CUSTOM PROCEDURE TRAY) ×2 IMPLANT
PAD ARMBOARD 7.5X6 YLW CONV (MISCELLANEOUS) ×4 IMPLANT
PENCIL HANDSWITCHING (ELECTRODE) ×2 IMPLANT
RELOAD LINEAR CUT PROX 55 BLUE (ENDOMECHANICALS) ×4 IMPLANT
SEALER TISSUE G2 CVD JAW 35 (ENDOMECHANICALS) ×1 IMPLANT
SEALER TISSUE G2 CVD JAW 45CM (ENDOMECHANICALS) ×1
SET BASIN LINEN APH (SET/KITS/TRAYS/PACK) ×2 IMPLANT
SLEEVE ENDOPATH XCEL 5M (ENDOMECHANICALS) ×2 IMPLANT
SPONGE GAUZE 2X2 8PLY STRL LF (GAUZE/BANDAGES/DRESSINGS) ×6 IMPLANT
SPONGE GAUZE 4X4 12PLY (GAUZE/BANDAGES/DRESSINGS) ×2 IMPLANT
SPONGE LAP 18X18 X RAY DECT (DISPOSABLE) ×2 IMPLANT
STAPLER PROXIMATE 55 BLUE (STAPLE) ×2 IMPLANT
STAPLER VISISTAT 35W (STAPLE) ×2 IMPLANT
SUT PDS AB 0 CTX 60 (SUTURE) IMPLANT
SUT SILK 3 0 SH CR/8 (SUTURE) ×4 IMPLANT
SUT VIC AB 2-0 CT2 27 (SUTURE) ×4 IMPLANT
SYR BULB IRRIGATION 50ML (SYRINGE) ×2 IMPLANT
SYS LAPSCP GELPORT 120MM (MISCELLANEOUS) ×2
SYSTEM LAPSCP GELPORT 120MM (MISCELLANEOUS) ×1 IMPLANT
TAPE CLOTH SURG 4X10 WHT LF (GAUZE/BANDAGES/DRESSINGS) ×2 IMPLANT
TRAY FOLEY CATH 16FR SILVER (SET/KITS/TRAYS/PACK) ×2 IMPLANT
TROCAR ENDO BLADELESS 11MM (ENDOMECHANICALS) ×2 IMPLANT
TROCAR XCEL NON-BLD 5MMX100MML (ENDOMECHANICALS) ×2 IMPLANT
TROCAR XCEL UNIV SLVE 11M 100M (ENDOMECHANICALS) ×2 IMPLANT
TUBING INSUF HEATED (TUBING) ×2 IMPLANT
WARMER LAPAROSCOPE (MISCELLANEOUS) ×2 IMPLANT
YANKAUER SUCT BULB TIP NO VENT (SUCTIONS) ×2 IMPLANT

## 2012-12-15 NOTE — Anesthesia Preprocedure Evaluation (Signed)
Anesthesia Evaluation  Patient identified by MRN, date of birth, ID band Patient awake    Reviewed: Allergy & Precautions, H&P , NPO status , Patient's Chart, lab work & pertinent test results  Airway Mallampati: II TM Distance: >3 FB Neck ROM: Full    Dental  (+) Teeth Intact   Pulmonary neg pulmonary ROS,  breath sounds clear to auscultation        Cardiovascular hypertension, Pt. on medications Rhythm:Regular Rate:Normal     Neuro/Psych PSYCHIATRIC DISORDERS (mental retardation) Depression    GI/Hepatic   Endo/Other  diabetes, Type 2, Oral Hypoglycemic Agents  Renal/GU      Musculoskeletal   Abdominal   Peds  Hematology   Anesthesia Other Findings   Reproductive/Obstetrics                           Anesthesia Physical Anesthesia Plan  ASA: III  Anesthesia Plan: General   Post-op Pain Management:    Induction: Intravenous  Airway Management Planned: Oral ETT  Additional Equipment:   Intra-op Plan:   Post-operative Plan: Extubation in OR  Informed Consent: I have reviewed the patients History and Physical, chart, labs and discussed the procedure including the risks, benefits and alternatives for the proposed anesthesia with the patient or authorized representative who has indicated his/her understanding and acceptance.     Plan Discussed with:   Anesthesia Plan Comments:         Anesthesia Quick Evaluation

## 2012-12-15 NOTE — Transfer of Care (Addendum)
Immediate Anesthesia Transfer of Care Note  Patient: Xavier Tanner  Procedure(s) Performed: Procedure(s): HAND ASSISTED LAPAROSCOPIC PARTIAL COLECTOMY (N/A) COLON RESECTION (N/A)  Patient Location: PACU  Anesthesia Type:General  Level of Consciousness: sedated  Airway & Oxygen Therapy: Patient Spontanous Breathing and Patient connected to face mask oxygen  Post-op Assessment: Report given to PACU RN  Post vital signs: Reviewed and stable  Complications: No apparent anesthesia complications  12/16/12  Alert, VSS.  No apparent anesthesia complications.

## 2012-12-15 NOTE — Progress Notes (Signed)
Lab has notified pre-op  that 2 units of PC are available if needed.

## 2012-12-15 NOTE — Anesthesia Postprocedure Evaluation (Signed)
  Anesthesia Post-op Note  Patient: Xavier Tanner  Procedure(s) Performed: Procedure(s): HAND ASSISTED LAPAROSCOPIC PARTIAL COLECTOMY (N/A) COLON RESECTION (N/A)  Patient Location: PACU  Anesthesia Type:General  Level of Consciousness: sedated  Airway and Oxygen Therapy: Patient Spontanous Breathing and Patient connected to face mask oxygen  Post-op Pain: none  Post-op Assessment: Post-op Vital signs reviewed, Patient's Cardiovascular Status Stable, Respiratory Function Stable, Patent Airway and No signs of Nausea or vomiting  Post-op Vital Signs: Reviewed and stable 136/70, 76,16, sats100, temp, 36.7  Complications: No apparent anesthesia complications

## 2012-12-15 NOTE — Anesthesia Procedure Notes (Signed)
Procedure Name: Intubation Date/Time: 12/15/2012 10:06 AM Performed by: Glynn Octave E Pre-anesthesia Checklist: Patient identified, Patient being monitored, Timeout performed, Emergency Drugs available and Suction available Patient Re-evaluated:Patient Re-evaluated prior to inductionOxygen Delivery Method: Circle System Utilized Preoxygenation: Pre-oxygenation with 100% oxygen Intubation Type: IV induction Ventilation: Mask ventilation without difficulty Laryngoscope Size: Mac and 3 Grade View: Grade I Tube type: Oral Tube size: 7.0 mm Number of attempts: 1 Airway Equipment and Method: stylet and Oral airway Placement Confirmation: ETT inserted through vocal cords under direct vision,  positive ETCO2 and breath sounds checked- equal and bilateral Secured at: 21 cm Tube secured with: Tape Dental Injury: Teeth and Oropharynx as per pre-operative assessment

## 2012-12-15 NOTE — Interval H&P Note (Signed)
History and Physical Interval Note:  12/15/2012 7:54 AM  Xavier Tanner  has presented today for surgery, with the diagnosis of colon cancer  The various methods of treatment have been discussed with the patient and family. After consideration of risks, benefits and other options for treatment, the patient has consented to  Procedure(s): HAND ASSISTED LAPAROSCOPIC PARTIAL COLECTOMY (N/A) as a surgical intervention .  The patient's history has been reviewed, patient examined, no change in status, stable for surgery.  I have reviewed the patient's chart and labs.  Questions were answered to the patient's satisfaction.     [name]

## 2012-12-15 NOTE — Op Note (Signed)
Patient:  Xavier Tanner  DOB:  1950/09/15  MRN:  [id number]   Preop Diagnosis:   Right colon mass suspected colon cancer  Postop Diagnosis:  The same  Procedure:  Laparoscopic hand-assisted right partial colectomy with ileocolonic anastomoses  Surgeon:   Dr. Tilford Pillar  Anes:  General endotracheal, 0.5% Sensorcaine plain for local  Indications:  Patient is a 62 year old male who presented to my office with a recent colonoscopy demonstrating a right-sided colon mass. Due to the appearance of this highly suspected this was a colon cancer. Risks benefits and alternatives of surgical intervention were discussed with the patient's durable power of attorney. Risk including but not limited to risk of leading, infection, anastomotic leak, intraoperative cardiac and pulmonary ventral discussed with patient and family. Consent was obtained.  Procedure note:  Patient is taken to the operating room was positioned supine position on the or table time the general anesthetic is administered. Once patient was asleep she symmetrically intubated a nurse anesthetist. At this point fully catheter is placed in standard sterile fashion by the operative staff. His abdomen is prepped with DuraPrep solution and draped in standard fashion. Time out was performed. Stab incision was created supraumbilically with 11 blade scalpel. A Coker clamp was then utilized to grasp the anterior abdominal fascia and lift this anteriorly. A Veress needle is inserted saline drop test is utilized confirm intraperitoneal placement. At this time pneumoperitoneum was initiated and once sufficient pneumoperitoneum was obtained an 11 mm insert overlap scope line visualization the trocar entering into the peritoneal cavity. At this point the inner cannulas removed the laparoscope was reinserted there is no evidence a trocar peritoneal placement injury. At this time I did inspect the abdominal cavity. The cecum was identified. The tattooed  region of the right colon was easily identified. There were several anterior Donald adhesions from the cecum to the right lower quadrant. I did place a second 11 mm trocar in the suprapubic region under visualization. At this point to convert the umbilical trocar to a hand port in standard fashion. The 11 mm car was then reinserted in the epigastric region under visualization. Additionally a 5 mm trocar was placed in the left lateral abdominal wall for the working port. At this point began the dissection. I freed the cecum off the anterior normal wall using the Enseal bipolar device. This dissection was carried along the peritoneal reflection onto the ascending colon and carried around the hepatic flexure. Dissection was carried up to the transverse colon in the gastrocolic ligament was divided using the Enseal. At this time the colon was well mobilized medially. I then continued proximally and identified the terminal ileum. At this point felt that adequate redundancy of the colon to bring this to the hand port incision.  The pneumoperitoneum was evacuated and the colon was brought to the hand port. I identified the planned area division of the small intestine and created a defect in the mesentery with electrocautery. A 55 GIA stapler was utilized to ligate and divide the  Small intestine. The Enseal was then utilized to ligate the mesocolon and high ligation up to the transverse colon. At this point identified in my place of division and utilized a reload the 55 GIA stapler to perform the ligation of the colon. This point the specimen was free it was placed in the back table sent as a permanent specimen to pathology. Hemostasis is excellent. I did oversew the right colic artery which was visualized. While was not bleeding I  felt due to the size that additional reinforcement with a 3-0 silk was important. At this time I turned my attention to creation of the side-to-side anastomosis  Using a 3-0 silk the small  intestine was pexed in a side-to-side fashion to the transverse colon. A defect was created and both the small bowel as well as the colon with electrocautery and the reload of the GIA 55 stapler was utilized to create the stapled anastomosis. The final luminal defect was reapproximated using interrupted 30 silks in simple or fashion. The tails of several these were left long. A portion of omentum was pexed over the anastomosis with these long tails. The mesenteric defect was then approximated using additional 30 silks. As quite pleased with the appearance of the anastomosis. At this time all staff members changed gloves and gowns and the field was changed to new clean instruments.  At this time attention was turned to closure. I did palpate the remainder of the abdominal cavity did not appreciate any additional nodularity or abnormalities. The liver was noted to be normal. An Endo Close suture passing device was utilized to pass a 2-0 Vicryl suture through both 11 mm process with the sutures in place the pneumoperitoneum was evacuated. Trochars and ports were removed. The hand port fascial defect was reapproximated with 2 separate oh looped PDS sutures which are secured to each other. At this point the Vicryl sutures were secured. The skin edges at all trocar and port sites were reapproximated using skin staples. The skin was washed dried moist dry towel after the local anesthetic was instilled. Sterile dressings are placed. The drapes removed the dressing was secured. The patient was allowed to mild general anesthetic was transferred back in stable condition. At the conclusion of procedure all Eschmann, sponge, needle counts are correct. Patient tolerated procedure extremely well.  Complications:  None apparent  EBL:  Less than 100 mL  Specimen:  Right colon and terminal ileum

## 2012-12-15 NOTE — H&P (Signed)
  NTS SOAP Note  Vital Signs:  Vitals as of: 12/07/2012: Systolic 141: Diastolic 79: Heart Rate 74: Temp 97.97F: Height 32ft 3in: Weight 171Lbs 0 Ounces: BMI 30.29  BMI : 30.29 kg/m2  Subjective: This 62 Years 1 Months old Male presents for of : Mass. Patient was recently admitted to [facility] 4 profound and chronic anemia. During his hospitalization he underwent colonoscopy which demonstrated an ascending colon mass. Biopsies were obtained however it based on appearance it is highly likely this is related to a colon cancer. As patient continued to progress well he was discharged with planned followup with  me for surgical evaluation. No significant change in bowel movements. No nausea. Tolerating diet. No complaints of abdominal pain.  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   Smoking Status: Never smoker reviewed on 12/13/2012 Functional Status reviewed on  mm/dd/yyyy ------------------------------------------------ Bathing: Normal Cooking: Normal Dressing: Normal Driving: Normal Eating: Normal Managing Meds: Normal Oral Care: Normal Shopping: Normal Toileting: Normal Transferring: Normal Walking: Normal Cognitive Status reviewed on mm/dd/yyyy ------------------------------------------------ Attention: Normal Decision Making: Normal Language: Normal Memory: Normal Motor: Normal Perception: Normal Problem Solving: Normal Visual and Spatial: Normal   Family History:Obtained    Family Health History Mother, Healthy;  Father, Healthy;  Other Family Member, Living; Healthy; non-contributory/unknown    Objective Information: General:  Well appearing, well nourished in no distress.   Patient does interact but is limited in communication. Skin:     no rash or prominent lesions Head:Atraumatic; no masses; no abnormalities Eyes:  conjunctiva clear, EOM intact, PERRL Mouth:  Mucous membranes moist, no mucosal lesions. Poor dentition Neck:  Supple without lymphadenopathy.  Heart:  RRR, no murmur Lungs:    CTA bilaterally, no wheezes, rhonchi, rales.  Breathing unlabored. Abdomen:Soft, NT/ND, no HSM, no masses.  Assessment:    Plan: : Mass, colon cancer. Patient's durable power of attorney/guardian was present during the discussion and evaluation. Surgical options were discussed at length. We'll schedule as soon as possible and at their convenience.  Patient Education:Alternative treatments to surgery were discussed with patient (and family).  Risks and benefits  of procedure were fully explained to the patient (and family) who gave informed consent. Patient/family questions were addressed.  Follow-up:Pending Surgery

## 2012-12-15 NOTE — Progress Notes (Signed)
UR chart review completed.  

## 2012-12-16 LAB — PHOSPHORUS: Phosphorus: 2.9 mg/dL (ref 2.3–4.6)

## 2012-12-16 LAB — BASIC METABOLIC PANEL
Calcium: 8.4 mg/dL (ref 8.4–10.5)
GFR calc Af Amer: >90 mL/min (ref >90)
GFR calc non Af Amer: >90 mL/min (ref >90)
Potassium: 4 mEq/L (ref 3.5–5.1)
Sodium: 133 mEq/L — ABNORMAL LOW (ref 135–145)

## 2012-12-16 LAB — CBC
MCHC: 30.2 g/dL (ref 30.0–36.0)
Platelets: 313 10*3/uL (ref 150–400)
RDW: 23.4 % — ABNORMAL HIGH (ref 11.5–15.5)
WBC: 12.8 10*3/uL — ABNORMAL HIGH (ref 4.0–10.5)

## 2012-12-16 LAB — MAGNESIUM: Magnesium: 1.9 mg/dL (ref 1.5–2.5)

## 2012-12-16 NOTE — Clinical Social Work Psychosocial (Signed)
Clinical Social Work Department BRIEF PSYCHOSOCIAL ASSESSMENT 12/16/2012  Patient:  Xavier Tanner, Xavier Tanner     Account Number:  [id number]     Admit date:  12/15/2012  Clinical Social Worker:  Nancie Neas  Date/Time:  12/16/2012 02:30 PM  Referred by:  CSW  Date Referred:  12/16/2012 Referred for  ALF Placement   Other Referral:   Interview type:  Other - See comment Other interview type:   Xavier Tanner- facility    PSYCHOSOCIAL DATA Living Status:  FACILITY Admitted from facility:  OTHER Level of care:  Group Home Primary support name:  Xavier Tanner- facility Primary support relationship to patient:  NONE Degree of support available:   adequate    CURRENT CONCERNS Current Concerns  Post-Acute Placement   Other Concerns:    SOCIAL WORK ASSESSMENT / PLAN CSW attempted to meet with pt at bedside, but pt had covers over head. Spoke with Xavier Tanner at [facility] group home where pt has been a resident since 2010. He has a legal guardian at DSS, Xavier Tanner. Pt has been ward of state for many years due to MR. CSW attempted to reach [facility] and left voicemail requesting return call. Per Xavier Tanner, pt is mostly independent with ADLs, but does require some supervision with dressing, bathing, and toileting. Pt was not receiving home health prior to admission. Xavier Tanner indicates pt's only family is his mother who is at [facility]. Okay to return at d/c.   Assessment/plan status:  Psychosocial Support/Ongoing Assessment of Needs Other assessment/ plan:   Information/referral to community resources:   [facility]  DSS    PATIENT'S/FAMILY'S RESPONSE TO PLAN OF CARE: Pt unable to discuss plan of care at this time. Facility agreeable to return when medically stable. Awaiting return call from [facility] at DSS and CSW will attempt again tomorrow.       Xavier Tanner, [place] [phone]

## 2012-12-16 NOTE — Clinical Social Work Note (Signed)
CSW received call from [facility] at DSS who reports they have been guardian since 2009. She reports he does very well at [facility] and they are pleased with care. Request return there at d/c. CSW will continue to follow.  Derenda Fennel, [place] [phone]

## 2012-12-16 NOTE — Care Management Note (Signed)
    Page 1 of 1   12/16/2012     10:18:13 AM   CARE MANAGEMENT NOTE 12/16/2012  Patient:  Xavier Tanner, Xavier Tanner   Account Number:  [id number]  Date Initiated:  12/16/2012  Documentation initiated by:  Sharrie Rothman  Subjective/Objective Assessment:   Pt admitted from [facility] s/p partial colectomy. Pt will return to facility at discharge.     Action/Plan:   CSW to arrange discharge to facility. CM to follow for any discharge planning needs.   Anticipated DC Date:  12/20/2012   Anticipated DC Plan:  ASSISTED LIVING / REST HOME  In-house referral  Clinical Social Worker      DC Planning Services  CM consult      Choice offered to / List presented to:             Status of service:  Completed, signed off Medicare Important Message given?   (If response is "NO", the following Medicare IM given date fields will be blank) Date Medicare IM given:   Date Additional Medicare IM given:    Discharge Disposition:  ASSISTED LIVING  Per UR Regulation:    If discussed at Long Length of Stay Meetings, dates discussed:    Comments:  12/16/12 1015 Arlyss Queen, RN BSN CM

## 2012-12-16 NOTE — Progress Notes (Signed)
1 Day Post-Op  Subjective: Patient seems to be at baseline.  He is pleasant.  He denies ay pain.  Not "sick"  Objective: Vital signs in last 24 hours: Temp:  [98.3 F (36.8 C)-98.7 F (37.1 C)] 98.4 F (36.9 C) (09/04 2037) Pulse Rate:  [84-95] 95 (09/04 2037) Resp:  [18] 18 (09/04 2037) BP: (133-144)/(74-78) 133/74 mmHg (09/04 2037) SpO2:  [90 %-100 %] 97 % (09/04 2037) Last BM Date: 12/13/12  Intake/Output from previous day: 09/03 0701 - 09/04 0700 In: 4083.3 [P.O.:240; I.V.:3843.3] Out: 2405 [Urine:2330; Blood:75] Intake/Output this shift: Total I/O In: -  Out: 450 [Urine:450]  General appearance: alert, no distress and pleasant GI: soft, expected tenderness.  dressing intact.  Lab Results:   Recent Labs  12/15/12 1448 12/16/12 0445  WBC 9.1 12.8*  HGB 8.9* 8.0*  HCT 29.9* 26.5*  PLT 297 313   BMET  Recent Labs  12/15/12 1448 12/16/12 0445  NA  --  133*  K  --  4.0  CL  --  102  CO2  --  24  GLUCOSE  --  112*  BUN  --  11  CREATININE 0.68 0.66  CALCIUM  --  8.4   PT/INR No results found for this basename: LABPROT, INR,  in the last 72 hours ABG No results found for this basename: PHART, PCO2, PO2, HCO3,  in the last 72 hours  Studies/Results: No results found.  Anti-infectives: Anti-infectives   Start     Dose/Rate Route Frequency Ordered Stop   12/15/12 0759  cefOXitin (MEFOXIN) 2 g in dextrose 5 % 50 mL IVPB     2 g 100 mL/hr over 30 Minutes Intravenous On call to O.R. 12/15/12 0759 12/15/12 1011      Assessment/Plan: s/p Procedure(s): HAND ASSISTED LAPAROSCOPIC PARTIAL COLECTOMY (N/A) COLON RESECTION (N/A) doing well.  Await for bowel funciton increase.  Clears for now.    LOS: 1 day    Doug Bucklin C 12/16/2012

## 2012-12-17 ENCOUNTER — Encounter (HOSPITAL_COMMUNITY): Payer: Self-pay | Admitting: General Surgery

## 2012-12-17 NOTE — Clinical Social Work Note (Signed)
CSW confirmed with [facility] Luczak [facility] at [facility] ok for weekend d/c if stable. Notified RN.  Derenda Fennel, [place] [phone]

## 2012-12-17 NOTE — Progress Notes (Signed)
2 Days Post-Op  Subjective: Comfortable. No complaints of any pain other than some abdominal discomfort. Tolerating diet.  Objective: Vital signs in last 24 hours: Temp:  [97.4 F (36.3 C)-98.4 F (36.9 C)] 98.2 F (36.8 C) (09/05 1033) Pulse Rate:  [88-95] 88 (09/05 1033) Resp:  [17-18] 17 (09/05 1033) BP: (118-150)/(68-79) 148/78 mmHg (09/05 1033) SpO2:  [90 %-100 %] 100 % (09/05 1033) Last BM Date: 12/13/12  Intake/Output from previous day: 09/04 0701 - 09/05 0700 In: 1476.7 [P.O.:360; I.V.:1116.7] Out: 3350 [Urine:3350] Intake/Output this shift:    General appearance: alert and no distress GI: Intermittent bowel sounds, soft, moderate expected postoperative tenderness. Incision is clean dry and intact.  Lab Results:   Recent Labs  12/15/12 1448 12/16/12 0445  WBC 9.1 12.8*  HGB 8.9* 8.0*  HCT 29.9* 26.5*  PLT 297 313   BMET  Recent Labs  12/15/12 1448 12/16/12 0445  NA  --  133*  K  --  4.0  CL  --  102  CO2  --  24  GLUCOSE  --  112*  BUN  --  11  CREATININE 0.68 0.66  CALCIUM  --  8.4   PT/INR No results found for this basename: LABPROT, INR,  in the last 72 hours ABG No results found for this basename: PHART, PCO2, PO2, HCO3,  in the last 72 hours  Studies/Results: No results found.  Anti-infectives: Anti-infectives   Start     Dose/Rate Route Frequency Ordered Stop   12/15/12 0759  cefOXitin (MEFOXIN) 2 g in dextrose 5 % 50 mL IVPB     2 g 100 mL/hr over 30 Minutes Intravenous On call to O.R. 12/15/12 0759 12/15/12 1011      Assessment/Plan: s/p Procedure(s): HAND ASSISTED LAPAROSCOPIC PARTIAL COLECTOMY (N/A) COLON RESECTION (N/A) Increase activity. Continue clears for now. Await for bowel function increase.  LOS: 2 days    Rashi Granier C 12/17/2012

## 2012-12-18 MED ORDER — KCL IN DEXTROSE-NACL 20-5-0.45 MEQ/L-%-% IV SOLN
INTRAVENOUS | Status: DC
  Administered 2012-12-18 – 2012-12-19 (×2): via INTRAVENOUS

## 2012-12-18 NOTE — Progress Notes (Signed)
3 Days Post-Op  Subjective: Had large bowel movement last night. Denies any abdominal pain.  Objective: Vital signs in last 24 hours: Temp:  [97.6 F (36.4 C)-98.2 F (36.8 C)] 98.1 F (36.7 C) (09/06 0500) Pulse Rate:  [88-102] 95 (09/06 0500) Resp:  [17-18] 18 (09/06 0500) BP: (132-148)/(75-83) 137/83 mmHg (09/06 0500) SpO2:  [93 %-100 %] 97 % (09/06 0500) Last BM Date: 12/18/12  Intake/Output from previous day: 09/05 0701 - 09/06 0700 In: 1200 [I.V.:1200] Out: 1 [Emesis/NG output:1] Intake/Output this shift: Total I/O In: 465 [P.O.:465] Out: -   General appearance: alert, cooperative and no distress Resp: clear to auscultation bilaterally Cardio: regular rate and rhythm, S1, S2 normal, no murmur, click, rub or gallop GI: Soft. Dressings dry and intact. Occasional bowel sounds appreciated.  Lab Results:   Recent Labs  12/15/12 1448 12/16/12 0445  WBC 9.1 12.8*  HGB 8.9* 8.0*  HCT 29.9* 26.5*  PLT 297 313   BMET  Recent Labs  12/15/12 1448 12/16/12 0445  NA  --  133*  K  --  4.0  CL  --  102  CO2  --  24  GLUCOSE  --  112*  BUN  --  11  CREATININE 0.68 0.66  CALCIUM  --  8.4   PT/INR No results found for this basename: LABPROT, INR,  in the last 72 hours  Studies/Results: No results found.  Anti-infectives: Anti-infectives   Start     Dose/Rate Route Frequency Ordered Stop   12/15/12 0759  cefOXitin (MEFOXIN) 2 g in dextrose 5 % 50 mL IVPB     2 g 100 mL/hr over 30 Minutes Intravenous On call to O.R. 12/15/12 0759 12/15/12 1011      Assessment/Plan: s/p Procedure(s): HAND ASSISTED LAPAROSCOPIC PARTIAL COLECTOMY COLON RESECTION Impression: Bowel function returning. Stable. Will advance to full liquid diet. Adjust IV fluids. We'll remove Foley.  LOS: 3 days    Xavier Tanner A 12/18/2012

## 2012-12-19 LAB — TYPE AND SCREEN: Antibody Screen: NEGATIVE

## 2012-12-19 LAB — CBC
Hemoglobin: 7.7 g/dL — ABNORMAL LOW (ref 13.0–17.0)
MCH: 18.7 pg — ABNORMAL LOW (ref 26.0–34.0)
Platelets: 349 10*3/uL (ref 150–400)
RBC: 4.12 MIL/uL — ABNORMAL LOW (ref 4.22–5.81)
WBC: 10.6 10*3/uL — ABNORMAL HIGH (ref 4.0–10.5)

## 2012-12-19 LAB — BASIC METABOLIC PANEL
CO2: 23 mEq/L (ref 19–32)
Calcium: 8.5 mg/dL (ref 8.4–10.5)
Potassium: 3.2 mEq/L — ABNORMAL LOW (ref 3.5–5.1)
Sodium: 134 mEq/L — ABNORMAL LOW (ref 135–145)

## 2012-12-19 MED ORDER — MAGNESIUM HYDROXIDE 400 MG/5ML PO SUSP
30.0000 mL | Freq: Every day | ORAL | Status: DC
  Administered 2012-12-19 – 2012-12-23 (×4): 30 mL via ORAL
  Filled 2012-12-19 (×4): qty 30

## 2012-12-19 MED ORDER — PANTOPRAZOLE SODIUM 40 MG PO TBEC
40.0000 mg | DELAYED_RELEASE_TABLET | Freq: Every day | ORAL | Status: DC
  Administered 2012-12-19 – 2012-12-23 (×5): 40 mg via ORAL
  Filled 2012-12-19 (×5): qty 1

## 2012-12-19 MED ORDER — POTASSIUM CHLORIDE 10 MEQ/100ML IV SOLN
10.0000 meq | INTRAVENOUS | Status: AC
  Administered 2012-12-19 (×4): 10 meq via INTRAVENOUS
  Filled 2012-12-19: qty 300
  Filled 2012-12-19: qty 100

## 2012-12-19 NOTE — Progress Notes (Signed)
4 Days Post-Op  Subjective: No complaints of pain. Appears to be tolerating full liquid diet well.  Objective: Vital signs in last 24 hours: Temp:  [97.7 F (36.5 C)-98 F (36.7 C)] 98 F (36.7 C) (09/07 0604) Pulse Rate:  [90-94] 94 (09/07 0604) Resp:  [16-17] 16 (09/07 0604) BP: (124-138)/(70-79) 138/70 mmHg (09/07 0604) SpO2:  [97 %-99 %] 99 % (09/07 0604) Last BM Date: 12/18/12  Intake/Output from previous day: 09/06 0701 - 09/07 0700 In: 2527.5 [P.O.:1025; I.V.:1502.5] Out: 2 [Urine:1; Stool:1] Intake/Output this shift:    General appearance: alert, cooperative and appears stated age Resp: clear to auscultation bilaterally Cardio: regular rate and rhythm, S1, S2 normal, no murmur, click, rub or gallop GI: Soft. Occasional bowel sounds appreciated. Incisions healing well.  Lab Results:   Recent Labs  12/19/12 0801  WBC 10.6*  HGB 7.7*  HCT 25.6*  PLT 349   BMET  Recent Labs  12/19/12 0801  NA 134*  K 3.2*  CL 103  CO2 23  GLUCOSE 140*  BUN 11  CREATININE 0.59  CALCIUM 8.5   PT/INR No results found for this basename: LABPROT, INR,  in the last 72 hours  Studies/Results: No results found.  Anti-infectives: Anti-infectives   Start     Dose/Rate Route Frequency Ordered Stop   12/15/12 0759  cefOXitin (MEFOXIN) 2 g in dextrose 5 % 50 mL IVPB     2 g 100 mL/hr over 30 Minutes Intravenous On call to O.R. 12/15/12 0759 12/15/12 1011      Assessment/Plan: s/p Procedure(s): HAND ASSISTED LAPAROSCOPIC PARTIAL COLECTOMY COLON RESECTION Impression: Continues to progress well. 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 Still has hypokalemia. Hemoglobin appears to be stable. Plan: We'll advance to soft diet. Will treat hypokalemia.  LOS: 4 days    Xavier Tanner A 12/19/2012

## 2012-12-20 LAB — BASIC METABOLIC PANEL
Calcium: 8.9 mg/dL (ref 8.4–10.5)
Creatinine, Ser: 0.68 mg/dL (ref 0.50–1.35)
GFR calc non Af Amer: >90 mL/min (ref >90)
Sodium: 137 mEq/L (ref 135–145)

## 2012-12-20 LAB — CBC
MCH: 18.6 pg — ABNORMAL LOW (ref 26.0–34.0)
Platelets: 379 10*3/uL (ref 150–400)
RBC: 4.04 MIL/uL — ABNORMAL LOW (ref 4.22–5.81)
RDW: 24.2 % — ABNORMAL HIGH (ref 11.5–15.5)
WBC: 8.7 10*3/uL (ref 4.0–10.5)

## 2012-12-20 LAB — HEMOGLOBIN AND HEMATOCRIT, BLOOD: HCT: 30.8 % — ABNORMAL LOW (ref 39.0–52.0)

## 2012-12-20 LAB — PREPARE RBC (CROSSMATCH)

## 2012-12-20 NOTE — Progress Notes (Signed)
D- Pt vomited approximately 200cc at 0230 and was given zofran IV per orders at 0255.  Pt's abdomin remained soft and bowel sounds are active in all 4 quadrants.  At 0615 pt had large loose incontinent BM and vomited approximately 200cc again after being placed back in a upright position once cleaned up.  Pt's abdomin remains soft but is distended and bowel sounds are hypoactive in all 4 quadrants at this time.    A- Dr. Lovell Sheehan notified via phone of the above and new order was received to change pt's diet to full liquid.  New order put in computer to be carried out.  R- Pt in bed and denies nausea after vomiting.  Nursing staff to continue to monitor.

## 2012-12-20 NOTE — Progress Notes (Signed)
5 Days Post-Op  Subjective: No significant complaints. When asked patient does state he had emesis last night. Some abdominal discomfort. Discussion is somewhat limited due to patient's cognitive state.  Objective: Vital signs in last 24 hours: Temp:  [97.9 F (36.6 Tanner)-98.4 F (36.9 Tanner)] 98.3 F (36.8 Tanner) (09/08 0447) Pulse Rate:  [84-95] 90 (09/08 0447) Resp:  [17-18] 18 (09/08 0447) BP: (118-128)/(60-86) 128/86 mmHg (09/08 0447) SpO2:  [99 %] 99 % (09/08 0447) Last BM Date: 12/20/12  Intake/Output from previous day: 09/07 0701 - 09/08 0700 In: 700 [I.V.:700] Out: 3 [Urine:3] Intake/Output this shift:    General appearance: alert, no distress and Pleasant Resp: clear to auscultation bilaterally Cardio: regular rate and rhythm GI: Positive bowel sounds, soft, moderate distention. Incisions are all clean dry and intact. Dressings were all removed. Some abdominal tenderness as expected postoperatively. No peritoneal signs.  Lab Results:   Recent Labs  12/19/12 0801 12/20/12 0450  WBC 10.6* 8.7  HGB 7.7* 7.5*  HCT 25.6* 25.4*  PLT 349 379   BMET  Recent Labs  12/19/12 0801 12/20/12 0450  NA 134* 137  K 3.2* 3.5  CL 103 102  CO2 23 26  GLUCOSE 140* 111*  BUN 11 11  CREATININE 0.59 0.68  CALCIUM 8.5 8.9   PT/INR No results found for this basename: LABPROT, INR,  in the last 72 hours ABG No results found for this basename: PHART, PCO2, PO2, HCO3,  in the last 72 hours  Studies/Results: No results found.  Anti-infectives: Anti-infectives   Start     Dose/Rate Route Frequency Ordered Stop   12/15/12 0759  cefOXitin (MEFOXIN) 2 g in dextrose 5 % 50 mL IVPB     2 g 100 mL/hr over 30 Minutes Intravenous On call to O.R. 12/15/12 0759 12/15/12 1011      Assessment/Plan: s/p Procedure(s): HAND ASSISTED LAPAROSCOPIC PARTIAL COLECTOMY (N/A) COLON RESECTION (N/A) Overall patient appears stable. I'm not sure why patient is demonstrating increased emesis with  active bowel function. He does not appear to be a mechanical obstruction. WBC count has been normal. He does not exhibit any evidence of an infectious process. We'll continue full liquids for now however he continues to have symptomatology we'll recheck urine and blood cultures for possible sources of infection. Also given patient's slow decreasing hemoglobin I do feel patient would benefit from 2 units of packed red blood cells. These will be administered today. His E. Keats blood loss anemia is likely secondary to both surgical blood loss, postoperative fluid rehydration, and patient's chronic medical conditions. I do not feel that he has significant active ongoing bleeding at this time. We'll obviously monitor patient over the next 24 hours. Pending patient's progression and hopeful discharge in the next 24-72 hours  LOS: 5 days    Xavier Tanner 12/20/2012

## 2012-12-21 ENCOUNTER — Ambulatory Visit (INDEPENDENT_AMBULATORY_CARE_PROVIDER_SITE_OTHER): Payer: Medicare Other | Admitting: Family Medicine

## 2012-12-21 DIAGNOSIS — C189 Malignant neoplasm of colon, unspecified: Secondary | ICD-10-CM

## 2012-12-21 LAB — TYPE AND SCREEN
Antibody Screen: NEGATIVE
Unit division: 0

## 2012-12-21 MED ORDER — INFLUENZA VAC SPLIT QUAD 0.5 ML IM SUSP
0.5000 mL | INTRAMUSCULAR | Status: AC
  Administered 2012-12-22: 0.5 mL via INTRAMUSCULAR
  Filled 2012-12-21: qty 0.5

## 2012-12-21 MED ORDER — BOOST / RESOURCE BREEZE PO LIQD
1.0000 | Freq: Three times a day (TID) | ORAL | Status: DC
  Administered 2012-12-21 – 2012-12-23 (×4): 1 via ORAL

## 2012-12-21 NOTE — Progress Notes (Signed)
INITIAL NUTRITION ASSESSMENT  DOCUMENTATION CODES Per approved criteria  -Not Applicable   INTERVENTION: Resource Breeze po TID, each supplement provides 250 kcal and 9 grams of protein.  NUTRITION DIAGNOSIS: Inadequate oral food intake related to decreased appetite as evidenced by PO: 0-50%.   Goal: Pt will meet >90% of estimated calorie and protein needs  Monitor:  Diet advancement, PO intake, labs, weight changes, skin integrity, changes in status  Reason for Assessment: LOS= 6 days, poor po's  62 y.o. male  Admitting Dx: <principal problem not specified>  ASSESSMENT: Pt is a resident of [place] with DSS guardianship.  Pt with MR, so unable to obtain hx. S/p colon resection and lap partial colectomy on 12/15/12. Noted in MD notes that pathology revealed adenocarcinoma.  Intake has been very poor; PO: 0-50%. Noted nausea and upset stomach.  Wt hx reveals 11# (6.1%) wt loss x 1 year, 6# (6.1%) wt loss x 6 months, and 2# (1.2%) wt gain x 1 month. Weight changes not significant.  Pt does not meet criteria for malnutrition at this time, however, is at risk for malnutrition given increased nutrition needs for adenocarcinoma, poor po intake, and hx of wt loss.   Height: Ht Readings from Last 1 Encounters:  12/15/12 5\' 3"  (1.6 m)    Weight: Wt Readings from Last 1 Encounters:  12/15/12 168 lb (76.204 kg)    Ideal Body Weight: 124#  % Ideal Body Weight: 135%  Wt Readings from Last 10 Encounters:  12/15/12 168 lb (76.204 kg)  12/15/12 168 lb (76.204 kg)  12/09/12 168 lb (76.204 kg)  12/03/12 166 lb 0.1 oz (75.3 kg)  12/03/12 166 lb 0.1 oz (75.3 kg)  07/21/12 178 lb (80.74 kg)  07/09/12 179 lb (81.194 kg)  01/20/12 180 lb 6.4 oz (81.829 kg)  12/23/11 179 lb (81.194 kg)  12/22/11 179 lb 0.2 oz (81.2 kg)    Usual Body Weight: 179#  % Usual Body Weight: 94%  BMI:  Body mass index is 29.77 kg/(m^2). Meets criteria for overweight.   Estimated Nutritional  Needs: Kcal: 4098-1191 kcals daily Protein: 95-114 grams protein daily Fluid: 1.9-2.3 L fluid daily  Skin: abdominal incision and port  Diet Order: Full Liquid  EDUCATION NEEDS: -Education not appropriate at this time   Intake/Output Summary (Last 24 hours) at 12/21/12 1504 Last data filed at 12/20/12 1700  Gross per 24 hour  Intake    210 ml  Output      0 ml  Net    210 ml    Last BM: 12/21/12  Labs:   Recent Labs Lab 12/16/12 0445 12/19/12 0801 12/20/12 0450  NA 133* 134* 137  K 4.0 3.2* 3.5  CL 102 103 102  CO2 24 23 26   BUN 11 11 11   CREATININE 0.66 0.59 0.68  CALCIUM 8.4 8.5 8.9  MG 1.9  --   --   PHOS 2.9  --   --   GLUCOSE 112* 140* 111*    CBG (last 3)  No results found for this basename: GLUCAP,  in the last 72 hours  Scheduled Meds: . citalopram  10 mg Oral Daily  . darifenacin  7.5 mg Oral Daily  . enoxaparin (LOVENOX) injection  40 mg Subcutaneous Q24H  . [START ON 12/22/2012] influenza vac split quadrivalent PF  0.5 mL Intramuscular Tomorrow-1000  . magnesium hydroxide  30 mL Oral Daily  . memantine  10 mg Oral BID  . pantoprazole  40 mg Oral Q1200  .  tamsulosin  0.4 mg Oral Daily    Continuous Infusions: . dextrose 5 % and 0.45 % NaCl with KCl 20 mEq/L 20 mL/hr at 12/19/12 1216    Past Medical History  Diagnosis Date  . Urinary incontinence   . Psychotic disorder   . Diabetes mellitus, type 2   . Hypertension   . Bilateral cataracts 2011  . Vitamin D deficiency   . Mental retardation   . Depression   . Prostate cancer   . 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  . Microcytic anemia 12/03/2012  . HOH (hard of hearing)     Past Surgical History  Procedure Laterality Date  . Hemorroidectomy  2002  . Transurethral resection of prostate  July 09, 2007  . Colonoscopy N/A 12/04/2012    Procedure: COLONOSCOPY;  Surgeon: Corbin Ade, MD;  Location: [facility];  Service: Endoscopy;  Laterality: N/A;  .  Esophagogastroduodenoscopy N/A 12/04/2012    Procedure: ESOPHAGOGASTRODUODENOSCOPY (EGD);  Surgeon: Corbin Ade, MD;  Location: [facility];  Service: Endoscopy;  Laterality: N/A;  . Colon resection N/A 12/15/2012    Procedure: HAND ASSISTED LAPAROSCOPIC PARTIAL COLECTOMY;  Surgeon: Fabio Bering, MD;  Location: [facility];  Service: General;  Laterality: N/A;  . Colon resection N/A 12/15/2012    Procedure: COLON RESECTION;  Surgeon: Fabio Bering, MD;  Location: [facility];  Service: General;  Laterality: N/A;    Aashna Matson A. Mayford Knife, RD, LDN Pager: [phone]

## 2012-12-21 NOTE — Progress Notes (Signed)
  Subjective:    Patient ID: Xavier Tanner, male    DOB: 11/17/1950, 62 y.o.   MRN: [id number]  HPI Did not keep appt had surgery less than 1 wek ago, has appt scheduled for later this month will call to remind and f/u   Review of Systems     Objective:   Physical Exam        Assessment & Plan:

## 2012-12-21 NOTE — Progress Notes (Signed)
Pt states his stomach feels " upset " , zofran given, will continue to monitor symptoms.

## 2012-12-22 LAB — BASIC METABOLIC PANEL
CO2: 27 mEq/L (ref 19–32)
Chloride: 103 mEq/L (ref 96–112)
Creatinine, Ser: 0.66 mg/dL (ref 0.50–1.35)
GFR calc Af Amer: >90 mL/min (ref >90)
Potassium: 3.5 mEq/L (ref 3.5–5.1)
Sodium: 139 mEq/L (ref 135–145)

## 2012-12-22 LAB — CBC
MCV: 66.3 fL — ABNORMAL LOW (ref 78.0–100.0)
Platelets: 379 10*3/uL (ref 150–400)
RBC: 4.51 MIL/uL (ref 4.22–5.81)
RDW: 26.2 % — ABNORMAL HIGH (ref 11.5–15.5)
WBC: 7.1 10*3/uL (ref 4.0–10.5)

## 2012-12-22 LAB — CREATININE, SERUM
Creatinine, Ser: 0.7 mg/dL (ref 0.50–1.35)
GFR calc Af Amer: >90 mL/min (ref >90)

## 2012-12-22 NOTE — Progress Notes (Signed)
POD 6  Subjective: Patient still with reported emesis.  No complaints of increased pain.  Objective: Vital signs in last 24 hours: Temp:  [97 F (36.1 C)-98.7 F (37.1 C)] 97 F (36.1 C) (09/10 0827) Pulse Rate:  [78-94] 78 (09/10 0827) Resp:  [18] 18 (09/10 0827) BP: (124-165)/(71-85) 124/81 mmHg (09/10 0827) SpO2:  [96 %-98 %] 98 % (09/10 0827) Last BM Date: 12/22/12  Intake/Output from previous day: 09/09 0701 - 09/10 0700 In: 540 [P.O.:100; I.V.:440] Out: -  Intake/Output this shift:    General appearance: alert and no distress GI: +BS, soft, anticipated tenderness.  inc c/d/i.  Lab Results:   Recent Labs  12/20/12 0450 12/20/12 1959  WBC 8.7  --   HGB 7.5* 9.8*  HCT 25.4* 30.8*  PLT 379  --    BMET  Recent Labs  12/20/12 0450 12/22/12 0441  NA 137  --   K 3.5  --   CL 102  --   CO2 26  --   GLUCOSE 111*  --   BUN 11  --   CREATININE 0.68 0.70  CALCIUM 8.9  --    PT/INR No results found for this basename: LABPROT, INR,  in the last 72 hours ABG No results found for this basename: PHART, PCO2, PO2, HCO3,  in the last 72 hours  Studies/Results: No results found.  Anti-infectives: Anti-infectives   Start     Dose/Rate Route Frequency Ordered Stop   12/15/12 0759  cefOXitin (MEFOXIN) 2 g in dextrose 5 % 50 mL IVPB     2 g 100 mL/hr over 30 Minutes Intravenous On call to O.R. 12/15/12 0759 12/15/12 1011      Assessment/Plan: s/p Procedure(s): HAND ASSISTED LAPAROSCOPIC PARTIAL COLECTOMY (N/A) COLON RESECTION (N/A) Still with nausea, emesis.  No evidence of infectious process.  remains afebrile.  if persists will check ct 9/10  LOS: 7 days    Suezette Lafave C 12/22/2012

## 2012-12-22 NOTE — Evaluation (Signed)
Physical Therapy Evaluation Patient Details Name: Xavier Tanner MRN: [id number] DOB: 1950/10/20 Today's Date: 12/22/2012 Time: 4098-1191 PT Time Calculation (min): 26 min  PT Assessment / Plan / Recommendation History of Present Illness  Pt is admitted with elective surgery for a colectomy after a mass was found during recent admission.  He is a resident of a group home and is profoundly hard of hearing in addition to being mentally challenged.  Clinical Impression   Pt has no c/o today.  He has apparently not been out of bed since surgery last week.  Surprisingly, although his gross body movement is protective, his mobility is not compromised by his abdominal surgery.  He was able to transfer OOB and ambulate with no instability.  His ambulatory endurance is lower than PTA, but this should improve over time.    PT Assessment  Patent does not need any further PT services    Follow Up Recommendations  No PT follow up    Does the patient have the potential to tolerate intense rehabilitation      Barriers to Discharge        Equipment Recommendations  None recommended by PT    Recommendations for Other Services     Frequency      Precautions / Restrictions Precautions Precautions: None Restrictions Weight Bearing Restrictions: No   Pertinent Vitals/Pain       Mobility  Bed Mobility Bed Mobility: Supine to Sit Supine to Sit: 5: Supervision Transfers Sit to Stand: 6: Modified independent (Device/Increase time);From bed;With upper extremity assist Stand to Sit: 6: Modified independent (Device/Increase time);To chair/3-in-1;With upper extremity assist Ambulation/Gait Ambulation/Gait Assistance: 6: Modified independent (Device/Increase time) Ambulation Distance (Feet): 150 Feet Assistive device: None Gait Pattern: Within Functional Limits Gait velocity: WNL Stairs: No Wheelchair Mobility Wheelchair Mobility: No    Exercises     PT Diagnosis:    PT Problem List:    PT Treatment Interventions:       PT Goals(Current goals can be found in the care plan section) Acute Rehab PT Goals PT Goal Formulation: No goals set, d/c therapy  Visit Information  Last PT Received On: 12/22/12 History of Present Illness: Pt is admitted with elective surgery for a colectomy after a mass was found during recent admission.  He is a resident of a group home and is profoundly hard of hearing in addition to being mentally challenged.       Prior Functioning       Cognition  Cognition Arousal/Alertness: Awake/alert Behavior During Therapy: WFL for tasks assessed/performed Overall Cognitive Status: Within Functional Limits for tasks assessed    Extremity/Trunk Assessment Lower Extremity Assessment Lower Extremity Assessment: Overall WFL for tasks assessed Cervical / Trunk Assessment Cervical / Trunk Assessment: Normal   Balance Balance Balance Assessed: No (WNL by functional observation)  End of Session PT - End of Session Equipment Utilized During Treatment: Gait belt Activity Tolerance: Patient tolerated treatment well Patient left: in chair;with call bell/phone within reach;with chair alarm set Nurse Communication: Mobility status  GP     Xavier Tanner 12/22/2012, 12:21 PM

## 2012-12-22 NOTE — Progress Notes (Signed)
7 Days Post-Op  Subjective: Still complains of belly not feeling well. No emesis reported by RN. Positive bowel movement this morning.  Objective: Vital signs in last 24 hours: Temp:  [97 F (36.1 C)-98.7 F (37.1 C)] 97 F (36.1 C) (09/10 0827) Pulse Rate:  [78-94] 78 (09/10 0827) Resp:  [18] 18 (09/10 0827) BP: (124-165)/(71-85) 124/81 mmHg (09/10 0827) SpO2:  [96 %-98 %] 98 % (09/10 0827) Last BM Date: 12/22/12  Intake/Output from previous day: 09/09 0701 - 09/10 0700 In: 540 [P.O.:100; I.V.:440] Out: -  Intake/Output this shift:    General appearance: alert and no distress Resp: clear to auscultation bilaterally Cardio: regular rate and rhythm GI: Positive bowel sounds, soft, distended, moderate expected tenderness. No peritoneal signs. Incision is clean dry and intact.  Lab Results:   Recent Labs  12/20/12 0450 12/20/12 1959  WBC 8.7  --   HGB 7.5* 9.8*  HCT 25.4* 30.8*  PLT 379  --    BMET  Recent Labs  12/20/12 0450 12/22/12 0441  NA 137  --   K 3.5  --   CL 102  --   CO2 26  --   GLUCOSE 111*  --   BUN 11  --   CREATININE 0.68 0.70  CALCIUM 8.9  --    PT/INR No results found for this basename: LABPROT, INR,  in the last 72 hours ABG No results found for this basename: PHART, PCO2, PO2, HCO3,  in the last 72 hours  Studies/Results: No results found.  Anti-infectives: Anti-infectives   Start     Dose/Rate Route Frequency Ordered Stop   12/15/12 0759  cefOXitin (MEFOXIN) 2 g in dextrose 5 % 50 mL IVPB     2 g 100 mL/hr over 30 Minutes Intravenous On call to O.R. 12/15/12 0759 12/15/12 1011      Assessment/Plan: s/p Procedure(s): HAND ASSISTED LAPAROSCOPIC PARTIAL COLECTOMY (N/A) COLON RESECTION (N/A) Patient continues to have slow progression. While he has not had any episodes of emesis in the last 24 hours he still has some significant distention. His bowel movement was reported as loose. Morning labs are pending. Will check UA to  ensure there is no evidence of urinary tract infection compounding his slow progression. PT for increase activity. Total enhancement diet and hopeful discharge to his skilled nursing facility in the next 24 hours pending his progression and continued evaluation.  LOS: 7 days    Kandise Riehle C 12/22/2012

## 2012-12-22 NOTE — Clinical Social Work Note (Addendum)
CSW spoke with [name] at [facility] [employer] her on pt. [name] was concerned that pt had not been ambulating per RN report to her. Pt had PT evaluation and he was able to ambulate in hallway independently. CSW left voicemail with this information for [name] and informed her that we are hopeful for d/c tomorrow. She called back and confirmed this is okay.  Derenda Fennel, [place] [phone]

## 2012-12-23 MED ORDER — HYDROCODONE-ACETAMINOPHEN 5-325 MG PO TABS: 1.0000 | ORAL_TABLET | ORAL | Status: DC | PRN

## 2012-12-23 NOTE — Clinical Social Work Note (Signed)
Pt d/c today back to [facility]. Facility notified and will pick up pt this evening. Script in packet. Melissa at DSS notified via voicemail.   Derenda Fennel, [place] [phone]

## 2012-12-23 NOTE — Discharge Summary (Addendum)
Physician Discharge Summary  Patient ID: Xavier Tanner MRN: [id number] DOB/AGE: 1950/07/18 62 y.o.  Admit date: 12/15/2012 Discharge date: 12/23/2012  Admission Diagnoses:  Colon mass  Discharge Diagnoses: Colon cancer     Acute on chronic blood loss anemia 01/10/2013 Active Problems:   * No active hospital problems. *   Discharged Condition: stable  Hospital Course: Patient presented for definitive surgery.  Underwent unremarkable laparoscopic hand-assisted partial colectomy.  Post-op progression somewhat limited due to emesis.  This improved an patient was advanced without difficulty.  Plans to discharge.  Consults: None  Significant Diagnostic Studies: labs: daily  Treatments: surgery: l/s ha partial colectomy  Discharge Exam: Blood pressure 133/68, pulse 88, temperature 98.2 F (36.8 C), temperature source Oral, resp. rate 18, height 5\' 3"  (1.6 m), weight 76.204 kg (168 lb), SpO2 98.00%. General appearance: alert and no distress Cardio: regular rate and rhythm GI: +BS, soft expected tenderness.  incision c/d/i.  Disposition: 04-Intermediate Care Facility  Discharge Orders   Future Appointments Provider Department Dept Phone   01/06/2013 10:00 AM Kerri Perches, MD [facility] [phone]   Patient should bring all necessary paperwork to be completed.  Arrive 15 minutes prior to the appointment.   Future Orders Complete By Expires   Diet - low sodium heart healthy  As directed    Discharge instructions  As directed    Comments:     Increase activity as tolerated.   Increase activity slowly  As directed        Medication List         citalopram 10 MG tablet  Commonly known as:  CELEXA  Give three 10 mg tablets daily (30 mg) for 2 weeks. Then, give 2 tablets daily thereafter for a total of 20 mg daily.     fluticasone 50 MCG/ACT nasal spray  Commonly known as:  FLONASE  Place 1 spray into the nose daily.     hydrochlorothiazide 12.5 MG capsule   Commonly known as:  MICROZIDE  Take 1 capsule (12.5 mg total) by mouth daily.     HYDROcodone-acetaminophen 5-325 MG per tablet  Commonly known as:  NORCO  Take 1-2 tablets by mouth every 4 (four) hours as needed for pain.     hydrocortisone cream 1 %  Apply 1 application topically 3 (three) times daily. Apply to affected area     magnesium hydroxide 400 MG/5ML suspension  Commonly known as:  MILK OF MAGNESIA  Take 30 mLs by mouth 3 (three) times daily as needed for constipation (If no bowel movement after 3 days may repeat x3 doses).     memantine 10 MG tablet  Commonly known as:  NAMENDA  Take 10 mg by mouth 2 (two) times daily.     omeprazole 20 MG capsule  Commonly known as:  PRILOSEC  TAKE 1 CAPSULE BY MOUTH ONCE DAILY. **DO NOT CRUSH**     pravastatin 40 MG tablet  Commonly known as:  PRAVACHOL  TAKE 1 TABLET BY MOUTH AT BEDTIME.     solifenacin 5 MG tablet  Commonly known as:  VESICARE  Take 5 mg by mouth daily.     tamsulosin 0.4 MG Caps capsule  Commonly known as:  FLOMAX  TAKE 1 CAPSULE BY MOUTH ONCE DAILY. **DO NOT CRUSH**           Follow-up Information   Follow up with Malynda Smolinski C, MD In 2 weeks.   Specialty:  General Surgery   Contact information:  [address] [place] [address] [phone]       Signed: Fabio Bering 12/23/2012, 4:02 PM

## 2012-12-28 ENCOUNTER — Ambulatory Visit: Payer: Medicare Other | Admitting: Family Medicine

## 2012-12-28 ENCOUNTER — Telehealth: Payer: Self-pay | Admitting: Family Medicine

## 2012-12-28 NOTE — Telephone Encounter (Signed)
Message copied by Elesa Hacker on Tue Dec 28, 2012  7:50 AM ------      Message from: Syliva Overman E      Created: Tue Dec 21, 2012 11:13 PM      Regarding: pls ensure facility knows he missed appt today, needs to keep appt later this month       Remind them of the date.      I am concerned esp since recently dx with colon ca, let them understand, I suspect since he had surgery less than 1 week ago this may be why did not come today.      Just trying to maintain f/u ------

## 2013-01-06 ENCOUNTER — Ambulatory Visit (INDEPENDENT_AMBULATORY_CARE_PROVIDER_SITE_OTHER): Payer: Medicare Other | Admitting: Family Medicine

## 2013-01-06 ENCOUNTER — Encounter: Payer: Self-pay | Admitting: Family Medicine

## 2013-01-06 VITALS — BP 120/76 | HR 85 | Resp 16 | Wt 154.4 lb

## 2013-01-06 DIAGNOSIS — B369 Superficial mycosis, unspecified: Secondary | ICD-10-CM

## 2013-01-06 DIAGNOSIS — B356 Tinea cruris: Secondary | ICD-10-CM

## 2013-01-06 DIAGNOSIS — Z0000 Encounter for general adult medical examination without abnormal findings: Secondary | ICD-10-CM

## 2013-01-06 MED ORDER — TERBINAFINE HCL 1 % EX CREA: TOPICAL_CREAM | Freq: Two times a day (BID) | CUTANEOUS | Status: DC

## 2013-01-06 NOTE — Progress Notes (Signed)
Subjective:    Patient ID: Xavier Tanner, male    DOB: [birth year], 62 y.o.   MRN: [id number]  HPI  Preventive Screening-Counseling & Management   Patient present here today for a Medicare annual wellness visit. Recently had partial colectomy for colon cancer, plan is that he is be seen by oncology for further assesment and treatment as indicated, this will be arranged by the surgeon reportedly  Current Problems (verified)   Medications Prior to Visit Allergies (verified)   PAST HISTORY  Family History  Mother has colon cancer lives in [place]  Social History Mentally challenged with mental health illness, ward of the state, no known siblings  No drug use   Risk Factors  Current exercise habits:  Inadequate, needs to commit  Dietary issues discussed:low sugar , a lot of vegetable   Cardiac risk factors: none significant  Depression Screen  (Note: if answer to either of the following is "Yes", a more complete depression screening is indicated)   Over the past two weeks, have you felt down, depressed or hopeless? Yes, withdrawn since surgery Over the past two weeks, have you felt little interest or pleasure in doing things? yes  Have you lost interest or pleasure in daily life? yes  Do you often feel hopeless? yes  Do you cry easily over simple problems? No  Pt to be seen by his psychiatrist asap, mental health meds were changed during recent hospitalization , and this is li kely contributing to the deterioration in his mental health currently Activities of Daily Living  In your present state of health, do you have any difficulty performing the following activities?  Driving?: unable Managing money?: yeas Feeding yourself?:No Getting from bed to chair?:No Climbing a flight of stairs?:yes needs assistanceo Preparing food and eating?:yes Bathing or showering?:yes Getting dressed?:yes Getting to the toilet?:No Using the toilet?:No Moving around from place to  place?: No  Fall Risk Assessment In the past year have you fallen or had a near fall?:No Are you currently taking any medications that make you dizzy?:No   Hearing Difficulties: No Do you often ask people to speak up or repeat themselves?:yes Do you experience ringing or noises in your ears?:yes Do you have difficulty understanding soft or whispered voices?:yes  Cognitive Testing  Alert? Yes Normal Appearance?No , blunted affect Oriented to person? no Place? Yes  Time? no  Displays appropriate judgment?noCan read the correct time from a watch face? no Are you having problems remembering things?yes  Advanced Directives have been discussed with the patient?Yes , full code per caregiver   List the Names of Other Physician/Practitioners you currently use: Dr Marliss Coots, Dr Ave Filter. psych ([place])Dr Gerilyn Pilgrim   Indicate any recent Medical Services you may have received from other than [facility] providers in the past year (date may be approximate).   Assessment:    Annual Wellness Exam   Plan:    During the course of the visit the patient was educated and counseled about appropriate screening and preventive services including:  A healthy diet is rich in fruit, vegetables and whole grains. Poultry fish, nuts and beans are a healthy choice for protein rather then red meat. A low sodium diet and drinking 64 ounces of water daily is generally recommended. Oils and sweet should be limited. Carbohydrates especially for those who are diabetic or overweight, should be limited to 30-45 gram per meal. It is important to eat on a regular schedule, at least 3 times daily. Snacks should be primarily fruits, vegetables  or nuts. It is important that you exercise regularly at least 30 minutes 5 times a week. If you develop chest pain, have severe difficulty breathing, or feel very tired, stop exercising immediately and seek medical attention  Immunization reviewed and updated. Cancer screening reviewed and  updated    Patient Instructions (the written plan) was given to the patient.  Medicare Attestation  I have personally reviewed:  The patient's medical and social history  Their use of alcohol, tobacco or illicit drugs  Their current medications and supplements  The patient's functional ability including ADLs,fall risks, home safety risks, cognitive, and hearing and visual impairment  Diet and physical activities  Evidence for depression or mood disorders  The patient's weight, height, BMI, and visual acuity have been recorded in the chart. I have made referrals, counseling, and provided education to the patient based on review of the above and I have provided the patient with a written personalized care plan for preventive services.     Review of Systems     Objective:   Physical Exam        Assessment & Plan:

## 2013-01-06 NOTE — Patient Instructions (Addendum)
F/u in early December, call if you need me before   Please   Get shingles vaccine at the pharmacy   Keep f/u with Dr Marliss Coots and to the cancer specialist  You need an appointment with Dr Tomasa Rand as soon as possible you will not stay depressed  Antifungal cream to groin twice daily for 1 week

## 2013-01-08 DIAGNOSIS — Z0000 Encounter for general adult medical examination without abnormal findings: Secondary | ICD-10-CM | POA: Insufficient documentation

## 2013-01-08 NOTE — Assessment & Plan Note (Signed)
Recently noted to be scratching groin area in the past week, by caregiver. Physical exam reveals mild tinea cruris, topical antifungal twice daily for 1 week then as needed  Importance of keeping area dry is stressed

## 2013-01-08 NOTE — Assessment & Plan Note (Signed)
Annual wellness as documented. Pt is unable to live independently due to mental retardation as well as psychotic disorder. He is a ward of the state. Limited in all areas, and is on medication by psychiatry both for dementia and depression. Recent challenge of colon cancer , and also has a dx of prostate cancer

## 2013-01-10 NOTE — Clinical Documentation Improvement (Signed)
Please see patient's updated discharge summary to reflect acute on chronic blood loss anemia

## 2013-01-26 ENCOUNTER — Encounter (HOSPITAL_COMMUNITY): Payer: Medicare Other | Attending: Hematology and Oncology

## 2013-01-26 ENCOUNTER — Encounter (HOSPITAL_COMMUNITY): Payer: Self-pay

## 2013-01-26 VITALS — BP 114/70 | HR 71 | Temp 97.9°F | Resp 18 | Wt 153.0 lb

## 2013-01-26 DIAGNOSIS — D500 Iron deficiency anemia secondary to blood loss (chronic): Secondary | ICD-10-CM

## 2013-01-26 DIAGNOSIS — F79 Unspecified intellectual disabilities: Secondary | ICD-10-CM

## 2013-01-26 DIAGNOSIS — F29 Unspecified psychosis not due to a substance or known physiological condition: Secondary | ICD-10-CM

## 2013-01-26 DIAGNOSIS — C186 Malignant neoplasm of descending colon: Secondary | ICD-10-CM

## 2013-01-26 DIAGNOSIS — C182 Malignant neoplasm of ascending colon: Secondary | ICD-10-CM

## 2013-01-26 DIAGNOSIS — Z8546 Personal history of malignant neoplasm of prostate: Secondary | ICD-10-CM

## 2013-01-26 NOTE — Progress Notes (Signed)
[facility] NEW PATIENT EVALUATION   Name: Xavier Tanner Date: 01/26/2013 MRN: [id number] DOB: [birth year]  PCP: Syliva Overman, MD   REFERRING PHYSICIAN: Kerri Perches, MD  REASON FOR REFERRAL: Colon cancer, role of adjuvant treatment.     HISTORY OF PRESENT ILLNESS:Xavier Tanner is a 62 y.o. 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 The patient is mentally retarded and is taking medication for dementia. He is accompanied by a caregiver today. His power of attorney is [name] who will visit here tomorrow. Since his surgery, the patient has been eating well with no nausea, vomiting, abdominal pain, melena, hematochezia, hematuria, fever, night sweats, cough, wheezing, headache, or seizures.   PAST MEDICAL HISTORY:  has a past medical history of Urinary incontinence; Psychotic disorder; Diabetes mellitus, type 2; Hypertension; Bilateral cataracts (2011); Vitamin D deficiency; Mental retardation; Depression; Microcytic anemia (12/03/2012); HOH (hard of hearing); Prostate cancer; and Colon cancer, ascending (12/04/2012).     PAST SURGICAL HISTORY: Past Surgical History  Procedure Laterality Date  . Hemorroidectomy  2002  . Transurethral resection of prostate  July 09, 2007  . Colonoscopy N/A 12/04/2012    Procedure: COLONOSCOPY;  Surgeon: Corbin Ade, MD;  Location: [facility];  Service: Endoscopy;  Laterality: N/A;  . Esophagogastroduodenoscopy N/A 12/04/2012    Procedure: ESOPHAGOGASTRODUODENOSCOPY (EGD);  Surgeon: Corbin Ade, MD;  Location: [facility];  Service: Endoscopy;  Laterality: N/A;  . Colon resection N/A 12/15/2012    Procedure: HAND ASSISTED LAPAROSCOPIC PARTIAL COLECTOMY;  Surgeon: Fabio Bering, MD;  Location: [facility] ORS;  Service: General;  Laterality: N/A;  . Colon resection N/A 12/15/2012    Procedure: COLON RESECTION;  Surgeon: Fabio Bering, MD;  Location: [facility] ORS;  Service: General;  Laterality: N/A;     CURRENT MEDICATIONS: has a current medication list which includes the following prescription(s): alum & mag hydroxide-simeth, benzonatate, bisacodyl, diphenhydramine, fluticasone, guaifenesin, hydrochlorothiazide, hydrocodone-acetaminophen, hydrocortisone cream, loperamide, magnesium hydroxide, memantine, omeprazole, pravastatin, solifenacin, tamsulosin, and terbinafine.   ALLERGIES: Ace inhibitors   SOCIAL HISTORY:  reports that he has never smoked. He does not have any smokeless tobacco history on file. He reports that he does not drink alcohol or use illicit drugs.   FAMILY HISTORY: family history includes Diabetes in his mother; Hypertension in his mother; Mental illness in his mother; Stroke in his father.    REVIEW OF SYSTEMS:  Other than that discussed above is noncontributory.    PHYSICAL EXAM:  vitals were not taken for this visit.   GENERAL:alert, no distress and comfortable SKIN: skin color, texture, turgor are normal, no rashes or significant lesions EYES: normal, Conjunctiva are pink and non-injected, sclera clear OROPHARYNX:no exudate, no erythema and lips, buccal mucosa, and tongue normal  NECK: supple, thyroid normal size, non-tender, without nodularity CHEST: Normal AP diameter with no gynecomastia. LYMPH:  no palpable lymphadenopathy in the cervical, axillary or inguinal LUNGS: clear to auscultation and percussion with normal breathing effort HEART: regular rate & rhythm and no murmurs ABDOMEN:abdomen soft, non-tender and normal bowel sounds. Perianal erythema involving the perineum as well. MUSCULOSKELETALl:no cyanosis of digits, no clubbing or edema  NEURO: alert & oriented x 3 with fluent speech, no focal motor/sensory deficits. Increase motor activity of his upper extremities as well as head and neck that his purposes. Answers questions appropriately most of the time but has poor  recollection of current events.  LABORATORY DATA:  No visits with results within 30 Day(s) from this visit. Latest known visit with results is:  Admission on 12/15/2012, Discharged on 12/23/2012  Component Date Value Range Status  . Order Confirmation 12/15/2012 ORDER PROCESSED BY BLOOD BANK   Final  . ABO/RH(D) 12/15/2012 O POS   Final  . Antibody Screen 12/15/2012 NEG   Final  . Sample Expiration 12/15/2012 12/18/2012   Final  . Unit Number 12/15/2012 [id number]   Final  . Blood Component Type 12/15/2012 RED CELLS,LR   Final  . Unit division 12/15/2012 00   Final  . Status of Unit 12/15/2012 REL FROM [facility]   Final  . Transfusion Status 12/15/2012 OK TO TRANSFUSE   Final  . Crossmatch Result 12/15/2012 Compatible   Final  . Unit Number 12/15/2012 [id number]   Final  . Blood Component Type 12/15/2012 RED CELLS,LR   Final  . Unit division 12/15/2012 00   Final  . Status of Unit 12/15/2012 REL FROM [facility]   Final  . Transfusion Status 12/15/2012 OK TO TRANSFUSE   Final  . Crossmatch Result 12/15/2012 Compatible   Final  . Glucose-Capillary 12/15/2012 94  70 - 99 mg/dL Final  . Glucose-Capillary 12/15/2012 134* 70 - 99 mg/dL Final  . WBC 30/86/5784 9.1  4.0 - 10.5 K/uL Final  . RBC 12/15/2012 4.66  4.22 - 5.81 MIL/uL Final  . Hemoglobin 12/15/2012 8.9* 13.0 - 17.0 g/dL Final  . HCT 69/62/9528 29.9* 39.0 - 52.0 % Final  . MCV 12/15/2012 64.2* 78.0 - 100.0 fL Final  . MCH 12/15/2012 19.1* 26.0 - 34.0 pg Final  . MCHC 12/15/2012 29.8* 30.0 - 36.0 g/dL Final  . RDW 41/32/4401 23.8* 11.5 - 15.5 % Final  . Platelets 12/15/2012 297  150 - 400 K/uL Final  . Creatinine, Ser 12/15/2012 0.68  0.50 - 1.35 mg/dL Final  . GFR calc non Af Amer 12/15/2012 >90  >90 mL/min Final  . GFR calc Af Amer 12/15/2012 >90  >90 mL/min Final   Comment: (NOTE)                          The eGFR has been calculated using the CKD EPI equation.                          This calculation has not been  validated in all clinical situations.                          eGFR's persistently <90 mL/min signify possible Chronic Kidney                          Disease.  . Sodium 12/16/2012 133* 135 - 145 mEq/L Final  . Potassium 12/16/2012 4.0  3.5 - 5.1 mEq/L Final  . Chloride 12/16/2012 102  96 - 112 mEq/L Final  . CO2 12/16/2012 24  19 - 32 mEq/L Final  . Glucose, Bld 12/16/2012 112* 70 - 99 mg/dL Final  . BUN 02/72/5366 11  6 - 23 mg/dL Final  . Creatinine, Ser 12/16/2012 0.66  0.50 - 1.35 mg/dL Final  . Calcium 44/06/4740 8.4  8.4 - 10.5 mg/dL Final  . GFR calc non Af Amer 12/16/2012 >90  >90 mL/min Final  . GFR calc Af Amer 12/16/2012 >90  >90 mL/min Final   Comment: (NOTE)  The eGFR has been calculated using the CKD EPI equation.                          This calculation has not been validated in all clinical situations.                          eGFR's persistently <90 mL/min signify possible Chronic Kidney                          Disease.  . Magnesium 12/16/2012 1.9  1.5 - 2.5 mg/dL Final  . Phosphorus 40/98/1191 2.9  2.3 - 4.6 mg/dL Final  . WBC 47/82/9562 12.8* 4.0 - 10.5 K/uL Final  . RBC 12/16/2012 4.20* 4.22 - 5.81 MIL/uL Final  . Hemoglobin 12/16/2012 8.0* 13.0 - 17.0 g/dL Final  . HCT 13/11/6576 26.5* 39.0 - 52.0 % Final  . MCV 12/16/2012 63.1* 78.0 - 100.0 fL Final  . MCH 12/16/2012 19.0* 26.0 - 34.0 pg Final  . MCHC 12/16/2012 30.2  30.0 - 36.0 g/dL Final  . RDW 46/96/2952 23.4* 11.5 - 15.5 % Final  . Platelets 12/16/2012 313  150 - 400 K/uL Final  . WBC 12/19/2012 10.6* 4.0 - 10.5 K/uL Final  . RBC 12/19/2012 4.12* 4.22 - 5.81 MIL/uL Final  . Hemoglobin 12/19/2012 7.7* 13.0 - 17.0 g/dL Final  . HCT 84/13/2440 25.6* 39.0 - 52.0 % Final  . MCV 12/19/2012 62.1* 78.0 - 100.0 fL Final  . MCH 12/19/2012 18.7* 26.0 - 34.0 pg Final  . MCHC 12/19/2012 30.1  30.0 - 36.0 g/dL Final  . RDW 02/08/2535 23.9* 11.5 - 15.5 % Final  . Platelets 12/19/2012 349   150 - 400 K/uL Final  . Sodium 12/19/2012 134* 135 - 145 mEq/L Final  . Potassium 12/19/2012 3.2* 3.5 - 5.1 mEq/L Final  . Chloride 12/19/2012 103  96 - 112 mEq/L Final  . CO2 12/19/2012 23  19 - 32 mEq/L Final  . Glucose, Bld 12/19/2012 140* 70 - 99 mg/dL Final  . BUN 64/40/3474 11  6 - 23 mg/dL Final  . Creatinine, Ser 12/19/2012 0.59  0.50 - 1.35 mg/dL Final  . Calcium 25/95/6387 8.5  8.4 - 10.5 mg/dL Final  . GFR calc non Af Amer 12/19/2012 >90  >90 mL/min Final  . GFR calc Af Amer 12/19/2012 >90  >90 mL/min Final   Comment: (NOTE)                          The eGFR has been calculated using the CKD EPI equation.                          This calculation has not been validated in all clinical situations.                          eGFR's persistently <90 mL/min signify possible Chronic Kidney                          Disease.  . Sodium 12/20/2012 137  135 - 145 mEq/L Final  . Potassium 12/20/2012 3.5  3.5 - 5.1 mEq/L Final  . Chloride 12/20/2012 102  96 - 112 mEq/L Final  . CO2 12/20/2012 26  19 - 32 mEq/L Final  .  Glucose, Bld 12/20/2012 111* 70 - 99 mg/dL Final  . BUN 16/01/9603 11  6 - 23 mg/dL Final  . Creatinine, Ser 12/20/2012 0.68  0.50 - 1.35 mg/dL Final  . Calcium 54/12/8117 8.9  8.4 - 10.5 mg/dL Final  . GFR calc non Af Amer 12/20/2012 >90  >90 mL/min Final  . GFR calc Af Amer 12/20/2012 >90  >90 mL/min Final   Comment: (NOTE)                          The eGFR has been calculated using the CKD EPI equation.                          This calculation has not been validated in all clinical situations.                          eGFR's persistently <90 mL/min signify possible Chronic Kidney                          Disease.  . WBC 12/20/2012 8.7  4.0 - 10.5 K/uL Final  . RBC 12/20/2012 4.04* 4.22 - 5.81 MIL/uL Final  . Hemoglobin 12/20/2012 7.5* 13.0 - 17.0 g/dL Final  . HCT 14/78/2956 25.4* 39.0 - 52.0 % Final  . MCV 12/20/2012 62.9* 78.0 - 100.0 fL Final  . MCH 12/20/2012  18.6* 26.0 - 34.0 pg Final  . MCHC 12/20/2012 29.5* 30.0 - 36.0 g/dL Final  . RDW 21/30/8657 24.2* 11.5 - 15.5 % Final  . Platelets 12/20/2012 379  150 - 400 K/uL Final  . Order Confirmation 12/20/2012 ORDER PROCESSED BY BLOOD BANK   Final  . ABO/RH(D) 12/20/2012 O POS   Final  . Antibody Screen 12/20/2012 NEG   Final  . Sample Expiration 12/20/2012 12/23/2012   Final  . Unit Number 12/20/2012 [id number]   Final  . Blood Component Type 12/20/2012 RED CELLS,LR   Final  . Unit division 12/20/2012 00   Final  . Status of Unit 12/20/2012 ISSUED,FINAL   Final  . Transfusion Status 12/20/2012 OK TO TRANSFUSE   Final  . Crossmatch Result 12/20/2012 Compatible   Final  . Unit Number 12/20/2012 [id number]   Final  . Blood Component Type 12/20/2012 RED CELLS,LR   Final  . Unit division 12/20/2012 00   Final  . Status of Unit 12/20/2012 ISSUED,FINAL   Final  . Transfusion Status 12/20/2012 OK TO TRANSFUSE   Final  . Crossmatch Result 12/20/2012 Compatible   Final  . Hemoglobin 12/20/2012 9.8* 13.0 - 17.0 g/dL Final   POST TRANSFUSION SPECIMEN  . HCT 12/20/2012 30.8* 39.0 - 52.0 % Final  . Creatinine, Ser 12/22/2012 0.70  0.50 - 1.35 mg/dL Final  . GFR calc non Af Amer 12/22/2012 >90  >90 mL/min Final  . GFR calc Af Amer 12/22/2012 >90  >90 mL/min Final   Comment: (NOTE)                          The eGFR has been calculated using the CKD EPI equation.                          This calculation has not been validated in all clinical situations.  eGFR's persistently <90 mL/min signify possible Chronic Kidney                          Disease.  . WBC 12/22/2012 7.1  4.0 - 10.5 K/uL Final  . RBC 12/22/2012 4.51  4.22 - 5.81 MIL/uL Final  . Hemoglobin 12/22/2012 9.3* 13.0 - 17.0 g/dL Final  . HCT 16/01/9603 29.9* 39.0 - 52.0 % Final  . MCV 12/22/2012 66.3* 78.0 - 100.0 fL Final  . MCH 12/22/2012 20.6* 26.0 - 34.0 pg Final  . MCHC 12/22/2012 31.1  30.0 - 36.0 g/dL Final   . RDW 54/12/8117 26.2* 11.5 - 15.5 % Final  . Platelets 12/22/2012 379  150 - 400 K/uL Final  . Sodium 12/22/2012 139  135 - 145 mEq/L Final  . Potassium 12/22/2012 3.5  3.5 - 5.1 mEq/L Final  . Chloride 12/22/2012 103  96 - 112 mEq/L Final  . CO2 12/22/2012 27  19 - 32 mEq/L Final  . Glucose, Bld 12/22/2012 102* 70 - 99 mg/dL Final  . BUN 14/78/2956 18  6 - 23 mg/dL Final  . Creatinine, Ser 12/22/2012 0.66  0.50 - 1.35 mg/dL Final  . Calcium 21/30/8657 8.8  8.4 - 10.5 mg/dL Final  . GFR calc non Af Amer 12/22/2012 >90  >90 mL/min Final  . GFR calc Af Amer 12/22/2012 >90  >90 mL/min Final   Comment: (NOTE)                          The eGFR has been calculated using the CKD EPI equation.                          This calculation has not been validated in all clinical situations.                          eGFR's persistently <90 mL/min signify possible Chronic Kidney                          Disease.    Urinalysis    Component Value Date/Time   COLORURINE STRAW* 12/22/2011 2143   APPEARANCEUR CLEAR 12/22/2011 2143   LABSPEC <1.005* 12/22/2011 2143   PHURINE 6.0 12/22/2011 2143   GLUCOSEU NEGATIVE 12/22/2011 2143   HGBUR MODERATE* 12/22/2011 2143   BILIRUBINUR neg 07/21/2012 1544   BILIRUBINUR NEGATIVE 12/22/2011 2143   KETONESUR NEGATIVE 12/22/2011 2143   PROTEINUR NEGATIVE 12/22/2011 2143   UROBILINOGEN 0.2 07/21/2012 1544   UROBILINOGEN 0.2 12/22/2011 2143   NITRITE neg 07/21/2012 1544   NITRITE NEGATIVE 12/22/2011 2143   LEUKOCYTESUR Trace 07/21/2012 1544      @RADIOGRAPHY : No results found.  PATHOLOGY: for CHASON, MCIVER ([id number]) Patient: BRITTIN, JANIK Collected: 12/04/2012 Client: [facility] Accession: [id number] Received: 12/06/2012 Eula Listen DOB: Aug 17, 1950 Age: 57 Gender: M Reported: 12/07/2012 [address] Patient Ph: [phone] MRN #: [id number] [place] [address] Visit #: [id number] Chart #: Phone: [phone] Fax: CC: REPORT OF SURGICAL PATHOLOGY FINAL  DIAGNOSIS Diagnosis Colon, biopsy, lesion at ascending colon 10 cm from ileocecal valve - POSITIVE FOR ADENOCARCINOMA. Microscopic Comment The findings are called to Eula Listen on 12/07/12. Dr. Frederica Kuster has seen this case in consultation with agreement. (RAH:caf 12/07/12) Zandra Abts MD Pathologist, Electronic Signature (Case signed 12/07/2012) Specimen Gross and Clinical Information Specimen(s) Obtained:  Colon, biopsy, lesion at ascending colon 10 cm from ileocecal valve Specimen Clinical Information heme positive stool, microcytic anemia Gross Received in formalin are tan, soft tissue fragments that are submitted in toto. Number: six pieces, Size: 0.2 to 0.3 cm, in aggregate 0.8 x 0.7 x 0.2 cm. (GRP:ecj 12/06/2012) Report signed out from the following location(s) Technical Component performed at [facility] [address],[place], [place] [address] CLIA: [id number]., Interpretation performed at [facility] [address], [place], [place] [address]. CLIA #: [id number],  for COULTON, SCHLINK ([id number]) Patient: SHAHZAD, THOMANN Collected: 12/15/2012 Client: [facility] Accession: [id number] Received: 12/15/2012 Tilford Pillar DOB: [birth year] Age: 68 Gender: M Reported: 12/17/2012 [address] Patient Ph: [phone] MRN #: [id number] [place] [address] Visit #: [id number] Chart #: Phone: [phone] Fax: CC: REPORT OF SURGICAL PATHOLOGY FINAL DIAGNOSIS Diagnosis Colon, segmental resection for tumor, right colon - INVASIVE ADENOCARCINOMA, INVADING THROUGH THE MUSCULARIS PROPRIA INTO PERICOLONIC FATTY TISSUE. - NO EVIDENCE OF ANGIOLYMPHATIC INVASION OR PERINEURAL INVASION IDENTIFIED. - EIGHTEEN LYMPH NODES, NEGATIVE FOR METASTATIC CARCINOMA (0/18). - RESECTION MARGINS ARE NEGATIVE FOR ATYPIA OR MALIGNANCY. Microscopic Comment COLON: Specimen: Right colon Procedure: Segmental resection Tumor site: Cecum/ascending colon Specimen integrity:  Intact Macroscopic intactness of mesorectum: N/A Macroscopic tumor perforation: No Invasive tumor: Maximum size: 3.0 cm Histologic type(s): Invasive adenocarcinoma. Histologic grade and differentiation: G2: moderately differentiated/low grade Type of polyp in which invasive carcinoma arose: Tubular adenoma Microscopic extension of invasive tumor: Invading through the muscularis propria into pericolonic tissue Lymph-Vascular invasion: Not identified Peri-neural invasion: Not identified Tumor deposit(s) (discontinuous extramural extension): N/A Resection margins: Proximal margin: 14 cm Distal margin: 12 cm Circumferential (radial) (posterior ascending, posterior descending; lateral and posterior mid-rectum; and entire lower 1/3 rectum): 2.5 cm Treatment effect (neoadjuvant therapy): No Number of lymph nodes examined 18; Number positive 0 Additional polyp(s): N/A Non-neoplastic findings: N/A 1 of 2 FINAL for LARWENCE, TU ([id number]) Microscopic Comment(continued) Pathologic Staging: pT3, N0, MX Ancillary studies: MSI testing by PCR and MMR IHC study will be performed and an addendum report will follow. (HCL:kh 12-17-12) Abigail Miyamoto MD Pathologist, Electronic Signature (Case signed 12/17/2012) Specimen Gross and Clinical Information Specimen(s) Obtained: Colon, segmental resection for tumor, right colon Specimen Clinical Information colon cancer Gross Specimen: Right colon. Specimen integrity: Intact. Specimen length: 9 cm of terminal ileum and 24 cm of colon. Please note, no appendix was grossly appreciated. Mesorectal intactness: Not applicable. Tumor location: Cecum/ascending. Tumor size: Well delineated 3.0 x 2.0 x 1.9 cm mass with raised borders. Percent of bowel circumference involved: 40%. Tumor distance to margins: Proximal: 14 cm Distal: 12 cm Radial (posterior ascending, posterior descending; lateral and posterior mid-rectum; and entire lower 1/3 rectum):  2.5cm Macroscopic extent of tumor invasion: Tumor invades through the muscularis propria into the subserosal adipose tissue. Total presumed lymph nodes: 15. Block summary: A = proximal margin. B = distal margin. C - F = representative sections of tumor taken to serosal surface with attached subserosal fat. G= Tissue for molecular testing. H = two whole lymph node candidates. I = two whole lymph node candidates. J = five whole lymph node candidates. K = one bisected lymph node candidate. L = five whole lymph node candidates. (JK:ecj 12/16/2012) Report signed out from the following location(s) Technical Component and Interpretation performed at [facility] [address], [place],  [address]. CLIA #: [id number],  MICROSATELLITE STABLE ONCOTYPE DX COLON RECURRENCE SCORE 44--19% RECURRENCE RISK  AGGRESSIVE CHEMO-----12% RECURRENCE RISK                                      LESS AGGRESSIVE CHEMO------16% RECURRENCE RISK   IMPRESSION: #1. Stage II-a (T3, N0, M0) adenocarcinoma of the descending colon, status post segmental resection with clear margins and 15 lymph nodes all negative in the surgical specimen, microsatellite stable, Oncotype DX colon recurrence score 44 with an anticipated relapse rate of 19% over the next 5 years. #2. Mental retardation with mild dementia, functioning well in a group home. #3. History of cancer of the prostate, status post TURP. #4. Urinary incontinence. #5. Dermatophytosis of the groin and perianal region.   PLAN: #1. Will speak with the patient's power of attorney regarding whether or not adjuvant chemotherapy with Xeloda which will decrease his recurrence risk by 3% is warranted. More aggressive chemotherapy utilizing FOLFOX will decrease his recurrence risk to 12% but I do not believe she will tolerate the treatment well. #2. Followup schedule will depend upon tomorrow's  interview.   Maurilio Lovely, MD 01/26/2013 8:09 AM

## 2013-01-26 NOTE — Patient Instructions (Signed)
[facility] Discharge Instructions  RECOMMENDATIONS MADE BY THE CONSULTANT AND ANY TEST RESULTS WILL BE SENT TO YOUR REFERRING PHYSICIAN.  EXAM FINDINGS BY THE PHYSICIAN TODAY AND SIGNS OR SYMPTOMS TO REPORT TO CLINIC OR PRIMARY PHYSICIAN:   Papers given for Xavier Tanner to review.   When Xavier Tanner arrives on Thursday, Dr. Zigmund Daniel will speak to her about this situation and his plans for the patient.   If we pursue this route of treatment, giving the Xeloda tablets (chemo), then the patient will take the medication for 2 weeks in a row and have 1 week off. The medication will be given by a med tech where he lives.      Thank you for choosing [facility] to provide your oncology and hematology care.  To afford each patient quality time with our providers, please arrive at least 15 minutes before your scheduled appointment time.  With your help, our goal is to use those 15 minutes to complete the necessary work-up to ensure our physicians have the information they need to help with your evaluation and healthcare recommendations.    Effective January 1st, 2014, we ask that you re-schedule your appointment with our physicians should you arrive 10 or more minutes late for your appointment.  We strive to give you quality time with our providers, and arriving late affects you and other patients whose appointments are after yours.    Again, thank you for choosing [facility].  Our hope is that these requests will decrease the amount of time that you wait before being seen by our physicians.       _____________________________________________________________  Should you have questions after your visit to [facility], please contact our office at [phone] between the hours of 8:30 a.m. and 5:00 p.m.  Voicemails left after 4:30 p.m. will not be returned until the following business day.  For prescription refill requests, have your pharmacy  contact our office with your prescription refill request.    Capecitabine tablets What is this medicine? CAPECITABINE (ka pe SITE a been) is a chemotherapy drug. It slows the growth of cancer cells. This medicine is used to treat breast cancer, and also colon or rectal cancer. This medicine may be used for other purposes; ask your health care provider or pharmacist if you have questions. What should I tell my health care provider before I take this medicine? They need to know if you have any of these conditions: -bleeding or blood disorders -dihydropyrimidine dehydrogenase (DPD) deficiency -heart disease -infection (especially a virus infection such as chickenpox, cold sores, or herpes) -kidney disease -liver disease -an unusual or allergic reaction to capecitabine, 5-fluorouracil, other medicines, foods, dyes, or preservatives -pregnant or trying to get pregnant -breast-feeding How should I use this medicine? Take this medicine by mouth with a glass of water, within 30 minutes of the end of a meal. Follow the directions on the prescription label. Take your medicine at regular intervals. Do not take it more often than directed. Do not stop taking except on your doctor's advice. Your doctor may want you to take a combination of 150 mg and 500 mg tablets for each dose. It is very important that you know how to correctly take your dose. Taking the wrong tablets could result in an overdose (too much medication) or underdose (too little medication). Talk to your pediatrician regarding the use of this medicine in children. Special care may be needed. Overdosage: If you  think you have taken too much of this medicine contact a poison control center or emergency room at once. NOTE: This medicine is only for you. Do not share this medicine with others. What if I miss a dose? If you miss a dose, do not take the missed dose at all. Do not take double or extra doses. Instead, continue with your next  scheduled dose and check with your doctor. What may interact with this medicine? -antacids with aluminum and/or magnesium -folic acid -leucovorin -medicines to increase blood counts like filgrastim, pegfilgrastim, sargramostim -phenytoin -vaccines -warfarin Talk to your doctor or health care professional before taking any of these medicines: -acetaminophen -aspirin -ibuprofen -ketoprofen -naproxen This list may not describe all possible interactions. Give your health care provider a list of all the medicines, herbs, non-prescription drugs, or dietary supplements you use. Also tell them if you smoke, drink alcohol, or use illegal drugs. Some items may interact with your medicine. What should I watch for while using this medicine? Visit your doctor for checks on your progress. This drug may make you feel generally unwell. This is not uncommon, as chemotherapy can affect healthy cells as well as cancer cells. Report any side effects. Continue your course of treatment even though you feel ill unless your doctor tells you to stop. In some cases, you may be given additional medicines to help with side effects. Follow all directions for their use. Call your doctor or health care professional for advice if you get a fever, chills or sore throat, or other symptoms of a cold or flu. Do not treat yourself. This drug decreases your body's ability to fight infections. Try to avoid being around people who are sick. This medicine may increase your risk to bruise or bleed. Call your doctor or health care professional if you notice any unusual bleeding. Be careful brushing and flossing your teeth or using a toothpick because you may get an infection or bleed more easily. If you have any dental work done, tell your dentist you are receiving this medicine. Avoid taking products that contain aspirin, acetaminophen, ibuprofen, naproxen, or ketoprofen unless instructed by your doctor. These medicines may hide a  fever. Do not become pregnant while taking this medicine. Women should inform their doctor if they wish to become pregnant or think they might be pregnant. There is a potential for serious side effects to an unborn child. Talk to your health care professional or pharmacist for more information. Do not breast-feed an infant while taking this medicine. Men are advised not to father a child while taking this medicine. What side effects may I notice from receiving this medicine? Side effects that you should report to your doctor or health care professional as soon as possible: -allergic reactions like skin rash, itching or hives, swelling of the face, lips, or tongue -low blood counts - this medicine may decrease the number of white blood cells, red blood cells and platelets. You may be at increased risk for infections and bleeding. -signs of infection - fever or chills, cough, sore throat, pain or difficulty passing urine -signs of decreased platelets or bleeding - bruising, pinpoint red spots on the skin, black, tarry stools, blood in the urine -signs of decreased red blood cells - unusually weak or tired, fainting spells, lightheadedness -breathing problems -changes in vision -chest pain -diarrhea of more than 4 bowel movements in one day or any diarrhea at night -mouth sores -nausea and vomiting -pain, swelling, redness at site where injected -pain, tingling,  numbness in the hands or feet -redness, swelling, or sores on hands or feet -stomach pain -vomiting -yellow color of skin or eyes Side effects that usually do not require medical attention (report to your doctor or health care professional if they continue or are bothersome): -constipation -diarrhea -dry or itchy skin -hair loss -loss of appetite -nausea -weak or tired This list may not describe all possible side effects. Call your doctor for medical advice about side effects. You may report side effects to FDA at  1-800-FDA-1088. Where should I keep my medicine? Keep out of the reach of children. Store at room temperature between 15 and 30 degrees C (59 and 86 degrees F). Keep container tightly closed. Throw away any unused medicine after the expiration date. NOTE: This sheet is a summary. It may not cover all possible information. If you have questions about this medicine, talk to your doctor, pharmacist, or health care provider.  2013, [employer]. (03/13/2008 11:47:41 AM)

## 2013-01-27 ENCOUNTER — Other Ambulatory Visit (HOSPITAL_COMMUNITY): Payer: Self-pay | Admitting: Hematology and Oncology

## 2013-01-27 MED ORDER — CAPECITABINE 500 MG PO TABS: 1000.0000 mg | ORAL_TABLET | Freq: Two times a day (BID) | ORAL | Status: DC

## 2013-01-27 NOTE — Progress Notes (Signed)
Spoke with [name] today who has the patient's power of attorney. She wishes that the patient receive adjuvant chemotherapy utilizing decitabine 1000 mg twice a day for 2 weeks on one week off for 8 cycles. Prescription was generated to obtain the drug through mail order pharmacy with instructions to have the patient come in with his caretaker and the medication when it is finally obtained so that details can be gone over Re: administration and what to watch for in regard to adverse effects. Melissa expressed understanding of this strategy.

## 2013-02-17 ENCOUNTER — Other Ambulatory Visit: Payer: Self-pay | Admitting: Family Medicine

## 2013-02-18 ENCOUNTER — Other Ambulatory Visit: Payer: Self-pay

## 2013-02-18 MED ORDER — PRAVASTATIN SODIUM 40 MG PO TABS: ORAL_TABLET | ORAL | Status: DC

## 2013-02-21 ENCOUNTER — Other Ambulatory Visit: Payer: Self-pay | Admitting: Family Medicine

## 2013-02-22 ENCOUNTER — Telehealth (HOSPITAL_COMMUNITY): Payer: Self-pay | Admitting: *Deleted

## 2013-02-22 NOTE — Telephone Encounter (Signed)
Emma from [facility] called to ask for the prescription for xeloda for this patient. Need info re: when this was supposed to start, where they will get med from. The group home is supposed to use only [employer]

## 2013-02-23 ENCOUNTER — Other Ambulatory Visit (HOSPITAL_COMMUNITY): Payer: Self-pay | Admitting: *Deleted

## 2013-02-23 ENCOUNTER — Ambulatory Visit (HOSPITAL_COMMUNITY): Payer: Medicare Other

## 2013-02-23 DIAGNOSIS — C182 Malignant neoplasm of ascending colon: Secondary | ICD-10-CM

## 2013-02-23 MED ORDER — ONDANSETRON HCL 8 MG PO TABS: 8.0000 mg | ORAL_TABLET | Freq: Three times a day (TID) | ORAL | Status: DC | PRN

## 2013-02-23 MED ORDER — DIPHENOXYLATE-ATROPINE 2.5-0.025 MG PO TABS: 1.0000 | ORAL_TABLET | Freq: Four times a day (QID) | ORAL | Status: DC | PRN

## 2013-02-23 NOTE — Progress Notes (Signed)
Chemo teaching done with [employer] med tech @ [facility] (patient's residence) via phone. I stressed the importance of only handling this drug with gloves on. I also instructed on the importance of giving Xeloda AFTER the am and pm meals! I included all of this in my teaching materials that I faxed which can also be found under my encounter. Drug company has contacted [employer] regarding delivery of drug. Chemo consent will need to be obtained by [name], patient's legal guardian. I will more than likely fax the consent sheet along with Xeloda instructions to [facility]. I have faxed Xeloda instructions to [facility] along with the appointment dates for patient. [employer] verbalized that they need to call us here @ [facility] if there is anything out of the ordinary with patient. We reviewed side effects associated with Xeloda. She was instructed that we are open M-F and that if something occurred over the weekend to call [facility] operator and ask them to page an oncologist @ [facility]. She said ok.

## 2013-02-23 NOTE — Patient Instructions (Addendum)
[facility]   CHEMOTHERAPY INSTRUCTIONS  Side Effects: Xeloda - diarrhea, hand-foot syndrome (hands/feet can get red/tender/and skin can peel). Avoid friction and hot environments on hands/feet. Wear cotton socks. Lotion twice a day to hands/fingers/feet/toes with Udder cream. Mucositis (inflammation of any mucosal membrane can develop -this can occur in the throat/mouth). Mouth sores, nausea/vomiting, anemia, fatigue can also develop.   Xeloda usually comes with a teaching packet from the mail order/specialty pharmacy that will supply you with the drug that is really informative about diarrhea and hand-foot syndrome. No pregnant, child bearing age people, or animals should come into contact with this drug. Even though this is in pill form - it is still very powerful!!! If you should be instructed to discontinue taking this drug, bring the drug into the Cancer Clinic and we will dispose of it in a safe manner. Chemotherapy is a biohazard and must be disposed of properly. Do not touch this pill without gloves on.   Do not put this pill in with the rest of your pills in a pill box. Keep them in a separate pill box. If you are taking Coumadin while taking this drug, we will need to monitor your PT/INR (lab) closely because Xeloda can increase the effectiveness of Coumadin.   You should take this medication 30 minutes after eating your am and pm meals. Take 2 (500mg ) tablets 30 minutes after am and pm meals. The reason for taking this AFTER meals is to slow down the absorption rate of the Xeloda. If taken on an empty stomach it can be absorbed too quickly/readily and cause major side effects/adverse reactions!   POTENTIAL SIDE EFFECTS OF TREATMENT: Increased Susceptibility to Infection/Bone Marrow Suppression, Nausea/Vomiting/Diarrhea/Abdominal Cramping, Hair Thinning, Changes in Character of Skin and Nails (dry/brittle), Sun Sensitivity and Mouth Sores   EDUCATIONAL  MATERIALS GIVEN AND REVIEWED: Specific Instructions Sheets Xeloda, Zofran, Lomotil   SELF CARE ACTIVITIES WHILE ON CHEMOTHERAPY: Try to drink 60+ ounces of fluid daily. No aspirin or other medications unless approved by your oncologist. Eat foods that are light and easy to digest., Eat foods at cold or room temperature., No fried, fatty, or spicy foods immediately before or after treatment., Have teeth cleaned professionally before starting treatment. Keep dentures and partial plates clean., Use soft toothbrush and do not use mouthwashes that contain alcohol. Biotene is a good mouthwash that is available at most pharmacies or may be ordered by calling [phone]., Use warm salt water gargles (1 teaspoon salt per 1 quart warm water) before and after meals and at bedtime. Or you may rinse with 2 tablespoons of three -percent hydrogen peroxide mixed in eight ounces of water., Always use sunscreen with SPF (Sun Protection Factor) of 30 or higher., Use your nausea medication as directed to prevent nausea., Use your stool softener or laxative as directed to prevent constipation. and Use your anti-diarrheal medication as directed to stop diarrhea.  Please wash your hands for at least 30 seconds using warm soapy water. Handwashing is the #1 way to prevent the spread of germs. Stay away from sick people or people who are getting over a cold. If you develop respiratory systems such as green/yellow mucus production or productive cough or persistent cough let us know and we will see if you need an antibiotic.  All foods need to be cooked thoroughly. No raw foods. No medium or undercooked meats, eggs. If your food is cooked medium well, it does not need to be hot  pink or saturated with bloody liquid at all. Vegetables and fruits need to be washed/rinsed under the faucet with a dish detergent before being consumed. You can eat raw fruits and vegetables unless we tell you otherwise but it would be best if you cooked  them or bought frozen. Do not eat off of salad bars or hot bars unless you really trust the cleanliness of the restaurant. If you need dental work, please let us know before you go for your appointment so that we can coordinate the best possible time for you in regards to your chemo regimen. You need to also let your dentist know that you are actively taking chemo. We may need to do labs prior to your dental appointment. We also want your bowels moving at least every other day. If this is not happening, we need to know so that we can get you on a bowel regimen to help you go.     MEDICATIONS: You have been given prescriptions for the following medications:   Xeloda 500mg  tablets. Take 2 tablets (1000mg  total) by mouth 30 minutes after am and pm meals x 14 days in a row. Then do not give Xeloda for 7 days in a row. We call this a rest period. After the 7 days of rest, patient will need to resume the medication again as noted above.   Zofran 8mg  tablet. May take 1 tablet every 8 hours as needed for nausea/vomiting.   Lomotil. May take 1 tablet four times a day as needed for diarrhea/loose stools. If patient develops more than 2 loose stools (and this is not usual for the patient) please begin Lomotil until loose stools decrease or stop. If patient does not stop having loose stools after 24 hours or sooner depending on the severity of the loose stools - please call [facility] @ [phone] and ask to speak to a nurse. We will either instruct you on taking Lomotil more frequently or have you stop taking Xeloda.      SYMPTOMS TO REPORT AS SOON AS POSSIBLE AFTER TREATMENT:  FEVER GREATER THAN 100.5 F  CHILLS WITH OR WITHOUT FEVER  NAUSEA AND VOMITING THAT IS NOT CONTROLLED WITH YOUR NAUSEA MEDICATION  UNUSUAL SHORTNESS OF BREATH  UNUSUAL BRUISING OR BLEEDING  TENDERNESS IN MOUTH AND THROAT WITH OR WITHOUT PRESENCE OF ULCERS  URINARY PROBLEMS  BOWEL PROBLEMS  UNUSUAL RASH     Wear comfortable clothing and clothing appropriate for easy access to any Portacath or PICC line. Let us know if there is anything that we can do to make your therapy better!      I have been informed and understand all of the instructions given to me and have received a copy. I have been instructed to call the clinic [phone] or my family physician as soon as possible for continued medical care, if indicated. I do not have any more questions at this time but understand that I may call the Cancer Center or the Patient Navigator at [phone] during office hours should I have questions or need assistance in obtaining follow-up care.      _________________________________________      _______________     __________ Signature of Patient or Authorized Representative        Date                            Time      _________________________________________ Nurse's  Signature

## 2013-02-25 ENCOUNTER — Encounter (HOSPITAL_COMMUNITY): Payer: Medicare Other | Attending: Hematology and Oncology

## 2013-02-25 DIAGNOSIS — C182 Malignant neoplasm of ascending colon: Secondary | ICD-10-CM | POA: Insufficient documentation

## 2013-02-25 DIAGNOSIS — D500 Iron deficiency anemia secondary to blood loss (chronic): Secondary | ICD-10-CM | POA: Insufficient documentation

## 2013-02-25 DIAGNOSIS — C186 Malignant neoplasm of descending colon: Secondary | ICD-10-CM

## 2013-02-25 LAB — FERRITIN: Ferritin: 6 ng/mL — ABNORMAL LOW (ref 22–322)

## 2013-02-25 LAB — RETICULOCYTES
RBC.: 4.99 MIL/uL (ref 4.22–5.81)
Retic Ct Pct: 0.7 % (ref 0.4–3.1)

## 2013-02-25 LAB — CBC WITH DIFFERENTIAL/PLATELET
Basophils Absolute: 0 10*3/uL (ref 0.0–0.1)
Eosinophils Relative: 0 % (ref 0–5)
Lymphocytes Relative: 25 % (ref 12–46)
Lymphs Abs: 1.7 10*3/uL (ref 0.7–4.0)
Neutro Abs: 4.7 10*3/uL (ref 1.7–7.7)
Neutrophils Relative %: 69 % (ref 43–77)
Platelets: 273 10*3/uL (ref 150–400)
RBC: 4.99 MIL/uL (ref 4.22–5.81)
RDW: 21.9 % — ABNORMAL HIGH (ref 11.5–15.5)
WBC: 6.8 10*3/uL (ref 4.0–10.5)

## 2013-02-25 LAB — LACTATE DEHYDROGENASE: LDH: 179 U/L (ref 94–250)

## 2013-02-25 LAB — COMPREHENSIVE METABOLIC PANEL
AST: 19 U/L (ref 0–37)
CO2: 24 mEq/L (ref 19–32)
Calcium: 9.2 mg/dL (ref 8.4–10.5)
Creatinine, Ser: 0.74 mg/dL (ref 0.50–1.35)
GFR calc Af Amer: >90 mL/min (ref >90)
GFR calc non Af Amer: >90 mL/min (ref >90)
Sodium: 136 mEq/L (ref 135–145)
Total Protein: 7.8 g/dL (ref 6.0–8.3)

## 2013-02-25 NOTE — Progress Notes (Signed)
Labs drawn today for cbc/diff,retic,ldh,cmp,cea,ferr

## 2013-03-04 ENCOUNTER — Other Ambulatory Visit (HOSPITAL_COMMUNITY): Payer: Self-pay | Admitting: Hematology and Oncology

## 2013-03-04 MED ORDER — CAPECITABINE 500 MG PO TABS: 1000.0000 mg | ORAL_TABLET | Freq: Two times a day (BID) | ORAL | Status: DC

## 2013-03-08 ENCOUNTER — Telehealth (HOSPITAL_COMMUNITY): Payer: Self-pay | Admitting: *Deleted

## 2013-03-08 NOTE — Telephone Encounter (Signed)
Voice message left with Kara Mead at [facility]. I let her know that we were going to send a small packet of information to [employer] via fax. Once signed, the packet will be sent to the company that makes Xeloda in hopes of getting the drug to the patient. Patient's specialty pharmacy will not fill the RX of Xeloda because it falls under Medicare Part B and they only deal with Part D. Patient can get the drug if he pays $6,000 up front. He could then potentially be reimbursed by his insurance. However, patient can not afford $6,000 out of pocket.  I sent a fax to [name] last week regarding a consent form that I needed to have signed for the patient to take Xeloda ad have not received that back yet. We @ the Cancer Center are at a stand still until we get the paper work back from [employer]. Artelia Laroche our financial counselor is getting the paperwork together to be faxed to [facility].

## 2013-03-11 ENCOUNTER — Encounter: Payer: Self-pay | Admitting: Hematology and Oncology

## 2013-03-16 ENCOUNTER — Other Ambulatory Visit (HOSPITAL_COMMUNITY): Payer: Self-pay | Admitting: Hematology and Oncology

## 2013-03-16 MED ORDER — CAPECITABINE 500 MG PO TABS: 1000.0000 mg | ORAL_TABLET | Freq: Two times a day (BID) | ORAL | Status: DC

## 2013-03-21 ENCOUNTER — Encounter: Payer: Self-pay | Admitting: Family Medicine

## 2013-03-21 ENCOUNTER — Ambulatory Visit (INDEPENDENT_AMBULATORY_CARE_PROVIDER_SITE_OTHER): Payer: Medicare Other | Admitting: Family Medicine

## 2013-03-21 VITALS — BP 134/86 | HR 78 | Resp 18 | Ht 62.75 in | Wt 145.0 lb

## 2013-03-21 DIAGNOSIS — F29 Unspecified psychosis not due to a substance or known physiological condition: Secondary | ICD-10-CM

## 2013-03-21 DIAGNOSIS — C182 Malignant neoplasm of ascending colon: Secondary | ICD-10-CM

## 2013-03-21 DIAGNOSIS — E119 Type 2 diabetes mellitus without complications: Secondary | ICD-10-CM

## 2013-03-21 DIAGNOSIS — E1169 Type 2 diabetes mellitus with other specified complication: Secondary | ICD-10-CM

## 2013-03-21 DIAGNOSIS — F79 Unspecified intellectual disabilities: Secondary | ICD-10-CM

## 2013-03-21 DIAGNOSIS — E785 Hyperlipidemia, unspecified: Secondary | ICD-10-CM

## 2013-03-21 DIAGNOSIS — I10 Essential (primary) hypertension: Secondary | ICD-10-CM

## 2013-03-21 DIAGNOSIS — F329 Major depressive disorder, single episode, unspecified: Secondary | ICD-10-CM

## 2013-03-21 DIAGNOSIS — E669 Obesity, unspecified: Secondary | ICD-10-CM

## 2013-03-21 LAB — HEMOGLOBIN A1C: Hgb A1c MFr Bld: 5.6 % (ref <5.7)

## 2013-03-21 LAB — COMPLETE METABOLIC PANEL WITH GFR
ALT: 10 U/L (ref 0–53)
Albumin: 4.3 g/dL (ref 3.5–5.2)
Alkaline Phosphatase: 65 U/L (ref 39–117)
CO2: 26 mEq/L (ref 19–32)
Potassium: 3.6 mEq/L (ref 3.5–5.3)
Sodium: 141 mEq/L (ref 135–145)
Total Bilirubin: 0.7 mg/dL (ref 0.3–1.2)
Total Protein: 7.4 g/dL (ref 6.0–8.3)

## 2013-03-21 LAB — LIPID PANEL
HDL: 70 mg/dL (ref >39)
LDL Cholesterol: 64 mg/dL (ref 0–99)
Total CHOL/HDL Ratio: 2.1 Ratio

## 2013-03-21 NOTE — Patient Instructions (Addendum)
F/u in 4 month, call if you need me before  Lipid, cmp and EGFr, hBA1c today please   Pt needs to be evaluated by mental health due to 1 week h/o mood instability, aggressive behavior , eloping, excessive crying, this is extremely important   Please continue to follow through on obtaining the medication to treat his cancer, the center is waiting for information from his guardian to do this, so you shouild give her a call about this

## 2013-03-21 NOTE — Progress Notes (Signed)
   Subjective:    Patient ID: Xavier Tanner, male    DOB: 1951-03-31, 62 y.o.   MRN: [id number]  HPI Pt in for follow  Of chronic conditons. Still has not started chemotherapy for colon cancer which though contained and totally removed, is recommended to have this treatment which is very expensive, and pt is a ward of the state, so his socialworker is respoonsible for following through and obtaining the medication needed. Unfortunately , the report is that Orlondo' overall condition has deteriorated, appetitie is poor , he is losing weight and is demonstrating significant behavioral issues, trying to elope and unstable mood, need spsych eval for med adjustment based on the report. No perceived physical threat to himself but noted to become violent at times   Review of Systems See HPI. History is from caregiver, as pt is incapable of providing a reliable history Denies recent fever or chills. Denies sinus pressure, nasal congestion, ear pain or sore throat. Denies chest congestion, productive cough or wheezing. Denies chest pains, palpitations and leg swelling Denies abdominal pain, nausea, vomiting,diarrhea or constipation.   Denies joint pain, and limitation in mobility. Denies headaches, seizures, numbness, or tingling.  Denies skin break down or rash.        Objective:   Physical Exam Patient alert  and in no cardiopulmonary distress.  HEENT: No facial asymmetry, EOMI, no sinus tenderness,  oropharynx pink and moist.  Neck supple no adenopathy.  Chest: Clear to auscultation bilaterally.  CVS: S1, S2 no murmurs, no S3.  ABD: Soft non tender. Bowel sounds normal.  Ext: No edema  MS: Adequate ROM spine, shoulders, hips and knees.  Skin: Intact, no ulcerations or rash noted.  Psych: Good eye contact, blunted  affect. 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        Assessment & Plan:

## 2013-03-22 ENCOUNTER — Telehealth (HOSPITAL_COMMUNITY): Payer: Self-pay | Admitting: Hematology and Oncology

## 2013-03-23 ENCOUNTER — Other Ambulatory Visit: Payer: Self-pay | Admitting: Family Medicine

## 2013-03-27 NOTE — Assessment & Plan Note (Signed)
Controlled, no change in medication Hyperlipidemia:Low fat diet discussed and encouraged.  \ 

## 2013-03-27 NOTE — Assessment & Plan Note (Signed)
Uncontrolled with recent behavioral issues, importance of urgent psych eval stressed based on concerns voiced by accompanying staff [facility] of group home in which he lives

## 2013-03-27 NOTE — Assessment & Plan Note (Signed)
successful re section of margins in 2014, still awaiting medication for furhter treatment, his guardian is looking about this, the treatment is extremely expensive

## 2013-03-27 NOTE — Assessment & Plan Note (Signed)
Deterioration in status , psych eval needed asap

## 2013-03-27 NOTE — Assessment & Plan Note (Signed)
Controlled, no change in medication  

## 2013-03-27 NOTE — Assessment & Plan Note (Signed)
Unable to live independently and limited in his ability to express himself verbally

## 2013-03-27 NOTE — Assessment & Plan Note (Signed)
Diet controlled now normoglycemic . Continue to monitor HBa1C periodically

## 2013-03-28 ENCOUNTER — Encounter (HOSPITAL_BASED_OUTPATIENT_CLINIC_OR_DEPARTMENT_OTHER): Payer: Medicare Other

## 2013-03-28 ENCOUNTER — Encounter (HOSPITAL_COMMUNITY): Payer: Medicare Other | Attending: Hematology and Oncology

## 2013-03-28 ENCOUNTER — Encounter (HOSPITAL_COMMUNITY): Payer: Self-pay

## 2013-03-28 VITALS — BP 147/92 | HR 77 | Temp 98.2°F | Resp 16 | Wt 147.0 lb

## 2013-03-28 DIAGNOSIS — C186 Malignant neoplasm of descending colon: Secondary | ICD-10-CM

## 2013-03-28 DIAGNOSIS — Z8546 Personal history of malignant neoplasm of prostate: Secondary | ICD-10-CM

## 2013-03-28 DIAGNOSIS — I10 Essential (primary) hypertension: Secondary | ICD-10-CM

## 2013-03-28 DIAGNOSIS — F29 Unspecified psychosis not due to a substance or known physiological condition: Secondary | ICD-10-CM

## 2013-03-28 DIAGNOSIS — C182 Malignant neoplasm of ascending colon: Secondary | ICD-10-CM

## 2013-03-28 DIAGNOSIS — B351 Tinea unguium: Secondary | ICD-10-CM

## 2013-03-28 LAB — COMPREHENSIVE METABOLIC PANEL
ALT: 7 U/L (ref 0–53)
Albumin: 3.6 g/dL (ref 3.5–5.2)
Alkaline Phosphatase: 63 U/L (ref 39–117)
BUN: 15 mg/dL (ref 6–23)
Potassium: 3.6 mEq/L (ref 3.5–5.1)
Sodium: 136 mEq/L (ref 135–145)
Total Protein: 7.7 g/dL (ref 6.0–8.3)

## 2013-03-28 LAB — CBC WITH DIFFERENTIAL/PLATELET
Basophils Absolute: 0 10*3/uL (ref 0.0–0.1)
Basophils Relative: 0 % (ref 0–1)
Eosinophils Absolute: 0 10*3/uL (ref 0.0–0.7)
MCH: 21 pg — ABNORMAL LOW (ref 26.0–34.0)
MCHC: 31.4 g/dL (ref 30.0–36.0)
Neutrophils Relative %: 63 % (ref 43–77)
Platelets: 287 10*3/uL (ref 150–400)
RDW: 18.9 % — ABNORMAL HIGH (ref 11.5–15.5)

## 2013-03-28 NOTE — Patient Instructions (Signed)
[facility] Discharge Instructions  RECOMMENDATIONS MADE BY THE CONSULTANT AND ANY TEST RESULTS WILL BE SENT TO YOUR REFERRING PHYSICIAN.  Continue Xeloda as prescribed. Report any side effects of medication to office if needed. Continue monthly lab work. MD appointmnent again in 2 months.  Thank you for choosing [facility] to provide your oncology and hematology care.  To afford each patient quality time with our providers, please arrive at least 15 minutes before your scheduled appointment time.  With your help, our goal is to use those 15 minutes to complete the necessary work-up to ensure our physicians have the information they need to help with your evaluation and healthcare recommendations.    Effective January 1st, 2014, we ask that you re-schedule your appointment with our physicians should you arrive 10 or more minutes late for your appointment.  We strive to give you quality time with our providers, and arriving late affects you and other patients whose appointments are after yours.    Again, thank you for choosing [facility].  Our hope is that these requests will decrease the amount of time that you wait before being seen by our physicians.       _____________________________________________________________  Should you have questions after your visit to [facility], please contact our office at [phone] between the hours of 8:30 a.m. and 5:00 p.m.  Voicemails left after 4:30 p.m. will not be returned until the following business day.  For prescription refill requests, have your pharmacy contact our office with your prescription refill request.

## 2013-03-28 NOTE — Progress Notes (Signed)
[facility]  OFFICE PROGRESS NOTE  Syliva Overman, MD [address] [place] [address]  DIAGNOSIS: Colon cancer, ascending  ONYCHOMYCOSIS, TOENAILS  Unspecified psychosis  HYPERTENSION  Chief Complaint  Patient presents with  . Colon Cancer    CURRENT THERAPY: Xeloda 1000 mg twice a day for 14 days out of every 21 started onl 03/24/2013.  INTERVAL HISTORY: Xavier Tanner 62 y.o. male returns for followup a receiving adjuvant Xeloda for stage II colon cancer microsatellite stable.  Since starting on a medication, he has had no problems with diarrhea or symptoms of hand-foot syndrome. He had been more belligerent recently was started on new psychotropic agents consequently with some dizziness. He denies abdominal pain, nausea, vomiting, sore mouth, burning sensation in his palms or soles, or loose bowel movements. He denies any dysuria, urinary hesitancy, skin rash, headache, or seizures does suffer with occasional urinary incontinence.  MEDICAL HISTORY: Past Medical History  Diagnosis Date  . Urinary incontinence   . Psychotic disorder   . Diabetes mellitus, type 2   . Hypertension   . Bilateral cataracts 2011  . Vitamin D deficiency   . Mental retardation   . Depression   . Microcytic anemia 12/03/2012  . HOH (hard of hearing)   . Prostate cancer   . 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    INTERIM HISTORY: has ONYCHOMYCOSIS, TOENAILS; Unspecified vitamin D deficiency; HYPERLIPIDEMIA; Unspecified psychosis; HYPERTENSION; FATIGUE; URINARY INCONTINENCE; TINEA CRURIS; Tremor; Mental retardation; Hypokalemia; Microcytic anemia; Colon cancer, ascending; Type 2 diabetes, diet controlled; Depression; Anemia due to chronic blood loss; Dermatomycosis; and Routine general medical examination at a health care facility on his problem list.    ALLERGIES:  is allergic to ace inhibitors.  MEDICATIONS: has a  current medication list which includes the following prescription(s): alum & mag hydroxide-simeth, bisacodyl, capecitabine, diphenhydramine, diphenoxylate-atropine, fluticasone, guaifenesin, hydrochlorothiazide, hydrocodone-acetaminophen, hydrocortisone cream, loperamide, magnesium hydroxide, memantine, omeprazole, ondansetron, pravastatin, solifenacin, tamsulosin, and terbinafine.  SURGICAL HISTORY:  Past Surgical History  Procedure Laterality Date  . Hemorroidectomy  2002  . Transurethral resection of prostate  July 09, 2007  . Colonoscopy N/A 12/04/2012    Procedure: COLONOSCOPY;  Surgeon: Corbin Ade, MD;  Location: [facility];  Service: Endoscopy;  Laterality: N/A;  . Esophagogastroduodenoscopy N/A 12/04/2012    Procedure: ESOPHAGOGASTRODUODENOSCOPY (EGD);  Surgeon: Corbin Ade, MD;  Location: [facility];  Service: Endoscopy;  Laterality: N/A;  . Colon resection N/A 12/15/2012    Procedure: HAND ASSISTED LAPAROSCOPIC PARTIAL COLECTOMY;  Surgeon: Fabio Bering, MD;  Location: [facility];  Service: General;  Laterality: N/A;  . Colon resection N/A 12/15/2012    Procedure: COLON RESECTION;  Surgeon: Fabio Bering, MD;  Location: [facility];  Service: General;  Laterality: N/A;    FAMILY HISTORY: family history includes Diabetes in his mother; Hypertension in his mother; Mental illness in his mother; Stroke in his father.  SOCIAL HISTORY:  reports that he has never smoked. He does not have any smokeless tobacco history on file. He reports that he does not drink alcohol or use illicit drugs.  REVIEW OF SYSTEMS:  Other than that discussed above is noncontributory.  PHYSICAL EXAMINATION: ECOG PERFORMANCE STATUS: 1 - Symptomatic but completely ambulatory  Blood pressure 147/92, pulse 77, temperature 98.2 F (36.8 C), temperature source Oral, resp. rate 16, weight 147 lb (66.679 kg).  GENERAL:alert, no distress and comfortable. No  alopecia. SKIN: skin color, texture, turgor are normal,  no rashes or significant lesions EYES: PERLA; Conjunctiva are pink and non-injected, sclera clear OROPHARYNX:no exudate, no erythema on lips, buccal mucosa, or tongue. NECK: supple, thyroid normal size, non-tender, without nodularity. No masses CHEST: Normal AP diameter with no gynecomastia. LYMPH:  no palpable lymphadenopathy in the cervical, axillary or inguinal LUNGS: clear to auscultation and percussion with normal breathing effort HEART: regular rate & rhythm and no murmurs. ABDOMEN:abdomen soft, non-tender and normal bowel sounds. Well-healed surgical scar with no hepatosplenomegaly, ascites, or CVA tenderness. MUSCULOSKELETAL:no cyanosis of digits and no clubbing. Range of motion normal.  NEURO: alert & oriented x 3 with fluent speech, no focal motor/sensory deficits   LABORATORY DATA: Infusion on 03/28/2013  Component Date Value Range Status  . WBC 03/28/2013 7.4  4.0 - 10.5 K/uL Final  . RBC 03/28/2013 5.09  4.22 - 5.81 MIL/uL Final  . Hemoglobin 03/28/2013 10.7* 13.0 - 17.0 g/dL Final  . HCT 87/56/4332 34.1* 39.0 - 52.0 % Final  . MCV 03/28/2013 67.0* 78.0 - 100.0 fL Final  . MCH 03/28/2013 21.0* 26.0 - 34.0 pg Final  . MCHC 03/28/2013 31.4  30.0 - 36.0 g/dL Final  . RDW 95/18/8416 18.9* 11.5 - 15.5 % Final  . Platelets 03/28/2013 287  150 - 400 K/uL Final  . Neutrophils Relative % 03/28/2013 63  43 - 77 % Final  . Neutro Abs 03/28/2013 4.6  1.7 - 7.7 K/uL Final  . Lymphocytes Relative 03/28/2013 31  12 - 46 % Final  . Lymphs Abs 03/28/2013 2.3  0.7 - 4.0 K/uL Final  . Monocytes Relative 03/28/2013 5  3 - 12 % Final  . Monocytes Absolute 03/28/2013 0.4  0.1 - 1.0 K/uL Final  . Eosinophils Relative 03/28/2013 1  0 - 5 % Final  . Eosinophils Absolute 03/28/2013 0.0  0.0 - 0.7 K/uL Final  . Basophils Relative 03/28/2013 0  0 - 1 % Final  . Basophils Absolute 03/28/2013 0.0  0.0 - 0.1 K/uL Final  . Sodium 03/28/2013 136  135 - 145 mEq/L Final  . Potassium 03/28/2013 3.6  3.5  - 5.1 mEq/L Final  . Chloride 03/28/2013 102  96 - 112 mEq/L Final  . CO2 03/28/2013 24  19 - 32 mEq/L Final  . Glucose, Bld 03/28/2013 92  70 - 99 mg/dL Final  . BUN 60/63/0160 15  6 - 23 mg/dL Final  . Creatinine, Ser 03/28/2013 0.81  0.50 - 1.35 mg/dL Final  . Calcium 10/93/2355 9.1  8.4 - 10.5 mg/dL Final  . Total Protein 03/28/2013 7.7  6.0 - 8.3 g/dL Final  . Albumin 73/22/0254 3.6  3.5 - 5.2 g/dL Final  . AST 27/09/2374 17  0 - 37 U/L Final  . ALT 03/28/2013 7  0 - 53 U/L Final  . Alkaline Phosphatase 03/28/2013 63  39 - 117 U/L Final  . Total Bilirubin 03/28/2013 0.7  0.3 - 1.2 mg/dL Final  . GFR calc non Af Amer 03/28/2013 >90  >90 mL/min Final  . GFR calc Af Amer 03/28/2013 >90  >90 mL/min Final   Comment: (NOTE)                          The eGFR has been calculated using the CKD EPI equation.                          This calculation  has not been validated in all clinical situations.                          eGFR's persistently <90 mL/min signify possible Chronic Kidney                          Disease.  Office Visit on 03/21/2013  Component Date Value Range Status  . Sodium 03/21/2013 141  135 - 145 mEq/L Final  . Potassium 03/21/2013 3.6  3.5 - 5.3 mEq/L Final  . Chloride 03/21/2013 105  96 - 112 mEq/L Final  . CO2 03/21/2013 26  19 - 32 mEq/L Final  . Glucose, Bld 03/21/2013 94  70 - 99 mg/dL Final  . BUN 16/01/9603 17  6 - 23 mg/dL Final  . Creat 54/12/8117 0.75  0.50 - 1.35 mg/dL Final  . Total Bilirubin 03/21/2013 0.7  0.3 - 1.2 mg/dL Final  . Alkaline Phosphatase 03/21/2013 65  39 - 117 U/L Final  . AST 03/21/2013 18  0 - 37 U/L Final  . ALT 03/21/2013 10  0 - 53 U/L Final  . Total Protein 03/21/2013 7.4  6.0 - 8.3 g/dL Final  . Albumin 14/78/2956 4.3  3.5 - 5.2 g/dL Final  . Calcium 21/30/8657 9.3  8.4 - 10.5 mg/dL Final  . GFR, Est African American 03/21/2013 >89   Final  . GFR, Est Non African American 03/21/2013 >89   Final   Comment:                             The estimated GFR is a calculation valid for adults (>=24 years old)                          that uses the CKD-EPI algorithm to adjust for age and sex. It is                            not to be used for children, pregnant women, hospitalized patients,                             patients on dialysis, or with rapidly changing kidney function.                          According to the NKDEP, eGFR >89 is normal, 60-89 shows mild                          impairment, 30-59 shows moderate impairment, 15-29 shows severe                          impairment and <15 is ESRD.                             Marland Kitchen Cholesterol 03/21/2013 147  0 - 200 mg/dL Final   Comment: ATP III Classification:                                < 200        mg/dL  Desirable                               200 - 239     mg/dL        Borderline High                               >= 240        mg/dL        High                             . Triglycerides 03/21/2013 65  <150 mg/dL Final  . HDL 16/01/9603 70  >39 mg/dL Final  . Total CHOL/HDL Ratio 03/21/2013 2.1   Final  . VLDL 03/21/2013 13  0 - 40 mg/dL Final  . LDL Cholesterol 03/21/2013 64  0 - 99 mg/dL Final   Comment:                            Total Cholesterol/HDL Ratio:CHD Risk                                                 Coronary Heart Disease Risk Table                                                                 Men       Women                                   1/2 Average Risk              3.4        3.3                                       Average Risk              5.0        4.4                                    2X Average Risk              9.6        7.1                                    3X Average Risk             23.4       11.0                          Use the calculated  Patient Ratio above and the CHD Risk table                           to determine the patient's CHD Risk.                          ATP III Classification (LDL):                                 < 100        mg/dL         Optimal                               100 - 129     mg/dL         Near or Above Optimal                               130 - 159     mg/dL         Borderline High                               160 - 189     mg/dL         High                                > 190        mg/dL         Very High                             . Hemoglobin A1C 03/21/2013 5.6  <5.7 % Final   Comment:                                                                                                 According to the ADA Clinical Practice Recommendations for 2011, when                          HbA1c is used as a screening test:                                                       >=6.5%   Diagnostic of Diabetes Mellitus                                     (if abnormal result is confirmed)  5.7-6.4%   Increased risk of developing Diabetes Mellitus                                                     References:Diagnosis and Classification of Diabetes Mellitus,Diabetes                          Care,2011,34(Suppl 1):S62-S69 and Standards of Medical Care in                                  Diabetes - 2011,Diabetes Care,2011,34 (Suppl 1):S11-S61.                             . Mean Plasma Glucose 03/21/2013 114  <117 mg/dL Final    PATHOLOGY: Patient: CASHTON, HOSLEY Collected: 12/04/2012 Client: [facility]  Accession: [id number] Received: 12/06/2012 Eula Listen  DOB: 20-Jan-1951 Age: 62 Gender: M Reported: 12/07/2012 [address]  Patient Ph: [phone] MRN #: [id number] [place] [address]  Visit #: [id number] Chart #: Phone: [phone] Fax:  CC:  REPORT OF SURGICAL PATHOLOGY  FINAL DIAGNOSIS  Diagnosis  Colon, biopsy, lesion at ascending colon 10 cm from ileocecal valve  - POSITIVE FOR ADENOCARCINOMA.  Microscopic Comment  The findings are called to Eula Listen on 12/07/12. Dr. Frederica Kuster has seen this case  in consultation with  agreement. (RAH:caf 12/07/12)  Zandra Abts MD  Pathologist, Electronic    Colon, segmental resection for tumor, right colon  - INVASIVE ADENOCARCINOMA, INVADING THROUGH THE MUSCULARIS PROPRIA INTO  PERICOLONIC FATTY TISSUE.  - NO EVIDENCE OF ANGIOLYMPHATIC INVASION OR PERINEURAL INVASION IDENTIFIED.  - EIGHTEEN LYMPH NODES, NEGATIVE FOR METASTATIC CARCINOMA (0/18).  - RESECTION MARGINS ARE NEGATIVE FOR ATYPIA OR MALIGNANCY    MICROSATELLITE STABLE  ONCOTYPE DX COLON RECURRENCE SCORE 44--19% RECURRENCE RISK  AGGRESSIVE CHEMO-----12% RECURRENCE RISK  LESS AGGRESSIVE CHEMO------16% RECURRENCE RISK    Urinalysis    Component Value Date/Time   COLORURINE STRAW* 12/22/2011 2143   APPEARANCEUR CLEAR 12/22/2011 2143   LABSPEC <1.005* 12/22/2011 2143   PHURINE 6.0 12/22/2011 2143   GLUCOSEU NEGATIVE 12/22/2011 2143   HGBUR MODERATE* 12/22/2011 2143   BILIRUBINUR neg 07/21/2012 1544   BILIRUBINUR NEGATIVE 12/22/2011 2143   KETONESUR NEGATIVE 12/22/2011 2143   PROTEINUR NEGATIVE 12/22/2011 2143   UROBILINOGEN 0.2 07/21/2012 1544   UROBILINOGEN 0.2 12/22/2011 2143   NITRITE neg 07/21/2012 1544   NITRITE NEGATIVE 12/22/2011 2143   LEUKOCYTESUR Trace 07/21/2012 1544    RADIOGRAPHIC STUDIES: No results found.  ASSESSMENT:  #1. 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 #2.Mental retardation with mild dementia, functioning well in a group home.  #3. History of cancer of the prostate, status post TURP.  #4. Urinary incontinence.  #5. Dermatophytosis of the groin and perianal region, resolved.   PLAN:  #1. Complete 14 days of Xeloda therapy which was started on  03/24/2013. His caregiver was instructed  to call should he develop diarrhea or hand-foot syndrome. If so, the next dose of medication should not be given. #2. Continue current psychotropic medication. #3. Monthly lab work with repeat office visit in 2 months.   All questions were answered. The patient knows to call the clinic with any problems, questions or concerns. We can certainly see the patient much sooner if necessary.   I spent 25 minutes counseling the patient face to face. The total time spent in the appointment was 30 minutes.    Maurilio Lovely, MD 03/28/2013 12:46 PM

## 2013-03-28 NOTE — Progress Notes (Signed)
Labs drawn today for cbc/diff,cmp 

## 2013-04-25 ENCOUNTER — Encounter (HOSPITAL_COMMUNITY): Payer: Medicare Other | Attending: Hematology and Oncology

## 2013-04-25 ENCOUNTER — Other Ambulatory Visit (HOSPITAL_COMMUNITY): Payer: Medicare Other

## 2013-04-25 ENCOUNTER — Ambulatory Visit (HOSPITAL_COMMUNITY): Payer: Medicare Other

## 2013-04-25 DIAGNOSIS — C182 Malignant neoplasm of ascending colon: Secondary | ICD-10-CM | POA: Insufficient documentation

## 2013-04-25 LAB — COMPREHENSIVE METABOLIC PANEL
ALBUMIN: 3.8 g/dL (ref 3.5–5.2)
ALK PHOS: 65 U/L (ref 39–117)
ALT: 11 U/L (ref 0–53)
AST: 18 U/L (ref 0–37)
BILIRUBIN TOTAL: 0.9 mg/dL (ref 0.3–1.2)
BUN: 18 mg/dL (ref 6–23)
CHLORIDE: 102 meq/L (ref 96–112)
CO2: 25 mEq/L (ref 19–32)
Calcium: 9.3 mg/dL (ref 8.4–10.5)
Creatinine, Ser: 0.88 mg/dL (ref 0.50–1.35)
GFR calc Af Amer: >90 mL/min (ref >90)
GFR calc non Af Amer: 89 mL/min — ABNORMAL LOW (ref >90)
Glucose, Bld: 106 mg/dL — ABNORMAL HIGH (ref 70–99)
POTASSIUM: 4.2 meq/L (ref 3.7–5.3)
SODIUM: 140 meq/L (ref 137–147)
TOTAL PROTEIN: 7.4 g/dL (ref 6.0–8.3)

## 2013-04-25 LAB — CBC WITH DIFFERENTIAL/PLATELET
BASOS PCT: 0 % (ref 0–1)
Basophils Absolute: 0 10*3/uL (ref 0.0–0.1)
EOS ABS: 0 10*3/uL (ref 0.0–0.7)
Eosinophils Relative: 0 % (ref 0–5)
HCT: 32.7 % — ABNORMAL LOW (ref 39.0–52.0)
HEMOGLOBIN: 10.7 g/dL — AB (ref 13.0–17.0)
Lymphocytes Relative: 34 % (ref 12–46)
Lymphs Abs: 2.1 10*3/uL (ref 0.7–4.0)
MCH: 22.3 pg — AB (ref 26.0–34.0)
MCHC: 32.7 g/dL (ref 30.0–36.0)
MCV: 68.1 fL — ABNORMAL LOW (ref 78.0–100.0)
MONOS PCT: 6 % (ref 3–12)
Monocytes Absolute: 0.4 10*3/uL (ref 0.1–1.0)
NEUTROS ABS: 3.7 10*3/uL (ref 1.7–7.7)
NEUTROS PCT: 60 % (ref 43–77)
PLATELETS: 241 10*3/uL (ref 150–400)
RBC: 4.8 MIL/uL (ref 4.22–5.81)
RDW: 20 % — ABNORMAL HIGH (ref 11.5–15.5)
WBC: 6.2 10*3/uL (ref 4.0–10.5)

## 2013-04-25 NOTE — Progress Notes (Signed)
Labs drawn today for cbc/diff,cmp 

## 2013-05-13 ENCOUNTER — Telehealth: Payer: Self-pay | Admitting: Family Medicine

## 2013-05-13 MED ORDER — BENZONATATE 100 MG PO CAPS: 100.0000 mg | ORAL_CAPSULE | Freq: Two times a day (BID) | ORAL | Status: DC | PRN

## 2013-05-13 NOTE — Telephone Encounter (Signed)
Facility aware.

## 2013-05-13 NOTE — Addendum Note (Signed)
Addended by: Tula Nakayama E on: 05/13/2013 12:16 PM   Modules accepted: Orders

## 2013-05-13 NOTE — Telephone Encounter (Signed)
pls let facility know tesaalon perles has been sent in

## 2013-05-13 NOTE — Telephone Encounter (Signed)
2 days, patient has dry cough, worse in the afternoon. I advised robitussin and she said that was fine but it had to be called in to the pharmacy to be put on his standing orders for prn use. Or if you prefer to call in something else, that is fine too.

## 2013-05-23 ENCOUNTER — Encounter (HOSPITAL_BASED_OUTPATIENT_CLINIC_OR_DEPARTMENT_OTHER): Payer: Medicare Other

## 2013-05-23 ENCOUNTER — Encounter (HOSPITAL_COMMUNITY): Payer: Medicare Other | Attending: Hematology and Oncology

## 2013-05-23 ENCOUNTER — Encounter (HOSPITAL_COMMUNITY): Payer: Self-pay

## 2013-05-23 ENCOUNTER — Ambulatory Visit (HOSPITAL_COMMUNITY): Payer: Medicare Other

## 2013-05-23 VITALS — BP 113/79 | HR 73 | Temp 97.7°F | Resp 18 | Wt 144.2 lb

## 2013-05-23 DIAGNOSIS — C182 Malignant neoplasm of ascending colon: Secondary | ICD-10-CM

## 2013-05-23 DIAGNOSIS — F79 Unspecified intellectual disabilities: Secondary | ICD-10-CM

## 2013-05-23 DIAGNOSIS — L270 Generalized skin eruption due to drugs and medicaments taken internally: Secondary | ICD-10-CM

## 2013-05-23 DIAGNOSIS — Z8546 Personal history of malignant neoplasm of prostate: Secondary | ICD-10-CM

## 2013-05-23 DIAGNOSIS — D500 Iron deficiency anemia secondary to blood loss (chronic): Secondary | ICD-10-CM

## 2013-05-23 LAB — CBC WITH DIFFERENTIAL/PLATELET
BASOS ABS: 0 10*3/uL (ref 0.0–0.1)
Basophils Relative: 1 % (ref 0–1)
EOS ABS: 0 10*3/uL (ref 0.0–0.7)
Eosinophils Relative: 0 % (ref 0–5)
HCT: 31.8 % — ABNORMAL LOW (ref 39.0–52.0)
Hemoglobin: 10.3 g/dL — ABNORMAL LOW (ref 13.0–17.0)
Lymphocytes Relative: 34 % (ref 12–46)
Lymphs Abs: 2.1 10*3/uL (ref 0.7–4.0)
MCH: 22.8 pg — AB (ref 26.0–34.0)
MCHC: 32.4 g/dL (ref 30.0–36.0)
MCV: 70.5 fL — ABNORMAL LOW (ref 78.0–100.0)
Monocytes Absolute: 0.4 10*3/uL (ref 0.1–1.0)
Monocytes Relative: 6 % (ref 3–12)
Neutro Abs: 3.6 10*3/uL (ref 1.7–7.7)
Neutrophils Relative %: 59 % (ref 43–77)
PLATELETS: 234 10*3/uL (ref 150–400)
RBC: 4.51 MIL/uL (ref 4.22–5.81)
RDW: 23.9 % — AB (ref 11.5–15.5)
WBC: 6.1 10*3/uL (ref 4.0–10.5)

## 2013-05-23 LAB — COMPREHENSIVE METABOLIC PANEL
ALBUMIN: 3.9 g/dL (ref 3.5–5.2)
ALK PHOS: 69 U/L (ref 39–117)
ALT: 7 U/L (ref 0–53)
AST: 16 U/L (ref 0–37)
BUN: 19 mg/dL (ref 6–23)
CALCIUM: 9.1 mg/dL (ref 8.4–10.5)
CO2: 26 mEq/L (ref 19–32)
Chloride: 102 mEq/L (ref 96–112)
Creatinine, Ser: 0.77 mg/dL (ref 0.50–1.35)
GFR calc non Af Amer: >90 mL/min (ref >90)
GLUCOSE: 90 mg/dL (ref 70–99)
POTASSIUM: 4.2 meq/L (ref 3.7–5.3)
SODIUM: 139 meq/L (ref 137–147)
TOTAL PROTEIN: 7.7 g/dL (ref 6.0–8.3)
Total Bilirubin: 1 mg/dL (ref 0.3–1.2)

## 2013-05-23 NOTE — Progress Notes (Signed)
Labs drawn today for cea,cbc/diff,cmp

## 2013-05-23 NOTE — Patient Instructions (Addendum)
[facility] Discharge Instructions  RECOMMENDATIONS MADE BY THE CONSULTANT AND ANY TEST RESULTS WILL BE SENT TO YOUR REFERRING PHYSICIAN.  EXAM FINDINGS BY THE PHYSICIAN TODAY AND SIGNS OR SYMPTOMS TO REPORT TO CLINIC OR PRIMARY PHYSICIAN: Exam and findings as discussed by Dr. Barnet Glasgow.  Will change regimen for Capecitabine (xeloda) due to the cracking and tenderness of his fingertips.  Report fevers, increased problems with hands or feet.  MEDICATIONS PRESCRIBED:  Capecitabine (xeloda)  1000 mg (2 - 500 mg capsules) twice daily for 7 days then off for 7 days and repeating - starting on 06/02/13.  INSTRUCTIONS/FOLLOW-UP: Follow-up in 4 weeks with lab work and MD visit.  Thank you for choosing [facility] to provide your oncology and hematology care.  To afford each patient quality time with our providers, please arrive at least 15 minutes before your scheduled appointment time.  With your help, our goal is to use those 15 minutes to complete the necessary work-up to ensure our physicians have the information they need to help with your evaluation and healthcare recommendations.    Effective January 1st, 2014, we ask that you re-schedule your appointment with our physicians should you arrive 10 or more minutes late for your appointment.  We strive to give you quality time with our providers, and arriving late affects you and other patients whose appointments are after yours.    Again, thank you for choosing [facility].  Our hope is that these requests will decrease the amount of time that you wait before being seen by our physicians.       _____________________________________________________________  Should you have questions after your visit to [facility], please contact our office at [phone] between the hours of 8:30 a.m. and 5:00 p.m.  Voicemails left after 4:30 p.m. will not be returned until the following business day.  For  prescription refill requests, have your pharmacy contact our office with your prescription refill request.

## 2013-05-23 NOTE — Progress Notes (Signed)
[facility]  OFFICE PROGRESS NOTE  Tula Nakayama, MD [address], [address] [place] [address]  DIAGNOSIS: Colon cancer, ascending - Plan: CEA  Anemia due to chronic blood loss  Mental retardation  Chief Complaint  Patient presents with  . Colon cancer stage II    Microsatellite stable, 19% recurrence risk, Xeloda orally  . Pain    tenderness and cracking of fingertips x 2 weeks.    CURRENT THERAPY: Xeloda 1000 mg twice a day for 14 days out of every 21 started on 03/24/2013. Definitive surgery performed on 12/15/2012.  INTERVAL HISTORY: Xavier Tanner 63 y.o. male returns for followup of stage II colon cancer, status post resection on 12/15/2012 on Xeloda adjuvant therapy started on 03/24/2013.  He is scheduled to begin a cycle on 05/26/2013. He complains of soreness of both hands along with cracking of his fingertips. This is despite using emollients. He denies any sore mouth, diarrhea, abdominal pain, nausea, vomiting, foot discomfort, fever, night sweats, nausea, vomiting, melena, hematochezia, hematuria, incontinence, other skin rash, headache, or seizures.  MEDICAL HISTORY: Past Medical History  Diagnosis Date  . Urinary incontinence   . Psychotic disorder   . Diabetes mellitus, type 2   . Hypertension   . Bilateral cataracts 2011  . Vitamin D deficiency   . Mental retardation   . Depression   . Microcytic anemia 12/03/2012  . HOH (hard of hearing)   . Prostate cancer   . 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    INTERIM HISTORY: has ONYCHOMYCOSIS, TOENAILS; Unspecified vitamin D deficiency; HYPERLIPIDEMIA; Unspecified psychosis; HYPERTENSION; FATIGUE; URINARY INCONTINENCE; TINEA CRURIS; Tremor; Mental retardation; Hypokalemia; Microcytic anemia; Colon cancer, ascending; Type 2 diabetes, diet controlled; Depression; Anemia due to chronic blood loss; Dermatomycosis; and Routine general medical examination  at a health care facility on his problem list.    ALLERGIES:  is allergic to ace inhibitors.  MEDICATIONS: has a current medication list which includes the following prescription(s): alum & mag hydroxide-simeth, benzonatate, bisacodyl, capecitabine, diphenhydramine, diphenoxylate-atropine, fluticasone, guaifenesin, hydrochlorothiazide, hydrocodone-acetaminophen, hydrocortisone cream, loperamide, magnesium hydroxide, memantine, omeprazole, ondansetron, pravastatin, solifenacin, tamsulosin, and terbinafine.  SURGICAL HISTORY:  Past Surgical History  Procedure Laterality Date  . Hemorroidectomy  2002  . Transurethral resection of prostate  July 09, 2007  . Colonoscopy N/A 12/04/2012    Procedure: COLONOSCOPY;  Surgeon: Daneil Dolin, MD;  Location: [facility];  Service: Endoscopy;  Laterality: N/A;  . Esophagogastroduodenoscopy N/A 12/04/2012    Procedure: ESOPHAGOGASTRODUODENOSCOPY (EGD);  Surgeon: Daneil Dolin, MD;  Location: [facility];  Service: Endoscopy;  Laterality: N/A;  . Colon resection N/A 12/15/2012    Procedure: HAND ASSISTED LAPAROSCOPIC PARTIAL COLECTOMY;  Surgeon: Donato Heinz, MD;  Location: [facility];  Service: General;  Laterality: N/A;  . Colon resection N/A 12/15/2012    Procedure: COLON RESECTION;  Surgeon: Donato Heinz, MD;  Location: [facility];  Service: General;  Laterality: N/A;    FAMILY HISTORY: family history includes Diabetes in his mother; Hypertension in his mother; Mental illness in his mother; Stroke in his father.  SOCIAL HISTORY:  reports that he has never smoked. He has never used smokeless tobacco. He reports that he does not drink alcohol or use illicit drugs.  REVIEW OF SYSTEMS:  Other than that discussed above is noncontributory.  PHYSICAL EXAMINATION: ECOG PERFORMANCE STATUS: 1 - Symptomatic but completely ambulatory  Blood pressure 113/79, pulse  73, temperature 97.7 F (36.5 C), temperature source Oral, resp. rate 18, weight 144 lb 3.2 oz  (65.409 kg).  GENERAL:alert, no distress and comfortable SKIN: Cracking of the distal upper extremity digits with induration of the palms and tenderness some mild palpation. Feet are normal. EYES: PERLA; Conjunctiva are pink and non-injected, sclera clear OROPHARYNX:no exudate, no erythema on lips, buccal mucosa, or tongue. NECK: supple, thyroid normal size, non-tender, without nodularity. No masses CHEST: Normal AP diameter with no gynecomastia. LYMPH:  no palpable lymphadenopathy in the cervical, axillary or inguinal LUNGS: clear to auscultation and percussion with normal breathing effort HEART: regular rate & rhythm and no murmurs. ABDOMEN:abdomen soft, non-tender and normal bowel sounds MUSCULOSKELETAL:no cyanosis of digits and no clubbing. Range of motion normal.  NEURO: alert & oriented x 3 with fluent speech, no focal motor/sensory deficits   LABORATORY DATA: Infusion on 05/23/2013  Component Date Value Range Status  . WBC 05/23/2013 6.1  4.0 - 10.5 K/uL Final  . RBC 05/23/2013 4.51  4.22 - 5.81 MIL/uL Final  . Hemoglobin 05/23/2013 10.3* 13.0 - 17.0 g/dL Final  . HCT 05/23/2013 31.8* 39.0 - 52.0 % Final  . MCV 05/23/2013 70.5* 78.0 - 100.0 fL Final  . MCH 05/23/2013 22.8* 26.0 - 34.0 pg Final  . MCHC 05/23/2013 32.4  30.0 - 36.0 g/dL Final  . RDW 05/23/2013 23.9* 11.5 - 15.5 % Final  . Platelets 05/23/2013 234  150 - 400 K/uL Final  . Neutrophils Relative % 05/23/2013 59  43 - 77 % Final  . Neutro Abs 05/23/2013 3.6  1.7 - 7.7 K/uL Final  . Lymphocytes Relative 05/23/2013 34  12 - 46 % Final  . Lymphs Abs 05/23/2013 2.1  0.7 - 4.0 K/uL Final  . Monocytes Relative 05/23/2013 6  3 - 12 % Final  . Monocytes Absolute 05/23/2013 0.4  0.1 - 1.0 K/uL Final  . Eosinophils Relative 05/23/2013 0  0 - 5 % Final  . Eosinophils Absolute 05/23/2013 0.0  0.0 - 0.7 K/uL Final  . Basophils Relative 05/23/2013 1  0 - 1 % Final  . Basophils Absolute 05/23/2013 0.0  0.0 - 0.1 K/uL Final  .  Sodium 05/23/2013 139  137 - 147 mEq/L Final  . Potassium 05/23/2013 4.2  3.7 - 5.3 mEq/L Final  . Chloride 05/23/2013 102  96 - 112 mEq/L Final  . CO2 05/23/2013 26  19 - 32 mEq/L Final  . Glucose, Bld 05/23/2013 90  70 - 99 mg/dL Final  . BUN 05/23/2013 19  6 - 23 mg/dL Final  . Creatinine, Ser 05/23/2013 0.77  0.50 - 1.35 mg/dL Final  . Calcium 05/23/2013 9.1  8.4 - 10.5 mg/dL Final  . Total Protein 05/23/2013 7.7  6.0 - 8.3 g/dL Final  . Albumin 05/23/2013 3.9  3.5 - 5.2 g/dL Final  . AST 05/23/2013 16  0 - 37 U/L Final  . ALT 05/23/2013 7  0 - 53 U/L Final  . Alkaline Phosphatase 05/23/2013 69  39 - 117 U/L Final  . Total Bilirubin 05/23/2013 1.0  0.3 - 1.2 mg/dL Final  . GFR calc non Af Amer 05/23/2013 >90  >90 mL/min Final  . GFR calc Af Amer 05/23/2013 >90  >90 mL/min Final   Comment: (NOTE)                          The eGFR has been calculated using the CKD EPI equation.  This calculation has not been validated in all clinical situations.                          eGFR's persistently <90 mL/min signify possible Chronic Kidney                          Disease.  Infusion on 04/25/2013  Component Date Value Range Status  . WBC 04/25/2013 6.2  4.0 - 10.5 K/uL Final  . RBC 04/25/2013 4.80  4.22 - 5.81 MIL/uL Final  . Hemoglobin 04/25/2013 10.7* 13.0 - 17.0 g/dL Final  . HCT 04/25/2013 32.7* 39.0 - 52.0 % Final  . MCV 04/25/2013 68.1* 78.0 - 100.0 fL Final  . MCH 04/25/2013 22.3* 26.0 - 34.0 pg Final  . MCHC 04/25/2013 32.7  30.0 - 36.0 g/dL Final  . RDW 04/25/2013 20.0* 11.5 - 15.5 % Final  . Platelets 04/25/2013 241  150 - 400 K/uL Final  . Neutrophils Relative % 04/25/2013 60  43 - 77 % Final  . Neutro Abs 04/25/2013 3.7  1.7 - 7.7 K/uL Final  . Lymphocytes Relative 04/25/2013 34  12 - 46 % Final  . Lymphs Abs 04/25/2013 2.1  0.7 - 4.0 K/uL Final  . Monocytes Relative 04/25/2013 6  3 - 12 % Final  . Monocytes Absolute 04/25/2013 0.4  0.1 - 1.0 K/uL  Final  . Eosinophils Relative 04/25/2013 0  0 - 5 % Final  . Eosinophils Absolute 04/25/2013 0.0  0.0 - 0.7 K/uL Final  . Basophils Relative 04/25/2013 0  0 - 1 % Final  . Basophils Absolute 04/25/2013 0.0  0.0 - 0.1 K/uL Final  . Sodium 04/25/2013 140  137 - 147 mEq/L Final  . Potassium 04/25/2013 4.2  3.7 - 5.3 mEq/L Final  . Chloride 04/25/2013 102  96 - 112 mEq/L Final  . CO2 04/25/2013 25  19 - 32 mEq/L Final  . Glucose, Bld 04/25/2013 106* 70 - 99 mg/dL Final  . BUN 04/25/2013 18  6 - 23 mg/dL Final  . Creatinine, Ser 04/25/2013 0.88  0.50 - 1.35 mg/dL Final  . Calcium 04/25/2013 9.3  8.4 - 10.5 mg/dL Final  . Total Protein 04/25/2013 7.4  6.0 - 8.3 g/dL Final  . Albumin 04/25/2013 3.8  3.5 - 5.2 g/dL Final  . AST 04/25/2013 18  0 - 37 U/L Final  . ALT 04/25/2013 11  0 - 53 U/L Final  . Alkaline Phosphatase 04/25/2013 65  39 - 117 U/L Final  . Total Bilirubin 04/25/2013 0.9  0.3 - 1.2 mg/dL Final  . GFR calc non Af Amer 04/25/2013 89* >90 mL/min Final  . GFR calc Af Amer 04/25/2013 >90  >90 mL/min Final   Comment: (NOTE)                          The eGFR has been calculated using the CKD EPI equation.                          This calculation has not been validated in all clinical situations.                          eGFR's persistently <90 mL/min signify possible Chronic Kidney  Disease.    PATHOLOGY: Microsatellite stable with high Oncotype DX recurrence score  Urinalysis    Component Value Date/Time   COLORURINE STRAW* 12/22/2011 2143   APPEARANCEUR CLEAR 12/22/2011 2143   LABSPEC <1.005* 12/22/2011 2143   PHURINE 6.0 12/22/2011 2143   GLUCOSEU NEGATIVE 12/22/2011 2143   HGBUR MODERATE* 12/22/2011 2143   BILIRUBINUR neg 07/21/2012 [place] 12/22/2011 2143   [place] 12/22/2011 2143   PROTEINUR NEGATIVE 12/22/2011 2143   UROBILINOGEN 0.2 07/21/2012 1544   UROBILINOGEN 0.2 12/22/2011 2143   NITRITE neg 07/21/2012 1544   NITRITE NEGATIVE  12/22/2011 2143   LEUKOCYTESUR Trace 07/21/2012 1544    RADIOGRAPHIC STUDIES: No results found.  ASSESSMENT:  #1.Palmar erythrodysesthesia do to Xeloda. 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   PLAN:  #1. Hold next cycle of Xeloda until 06/02/2013 and initiate treatment at 1000 mg twice a day with food for 7 days on and 7 days off. #2. Followup in 4 weeks with physical exam, CBC. 3. Continue psychotropic medication.   All questions were answered. The patient knows to call the clinic with any problems, questions or concerns. We can certainly see the patient much sooner if necessary.   I spent 25 minutes counseling the patient face to face. The total time spent in the appointment was 30 minutes.    Doroteo Bradford, MD 05/23/2013 1:30 PM

## 2013-05-24 LAB — CEA: CEA: 10.2 ng/mL — ABNORMAL HIGH (ref 0.0–5.0)

## 2013-06-11 ENCOUNTER — Emergency Department (HOSPITAL_COMMUNITY)
Admission: EM | Admit: 2013-06-11 | Discharge: 2013-06-11 | Disposition: A | Discharge status: Home / Self Care | Payer: Medicare Other | Attending: Emergency Medicine | Admitting: Emergency Medicine

## 2013-06-11 ENCOUNTER — Emergency Department (HOSPITAL_COMMUNITY): Payer: Medicare Other

## 2013-06-11 ENCOUNTER — Encounter (HOSPITAL_COMMUNITY): Payer: Self-pay | Admitting: Emergency Medicine

## 2013-06-11 DIAGNOSIS — R1084 Generalized abdominal pain: Secondary | ICD-10-CM | POA: Insufficient documentation

## 2013-06-11 DIAGNOSIS — Z85038 Personal history of other malignant neoplasm of large intestine: Secondary | ICD-10-CM | POA: Insufficient documentation

## 2013-06-11 DIAGNOSIS — Z862 Personal history of diseases of the blood and blood-forming organs and certain disorders involving the immune mechanism: Secondary | ICD-10-CM | POA: Insufficient documentation

## 2013-06-11 DIAGNOSIS — H269 Unspecified cataract: Secondary | ICD-10-CM | POA: Insufficient documentation

## 2013-06-11 DIAGNOSIS — R109 Unspecified abdominal pain: Secondary | ICD-10-CM

## 2013-06-11 DIAGNOSIS — Z8639 Personal history of other endocrine, nutritional and metabolic disease: Secondary | ICD-10-CM | POA: Insufficient documentation

## 2013-06-11 DIAGNOSIS — R748 Abnormal levels of other serum enzymes: Secondary | ICD-10-CM | POA: Insufficient documentation

## 2013-06-11 DIAGNOSIS — Z8546 Personal history of malignant neoplasm of prostate: Secondary | ICD-10-CM | POA: Insufficient documentation

## 2013-06-11 DIAGNOSIS — I10 Essential (primary) hypertension: Secondary | ICD-10-CM | POA: Insufficient documentation

## 2013-06-11 DIAGNOSIS — F79 Unspecified intellectual disabilities: Secondary | ICD-10-CM | POA: Insufficient documentation

## 2013-06-11 DIAGNOSIS — F329 Major depressive disorder, single episode, unspecified: Secondary | ICD-10-CM | POA: Insufficient documentation

## 2013-06-11 DIAGNOSIS — F3289 Other specified depressive episodes: Secondary | ICD-10-CM | POA: Insufficient documentation

## 2013-06-11 DIAGNOSIS — E119 Type 2 diabetes mellitus without complications: Secondary | ICD-10-CM | POA: Insufficient documentation

## 2013-06-11 DIAGNOSIS — Z79899 Other long term (current) drug therapy: Secondary | ICD-10-CM | POA: Insufficient documentation

## 2013-06-11 DIAGNOSIS — H9190 Unspecified hearing loss, unspecified ear: Secondary | ICD-10-CM | POA: Insufficient documentation

## 2013-06-11 DIAGNOSIS — IMO0002 Reserved for concepts with insufficient information to code with codable children: Secondary | ICD-10-CM | POA: Insufficient documentation

## 2013-06-11 LAB — CBC WITH DIFFERENTIAL/PLATELET
BASOS PCT: 0 % (ref 0–1)
Basophils Absolute: 0 10*3/uL (ref 0.0–0.1)
Eosinophils Absolute: 0 10*3/uL (ref 0.0–0.7)
Eosinophils Relative: 0 % (ref 0–5)
HCT: 33.1 % — ABNORMAL LOW (ref 39.0–52.0)
HEMOGLOBIN: 11 g/dL — AB (ref 13.0–17.0)
LYMPHS PCT: 23 % (ref 12–46)
Lymphs Abs: 1.6 10*3/uL (ref 0.7–4.0)
MCH: 23.5 pg — AB (ref 26.0–34.0)
MCHC: 33.2 g/dL (ref 30.0–36.0)
MCV: 70.7 fL — ABNORMAL LOW (ref 78.0–100.0)
MONO ABS: 0.4 10*3/uL (ref 0.1–1.0)
Monocytes Relative: 6 % (ref 3–12)
NEUTROS PCT: 71 % (ref 43–77)
Neutro Abs: 4.9 10*3/uL (ref 1.7–7.7)
PLATELETS: 216 10*3/uL (ref 150–400)
RBC: 4.68 MIL/uL (ref 4.22–5.81)
RDW: 24.5 % — ABNORMAL HIGH (ref 11.5–15.5)
WBC: 6.9 10*3/uL (ref 4.0–10.5)

## 2013-06-11 LAB — URINE MICROSCOPIC-ADD ON

## 2013-06-11 LAB — URINALYSIS, ROUTINE W REFLEX MICROSCOPIC
BILIRUBIN URINE: NEGATIVE
GLUCOSE, UA: NEGATIVE mg/dL
Ketones, ur: NEGATIVE mg/dL
Nitrite: NEGATIVE
Protein, ur: NEGATIVE mg/dL
SPECIFIC GRAVITY, URINE: 1.01 (ref 1.005–1.030)
Urobilinogen, UA: 0.2 mg/dL (ref 0.0–1.0)
pH: 5.5 (ref 5.0–8.0)

## 2013-06-11 LAB — COMPREHENSIVE METABOLIC PANEL
ALK PHOS: 65 U/L (ref 39–117)
ALT: 10 U/L (ref 0–53)
AST: 20 U/L (ref 0–37)
Albumin: 4.1 g/dL (ref 3.5–5.2)
BUN: 18 mg/dL (ref 6–23)
CALCIUM: 9.4 mg/dL (ref 8.4–10.5)
CO2: 22 mEq/L (ref 19–32)
Chloride: 98 mEq/L (ref 96–112)
Creatinine, Ser: 0.81 mg/dL (ref 0.50–1.35)
GFR calc Af Amer: >90 mL/min (ref >90)
GFR calc non Af Amer: >90 mL/min (ref >90)
GLUCOSE: 102 mg/dL — AB (ref 70–99)
POTASSIUM: 3.3 meq/L — AB (ref 3.7–5.3)
SODIUM: 134 meq/L — AB (ref 137–147)
Total Bilirubin: 1.3 mg/dL — ABNORMAL HIGH (ref 0.3–1.2)
Total Protein: 8.1 g/dL (ref 6.0–8.3)

## 2013-06-11 LAB — LIPASE, BLOOD: Lipase: 63 U/L — ABNORMAL HIGH (ref 11–59)

## 2013-06-11 NOTE — Discharge Instructions (Signed)
Follow up with your md this week to recheck lipase and schedule ultrasound of liver

## 2013-06-11 NOTE — ED Provider Notes (Signed)
CSN: [id number]     Arrival date & time 06/11/13  1457 History   First MD Initiated Contact with Patient 06/11/13 1510     Chief Complaint  Patient presents with  . Abdominal Pain     (Consider location/radiation/quality/duration/timing/severity/associated sxs/prior Treatment) Patient is a 63 y.o. male presenting with abdominal pain. The history is provided by a caregiver (the person from the group home states the pt has abd pain.  the pt cannot talk for mr.  ).  Abdominal Pain Pain location:  Generalized Pain quality comment:  Unknown Pain radiation: unknown. Pain severity:  Mild Onset quality:  Gradual Timing:  Sporadic Progression:  Waxing and waning   Past Medical History  Diagnosis Date  . Urinary incontinence   . Psychotic disorder   . Diabetes mellitus, type 2   . Hypertension   . Bilateral cataracts 2011  . Vitamin D deficiency   . Mental retardation   . Depression   . Microcytic anemia 12/03/2012  . HOH (hard of hearing)   . Prostate cancer   . 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   Past Surgical History  Procedure Laterality Date  . Hemorroidectomy  2002  . Transurethral resection of prostate  July 09, 2007  . Colonoscopy N/A 12/04/2012    Procedure: COLONOSCOPY;  Surgeon: Daneil Dolin, MD;  Location: [facility];  Service: Endoscopy;  Laterality: N/A;  . Esophagogastroduodenoscopy N/A 12/04/2012    Procedure: ESOPHAGOGASTRODUODENOSCOPY (EGD);  Surgeon: Daneil Dolin, MD;  Location: [facility];  Service: Endoscopy;  Laterality: N/A;  . Colon resection N/A 12/15/2012    Procedure: HAND ASSISTED LAPAROSCOPIC PARTIAL COLECTOMY;  Surgeon: Donato Heinz, MD;  Location: [facility];  Service: General;  Laterality: N/A;  . Colon resection N/A 12/15/2012    Procedure: COLON RESECTION;  Surgeon: Donato Heinz, MD;  Location: [facility];  Service: General;  Laterality: N/A;   Family History  Problem Relation Age of Onset  . Mental illness Mother   .  Hypertension Mother   . Diabetes Mother   . Stroke Father    History  Substance Use Topics  . Smoking status: Never Smoker   . Smokeless tobacco: Never Used  . Alcohol Use: No    Review of Systems  Unable to perform ROS: Patient nonverbal  Gastrointestinal: Positive for abdominal pain.      Allergies  Ace inhibitors  Home Medications   Current Outpatient Rx  Name  Route  Sig  Dispense  Refill  . benzonatate (TESSALON) 100 MG capsule   Oral   Take 100 mg by mouth 3 (three) times daily as needed for cough.         . capecitabine (XELODA) 500 MG tablet   Oral   Take 1,000 mg/m2 by mouth 2 (two) times daily after a meal.         . citalopram (CELEXA) 20 MG tablet   Oral   Take 30 mg by mouth daily.         . fluticasone (FLONASE) 50 MCG/ACT nasal spray   Each Nare   Place 1 spray into both nostrils daily.         Marland Kitchen gabapentin (NEURONTIN) 100 MG capsule   Oral   Take 100 mg by mouth at bedtime. May increase up to 3 capsules if needed         . hydrochlorothiazide (MICROZIDE) 12.5 MG capsule   Oral   Take 12.5 mg by mouth  daily.         . Memantine HCl ER (NAMENDA XR) 28 MG CP24   Oral   Take 28 mg by mouth daily.         Marland Kitchen omeprazole (PRILOSEC) 20 MG capsule   Oral   Take 20 mg by mouth daily.         . pravastatin (PRAVACHOL) 40 MG tablet   Oral   Take 40 mg by mouth daily.         . solifenacin (VESICARE) 5 MG tablet   Oral   Take 5 mg by mouth daily.         . tamsulosin (FLOMAX) 0.4 MG CAPS capsule   Oral   Take 0.4 mg by mouth daily.         Marland Kitchen terbinafine (LAMISIL) 1 % cream   Topical   Apply 1 application topically 2 (two) times daily.         Marland Kitchen alum & mag hydroxide-simeth (MAALOX/MYLANTA) 200-200-20 MG/5ML suspension   Oral   Take 10 mLs by mouth as needed for indigestion.         . bisacodyl (DULCOLAX) 5 MG EC tablet   Oral   Take 5 mg by mouth 3 (three) times daily as needed for constipation.         .  diphenhydrAMINE (SOMINEX) 25 MG tablet   Oral   Take 25 mg by mouth at bedtime as needed for sleep.         . diphenoxylate-atropine (LOMOTIL) 2.5-0.025 MG per tablet   Oral   Take 1 tablet by mouth 4 (four) times daily as needed for diarrhea or loose stools.   30 tablet   0   . guaiFENesin (MUCINEX) 600 MG 12 hr tablet   Oral   Take 1,200 mg by mouth every 12 (twelve) hours as needed for congestion.         Marland Kitchen HYDROcodone-acetaminophen (NORCO) 5-325 MG per tablet   Oral   Take 1-2 tablets by mouth every 4 (four) hours as needed for pain.   45 tablet   0   . hydrocortisone cream 1 %   Topical   Apply 1 application topically 3 (three) times daily. Apply to affected area         . loperamide (IMODIUM) 2 MG capsule   Oral   Take 2 mg by mouth as needed for diarrhea or loose stools.         . magnesium hydroxide (MILK OF MAGNESIA) 400 MG/5ML suspension   Oral   Take 30 mLs by mouth 3 (three) times daily as needed for constipation (If no bowel movement after 3 days may repeat x3 doses).         . ondansetron (ZOFRAN) 8 MG tablet   Oral   Take 1 tablet (8 mg total) by mouth every 8 (eight) hours as needed for nausea or vomiting.   30 tablet   3    BP 135/79  Pulse 84  Temp(Src) 98 F (36.7 C) (Oral)  Resp 18  Wt 144 lb (65.318 kg)  SpO2 100% Physical Exam  Constitutional: He appears well-developed.  HENT:  Head: Normocephalic.  Eyes: Conjunctivae and EOM are normal. No scleral icterus.  Neck: Neck supple. No thyromegaly present.  Cardiovascular: Normal rate and regular rhythm.  Exam reveals no gallop and no friction rub.   No murmur heard. Pulmonary/Chest: No stridor. He has no wheezes. He has no rales. He exhibits no tenderness.  Abdominal: He exhibits no distension. There is no tenderness. There is no rebound.  Musculoskeletal: Normal range of motion. He exhibits no edema.  Lymphadenopathy:    He has no cervical adenopathy.  Neurological: He is alert.  He exhibits normal muscle tone. Coordination normal.  Skin: No rash noted. No erythema.    ED Course  Procedures (including critical care time) Labs Review Labs Reviewed  CBC WITH DIFFERENTIAL - Abnormal; Notable for the following:    Hemoglobin 11.0 (*)    HCT 33.1 (*)    MCV 70.7 (*)    MCH 23.5 (*)    RDW 24.5 (*)    All other components within normal limits  COMPREHENSIVE METABOLIC PANEL - Abnormal; Notable for the following:    Sodium 134 (*)    Potassium 3.3 (*)    Glucose, Bld 102 (*)    Total Bilirubin 1.3 (*)    All other components within normal limits  LIPASE, BLOOD - Abnormal; Notable for the following:    Lipase 63 (*)    All other components within normal limits  URINALYSIS, ROUTINE W REFLEX MICROSCOPIC - Abnormal; Notable for the following:    APPearance HAZY (*)    Hgb urine dipstick MODERATE (*)    Leukocytes, UA SMALL (*)    All other components within normal limits  URINE MICROSCOPIC-ADD ON   Imaging Review Ct Abdomen Pelvis Wo Contrast  06/11/2013   CLINICAL DATA:  Abdominal pain. Evaluate for kidney stone. History of diabetes. Mentally handicapped. Prostate cancer. Colon cancer.  EXAM: CT ABDOMEN AND PELVIS WITHOUT CONTRAST  TECHNIQUE: Multidetector CT imaging of the abdomen and pelvis was performed following the standard protocol without intravenous contrast.  COMPARISON:  CT ABD - PELV W/ CM dated 11/24/2011  FINDINGS: Lower Chest: Moderate motion degradation. Lung bases grossly clear. Normal heart size without pericardial or pleural effusion.  Abdomen/Pelvis: Moderate motion degradation continuing into the abdomen and pelvis. Apparent hypoattenuation in the inferior right lobe of the liver on image 37 could be artifact.  Grossly normal noncontrast appearance of the spleen. Small hiatal hernia. Normal pancreas, gallbladder, biliary tract, adrenal glands.  No gross urinary tract calculus or hydronephrosis. No hydroureter or ureteric calculi. No retroperitoneal or  retrocrural adenopathy. Colonic stool burden suggests constipation. Surgical changes, likely of partial right-sided hemicolectomy. Small bowel normal in caliber. No gross ascites or free intraperitoneal air. Ventral abdominal wall hernias or areas of laxity containing nonobstructive bowel on images 39 and 46.  No pelvic adenopathy. Normal urinary bladder. TURP defect. Tiny fat containing right inguinal hernia. No significant free fluid.  Bones/Musculoskeletal:  No gross osseous abnormality.  IMPRESSION: 1. Moderately motion degraded exam. 2. Given this factor, no urinary tract calculi or hydronephrosis. 3.  Possible constipation. 4. Possible inferior right liver lobe low-density lesion versus artifact. Given the history of colon cancer, nonemergent ultrasound could be performed to exclude liver lesion. 5. Nonobstructive small bowel within ventral abdominal wall hernias or areas of wall laxity.   Electronically Signed   By: Abigail Miyamoto M.D.   On: 06/11/2013 16:51     EKG Interpretation None      MDM   Final diagnoses:  Abdominal pain    Very mildly elevated lipase.  Ct neg.  Will culture urine.  Pt appears without pain at discharge.  Pt to follow up with pcp this week   Maudry Diego, MD 06/11/13 [phone]

## 2013-06-11 NOTE — ED Notes (Signed)
Pt is a resident of [facility]. Staff member states pt has been c/o of abdominal pain today. Denies any other symptoms at this time.

## 2013-06-15 ENCOUNTER — Encounter: Payer: Self-pay | Admitting: Family Medicine

## 2013-06-15 ENCOUNTER — Ambulatory Visit (INDEPENDENT_AMBULATORY_CARE_PROVIDER_SITE_OTHER): Payer: Medicare Other | Admitting: Family Medicine

## 2013-06-15 VITALS — BP 108/78 | HR 76 | Resp 18 | Wt 142.0 lb

## 2013-06-15 DIAGNOSIS — E119 Type 2 diabetes mellitus without complications: Secondary | ICD-10-CM

## 2013-06-15 DIAGNOSIS — I10 Essential (primary) hypertension: Secondary | ICD-10-CM

## 2013-06-15 DIAGNOSIS — R634 Abnormal weight loss: Secondary | ICD-10-CM

## 2013-06-15 DIAGNOSIS — K859 Acute pancreatitis without necrosis or infection, unspecified: Secondary | ICD-10-CM

## 2013-06-15 DIAGNOSIS — F329 Major depressive disorder, single episode, unspecified: Secondary | ICD-10-CM

## 2013-06-15 DIAGNOSIS — F3289 Other specified depressive episodes: Secondary | ICD-10-CM

## 2013-06-15 DIAGNOSIS — R32 Unspecified urinary incontinence: Secondary | ICD-10-CM

## 2013-06-15 DIAGNOSIS — R1011 Right upper quadrant pain: Secondary | ICD-10-CM

## 2013-06-15 DIAGNOSIS — F32A Depression, unspecified: Secondary | ICD-10-CM

## 2013-06-15 DIAGNOSIS — C182 Malignant neoplasm of ascending colon: Secondary | ICD-10-CM

## 2013-06-15 DIAGNOSIS — E785 Hyperlipidemia, unspecified: Secondary | ICD-10-CM

## 2013-06-15 NOTE — Patient Instructions (Signed)
F/u mid May  You need to have urine sent for c/s asap  Labs today lipase , chem 7 and hBA1C  New due to weight loss and poor apetitie is ensure 3 cans daily  You are referred for [place] of your liver

## 2013-06-16 LAB — BASIC METABOLIC PANEL
BUN: 21 mg/dL (ref 6–23)
CALCIUM: 9.4 mg/dL (ref 8.4–10.5)
CO2: 27 mEq/L (ref 19–32)
Chloride: 102 mEq/L (ref 96–112)
Creat: 0.72 mg/dL (ref 0.50–1.35)
GLUCOSE: 90 mg/dL (ref 70–99)
Potassium: 3.3 mEq/L — ABNORMAL LOW (ref 3.5–5.3)
SODIUM: 137 meq/L (ref 135–145)

## 2013-06-16 LAB — LIPASE: LIPASE: 93 U/L — AB (ref 0–75)

## 2013-06-16 LAB — HEMOGLOBIN A1C
Hgb A1c MFr Bld: 5.8 % — ABNORMAL HIGH (ref <5.7)
Mean Plasma Glucose: 120 mg/dL — ABNORMAL HIGH (ref <117)

## 2013-06-17 ENCOUNTER — Ambulatory Visit (HOSPITAL_COMMUNITY)
Admission: RE | Admit: 2013-06-17 | Discharge: 2013-06-17 | Disposition: A | Discharge status: Home / Self Care | Payer: Medicare Other | Source: Ambulatory Visit | Attending: Family Medicine | Admitting: Family Medicine

## 2013-06-17 DIAGNOSIS — R1011 Right upper quadrant pain: Secondary | ICD-10-CM

## 2013-06-17 DIAGNOSIS — K7689 Other specified diseases of liver: Secondary | ICD-10-CM | POA: Insufficient documentation

## 2013-06-19 LAB — URINE CULTURE: Colony Count: >=100000

## 2013-06-20 ENCOUNTER — Encounter (HOSPITAL_BASED_OUTPATIENT_CLINIC_OR_DEPARTMENT_OTHER): Payer: Medicare Other

## 2013-06-20 ENCOUNTER — Telehealth (HOSPITAL_COMMUNITY): Payer: Self-pay | Admitting: *Deleted

## 2013-06-20 ENCOUNTER — Encounter (HOSPITAL_COMMUNITY): Payer: Medicare Other | Attending: Hematology and Oncology

## 2013-06-20 VITALS — BP 116/72 | HR 72 | Temp 97.4°F | Resp 20 | Wt 137.2 lb

## 2013-06-20 DIAGNOSIS — C182 Malignant neoplasm of ascending colon: Secondary | ICD-10-CM | POA: Insufficient documentation

## 2013-06-20 DIAGNOSIS — F79 Unspecified intellectual disabilities: Secondary | ICD-10-CM

## 2013-06-20 LAB — CBC WITH DIFFERENTIAL/PLATELET
BASOS ABS: 0 10*3/uL (ref 0.0–0.1)
Basophils Relative: 0 % (ref 0–1)
EOS ABS: 0 10*3/uL (ref 0.0–0.7)
EOS PCT: 0 % (ref 0–5)
HEMATOCRIT: 35 % — AB (ref 39.0–52.0)
Hemoglobin: 11.2 g/dL — ABNORMAL LOW (ref 13.0–17.0)
LYMPHS PCT: 31 % (ref 12–46)
Lymphs Abs: 2 10*3/uL (ref 0.7–4.0)
MCH: 23.2 pg — ABNORMAL LOW (ref 26.0–34.0)
MCHC: 32 g/dL (ref 30.0–36.0)
MCV: 72.6 fL — ABNORMAL LOW (ref 78.0–100.0)
MONO ABS: 0.3 10*3/uL (ref 0.1–1.0)
Monocytes Relative: 5 % (ref 3–12)
Neutro Abs: 4.1 10*3/uL (ref 1.7–7.7)
Neutrophils Relative %: 64 % (ref 43–77)
Platelets: 291 10*3/uL (ref 150–400)
RBC: 4.82 MIL/uL (ref 4.22–5.81)
RDW: 24.3 % — ABNORMAL HIGH (ref 11.5–15.5)
WBC: 6.4 10*3/uL (ref 4.0–10.5)

## 2013-06-20 LAB — COMPREHENSIVE METABOLIC PANEL
ALBUMIN: 3.9 g/dL (ref 3.5–5.2)
ALT: 11 U/L (ref 0–53)
AST: 22 U/L (ref 0–37)
Alkaline Phosphatase: 57 U/L (ref 39–117)
BUN: 16 mg/dL (ref 6–23)
CHLORIDE: 102 meq/L (ref 96–112)
CO2: 27 meq/L (ref 19–32)
CREATININE: 0.82 mg/dL (ref 0.50–1.35)
Calcium: 9.4 mg/dL (ref 8.4–10.5)
GFR calc Af Amer: >90 mL/min (ref >90)
Glucose, Bld: 90 mg/dL (ref 70–99)
Potassium: 3.8 mEq/L (ref 3.7–5.3)
Sodium: 140 mEq/L (ref 137–147)
Total Bilirubin: 1.1 mg/dL (ref 0.3–1.2)
Total Protein: 7.8 g/dL (ref 6.0–8.3)

## 2013-06-20 NOTE — Progress Notes (Signed)
Xavier Tanner  OFFICE PROGRESS NOTE  Xavier Nakayama, MD [address], [address] [place] [address]  DIAGNOSIS: Colon cancer, ascending - Plan: CEA, CBC with Differential, CEA, Comprehensive metabolic panel  Mental retardation  Chief Complaint  Patient presents with  . Stage II colon cancer    Adjuvant Xeloda 7 days on and 7 days off  . Follow-up    CURRENT THERAPY: Xeloda 1000 mg twice a day for 14 days out of every 21 started on 03/24/2013 change to 7 days on and 7 days off on 05/23/2013 due 2 Palmer peripheral dysesthesia with definitive surgery for stage II colon cancer performed on 12/15/2012. Tumor demonstrated microsatellite stability with a 19% recurrence risk and for that reason Xeloda was recommended.  INTERVAL HISTORY: Xavier Tanner 63 y.o. male returns for followup of stage II, microsatellite stable colon cancer, status post resection on 12/15/2012 started on adjuvant therapy on 03/24/2013 with last visit on 05/23/2013 revealing evidence of soreness of both hands with cracking of his fingertips. Last cycle of treatment was started on 06/02/2013 with medication given at 1000 mg twice a day for 7 days on and 7 days off in order to diminish erythrodysesthesia of the upper extremities.  He was seen in the emergency room recently with urinary frequency. CT scan was done which failed to reveal evidence of a kidney stone. He also underwent ultrasound which was suggestive of a possible liver metastasis. Hands have cleared up from previous erythrodysesthesia. He denies any significant pain, sore throat, or chills.  MEDICAL HISTORY: Past Medical History  Diagnosis Date  . Urinary incontinence   . Psychotic disorder   . Diabetes mellitus, type 2   . Hypertension   . Bilateral cataracts 2011  . Vitamin D deficiency   . Mental retardation   . Depression   . Microcytic anemia 12/03/2012  . HOH (hard of hearing)   . Prostate cancer   . Colon  cancer, ascending 12/04/2012    New diagnosis.    INTERIM HISTORY: has ONYCHOMYCOSIS, TOENAILS; Unspecified vitamin D deficiency; HYPERLIPIDEMIA; Unspecified psychosis; HYPERTENSION; FATIGUE; URINARY INCONTINENCE; TINEA CRURIS; Tremor; Mental retardation; Hypokalemia; Microcytic anemia; Colon cancer, ascending; Type 2 diabetes, diet controlled; Depression; Anemia due to chronic blood loss; Dermatomycosis; Routine general medical examination at a health care facility; and RUQ pain on his problem list.   Stage II colon cancer, status post resection on 12/15/2012 on Xeloda adjuvant therapy started on 03/24/2013.  ALLERGIES:  is allergic to ace inhibitors.  MEDICATIONS: has a current medication list which includes the following prescription(s): alum & mag hydroxide-simeth, capecitabine, citalopram, fluticasone, gabapentin, hydrochlorothiazide, hydrocortisone cream, magnesium hydroxide, memantine hcl er, omeprazole, potassium chloride, pravastatin, solifenacin, tamsulosin, terbinafine, benzonatate, bisacodyl, diphenhydramine, diphenoxylate-atropine, guaifenesin, hydrocodone-acetaminophen, loperamide, and ondansetron.  SURGICAL HISTORY:  Past Surgical History  Procedure Laterality Date  . Hemorroidectomy  2002  . Transurethral resection of prostate  July 09, 2007  . Colonoscopy N/A 12/04/2012    Procedure: COLONOSCOPY;  Surgeon: Daneil Dolin, MD;  Location: [facility];  Service: Endoscopy;  Laterality: N/A;  . Esophagogastroduodenoscopy N/A 12/04/2012    Procedure: ESOPHAGOGASTRODUODENOSCOPY (EGD);  Surgeon: Daneil Dolin, MD;  Location: [facility];  Service: Endoscopy;  Laterality: N/A;  . Colon resection N/A 12/15/2012    Procedure: HAND ASSISTED LAPAROSCOPIC PARTIAL COLECTOMY;  Surgeon: Donato Heinz, MD;  Location: [facility];  Service: General;  Laterality: N/A;  . Colon resection N/A 12/15/2012  Procedure: COLON RESECTION;  Surgeon: Donato Heinz, MD;  Location: [facility];  Service: General;   Laterality: N/A;    FAMILY HISTORY: family history includes Diabetes in his mother; Hypertension in his mother; Mental illness in his mother; Stroke in his father.  SOCIAL HISTORY:  reports that he has never smoked. He has never used smokeless tobacco. He reports that he does not drink alcohol or use illicit drugs.  REVIEW OF SYSTEMS:  Other than that discussed above is noncontributory.  PHYSICAL EXAMINATION: ECOG PERFORMANCE STATUS: 1 - Symptomatic but completely ambulatory  Blood pressure 116/72, pulse 72, temperature 97.4 F (36.3 C), temperature source Oral, resp. rate 20, weight 137 lb 3.2 oz (62.234 kg).  GENERAL:alert, no distress and comfortable. Seems agitated today with a lot of spontaneous motion. SKIN: skin color, texture, turgor are normal, no rashes or significant lesions EYES: PERLA; Conjunctiva are pink and non-injected, sclera clear OROPHARYNX:no exudate, no erythema on lips, buccal mucosa, or tongue. NECK: supple, thyroid normal size, non-tender, without nodularity. No masses CHEST: Normal AP diameter with no breast masses. LYMPH:  no palpable lymphadenopathy in the cervical, axillary or inguinal LUNGS: clear to auscultation and percussion with normal breathing effort HEART: regular rate & rhythm and no murmurs. ABDOMEN:abdomen soft, non-tender and normal bowel sounds. Surgical wound well healed with no hepatosplenomegaly, ascites, or CVA tenderness. MUSCULOSKELETAL:no cyanosis of digits and no clubbing. Range of motion normal.  NEURO: Much spontaneous movement today. Answers questions appropriately but is not very vocative.    LABORATORY DATA: Infusion on 06/20/2013  Component Date Value Ref Range Status  . WBC 06/20/2013 6.4  4.0 - 10.5 K/uL Final  . RBC 06/20/2013 4.82  4.22 - 5.81 MIL/uL Final  . Hemoglobin 06/20/2013 11.2* 13.0 - 17.0 g/dL Final  . HCT 06/20/2013 35.0* 39.0 - 52.0 % Final  . MCV 06/20/2013 72.6* 78.0 - 100.0 fL Final  . MCH 06/20/2013  23.2* 26.0 - 34.0 pg Final  . MCHC 06/20/2013 32.0  30.0 - 36.0 g/dL Final  . RDW 06/20/2013 24.3* 11.5 - 15.5 % Final  . Platelets 06/20/2013 291  150 - 400 K/uL Final  . Neutrophils Relative % 06/20/2013 64  43 - 77 % Final  . Neutro Abs 06/20/2013 4.1  1.7 - 7.7 K/uL Final  . Lymphocytes Relative 06/20/2013 31  12 - 46 % Final  . Lymphs Abs 06/20/2013 2.0  0.7 - 4.0 K/uL Final  . Monocytes Relative 06/20/2013 5  3 - 12 % Final  . Monocytes Absolute 06/20/2013 0.3  0.1 - 1.0 K/uL Final  . Eosinophils Relative 06/20/2013 0  0 - 5 % Final  . Eosinophils Absolute 06/20/2013 0.0  0.0 - 0.7 K/uL Final  . Basophils Relative 06/20/2013 0  0 - 1 % Final  . Basophils Absolute 06/20/2013 0.0  0.0 - 0.1 K/uL Final  . Sodium 06/20/2013 140  137 - 147 mEq/L Final  . Potassium 06/20/2013 3.8  3.7 - 5.3 mEq/L Final  . Chloride 06/20/2013 102  96 - 112 mEq/L Final  . CO2 06/20/2013 27  19 - 32 mEq/L Final  . Glucose, Bld 06/20/2013 90  70 - 99 mg/dL Final  . BUN 06/20/2013 16  6 - 23 mg/dL Final  . Creatinine, Ser 06/20/2013 0.82  0.50 - 1.35 mg/dL Final  . Calcium 06/20/2013 9.4  8.4 - 10.5 mg/dL Final  . Total Protein 06/20/2013 7.8  6.0 - 8.3 g/dL Final  . Albumin 06/20/2013 3.9  3.5 - 5.2 g/dL  Final  . AST 06/20/2013 22  0 - 37 U/L Final  . ALT 06/20/2013 11  0 - 53 U/L Final  . Alkaline Phosphatase 06/20/2013 57  39 - 117 U/L Final  . Total Bilirubin 06/20/2013 1.1  0.3 - 1.2 mg/dL Final  . GFR calc non Af Amer 06/20/2013 >90  >90 mL/min Final  . GFR calc Af Amer 06/20/2013 >90  >90 mL/min Final   Comment: (NOTE)                          The eGFR has been calculated using the CKD EPI equation.                          This calculation has not been validated in all clinical situations.                          eGFR's persistently <90 mL/min signify possible Chronic Kidney                          Disease.  Office Visit on 06/15/2013  Component Date Value Ref Range Status  . Sodium  06/15/2013 137  135 - 145 mEq/L Final  . Potassium 06/15/2013 3.3* 3.5 - 5.3 mEq/L Final  . Chloride 06/15/2013 102  96 - 112 mEq/L Final  . CO2 06/15/2013 27  19 - 32 mEq/L Final  . Glucose, Bld 06/15/2013 90  70 - 99 mg/dL Final  . BUN 06/15/2013 21  6 - 23 mg/dL Final  . Creat 06/15/2013 0.72  0.50 - 1.35 mg/dL Final  . Calcium 06/15/2013 9.4  8.4 - 10.5 mg/dL Final  . Hemoglobin A1C 06/15/2013 5.8* <5.7 % Final   Comment:                                                                                                 According to the ADA Clinical Practice Recommendations for 2011, when                          HbA1c is used as a screening test:                                                       >=6.5%   Diagnostic of Diabetes Mellitus                                     (if abnormal result is confirmed)  5.7-6.4%   Increased risk of developing Diabetes Mellitus                                                     References:Diagnosis and Classification of Diabetes Mellitus,Diabetes                          HWTU,8828,00(LKJZP 1):S62-S69 and Standards of Medical Care in                                  Diabetes - 2011,Diabetes HXTA,5697,94 (Suppl 1):S11-S61.                             . Mean Plasma Glucose 06/15/2013 120* <117 mg/dL Final  . Lipase 06/15/2013 93* 0 - 75 U/L Final  . Colony Count 06/15/2013 >=100,000 COLONIES/ML   Final  . Organism ID, Bacteria 06/15/2013 Multiple bacterial morphotypes present, none   Final  . Organism ID, Bacteria 06/15/2013 predominant. Suggest appropriate recollection if    Final  . Organism ID, Bacteria 06/15/2013 clinically indicated.   Final  Admission on 06/11/2013, Discharged on 06/11/2013  Component Date Value Ref Range Status  . WBC 06/11/2013 6.9  4.0 - 10.5 K/uL Final  . RBC 06/11/2013 4.68  4.22 - 5.81 MIL/uL Final  . Hemoglobin 06/11/2013 11.0* 13.0 - 17.0 g/dL Final  . HCT  06/11/2013 33.1* 39.0 - 52.0 % Final  . MCV 06/11/2013 70.7* 78.0 - 100.0 fL Final  . MCH 06/11/2013 23.5* 26.0 - 34.0 pg Final  . MCHC 06/11/2013 33.2  30.0 - 36.0 g/dL Final  . RDW 06/11/2013 24.5* 11.5 - 15.5 % Final  . Platelets 06/11/2013 216  150 - 400 K/uL Final  . Neutrophils Relative % 06/11/2013 71  43 - 77 % Final  . Lymphocytes Relative 06/11/2013 23  12 - 46 % Final  . Monocytes Relative 06/11/2013 6  3 - 12 % Final  . Eosinophils Relative 06/11/2013 0  0 - 5 % Final  . Basophils Relative 06/11/2013 0  0 - 1 % Final  . Neutro Abs 06/11/2013 4.9  1.7 - 7.7 K/uL Final  . Lymphs Abs 06/11/2013 1.6  0.7 - 4.0 K/uL Final  . Monocytes Absolute 06/11/2013 0.4  0.1 - 1.0 K/uL Final  . Eosinophils Absolute 06/11/2013 0.0  0.0 - 0.7 K/uL Final  . Basophils Absolute 06/11/2013 0.0  0.0 - 0.1 K/uL Final  . RBC Morphology 06/11/2013 TEARDROP CELLS   Final   ANISOCYTES  . Sodium 06/11/2013 134* 137 - 147 mEq/L Final  . Potassium 06/11/2013 3.3* 3.7 - 5.3 mEq/L Final  . Chloride 06/11/2013 98  96 - 112 mEq/L Final  . CO2 06/11/2013 22  19 - 32 mEq/L Final  . Glucose, Bld 06/11/2013 102* 70 - 99 mg/dL Final  . BUN 06/11/2013 18  6 - 23 mg/dL Final  . Creatinine, Ser 06/11/2013 0.81  0.50 - 1.35 mg/dL Final  . Calcium 06/11/2013 9.4  8.4 - 10.5 mg/dL Final  . Total Protein 06/11/2013 8.1  6.0 - 8.3 g/dL Final  . Albumin 06/11/2013 4.1  3.5 - 5.2 g/dL Final  . AST 06/11/2013 20  0 - 37 U/L Final  .  ALT 06/11/2013 10  0 - 53 U/L Final  . Alkaline Phosphatase 06/11/2013 65  39 - 117 U/L Final  . Total Bilirubin 06/11/2013 1.3* 0.3 - 1.2 mg/dL Final  . GFR calc non Af Amer 06/11/2013 >90  >90 mL/min Final  . GFR calc Af Amer 06/11/2013 >90  >90 mL/min Final   Comment: (NOTE)                          The eGFR has been calculated using the CKD EPI equation.                          This calculation has not been validated in all clinical situations.                          eGFR's  persistently <90 mL/min signify possible Chronic Kidney                          Disease.  . Lipase 06/11/2013 63* 11 - 59 U/L Final  . Color, Urine 06/11/2013 YELLOW  YELLOW Final  . APPearance 06/11/2013 HAZY* CLEAR Final  . Specific Gravity, Urine 06/11/2013 1.010  1.005 - 1.030 Final  . pH 06/11/2013 5.5  5.0 - 8.0 Final  . Glucose, UA 06/11/2013 NEGATIVE  NEGATIVE mg/dL Final  . Hgb urine dipstick 06/11/2013 MODERATE* NEGATIVE Final  . Bilirubin Urine 06/11/2013 NEGATIVE  NEGATIVE Final  . Ketones, ur 06/11/2013 NEGATIVE  NEGATIVE mg/dL Final  . Protein, ur 06/11/2013 NEGATIVE  NEGATIVE mg/dL Final  . Urobilinogen, UA 06/11/2013 0.2  0.0 - 1.0 mg/dL Final  . Nitrite 06/11/2013 NEGATIVE  NEGATIVE Final  . Leukocytes, UA 06/11/2013 SMALL* NEGATIVE Final  . Squamous Epithelial / LPF 06/11/2013 RARE  RARE Final  . WBC, UA 06/11/2013 7-10  <3 WBC/hpf Final  . RBC / HPF 06/11/2013 0-2  <3 RBC/hpf Final  . Bacteria, UA 06/11/2013 RARE  RARE Final  Infusion on 05/23/2013  Component Date Value Ref Range Status  . WBC 05/23/2013 6.1  4.0 - 10.5 K/uL Final  . RBC 05/23/2013 4.51  4.22 - 5.81 MIL/uL Final  . Hemoglobin 05/23/2013 10.3* 13.0 - 17.0 g/dL Final  . HCT 05/23/2013 31.8* 39.0 - 52.0 % Final  . MCV 05/23/2013 70.5* 78.0 - 100.0 fL Final  . MCH 05/23/2013 22.8* 26.0 - 34.0 pg Final  . MCHC 05/23/2013 32.4  30.0 - 36.0 g/dL Final  . RDW 05/23/2013 23.9* 11.5 - 15.5 % Final  . Platelets 05/23/2013 234  150 - 400 K/uL Final  . Neutrophils Relative % 05/23/2013 59  43 - 77 % Final  . Neutro Abs 05/23/2013 3.6  1.7 - 7.7 K/uL Final  . Lymphocytes Relative 05/23/2013 34  12 - 46 % Final  . Lymphs Abs 05/23/2013 2.1  0.7 - 4.0 K/uL Final  . Monocytes Relative 05/23/2013 6  3 - 12 % Final  . Monocytes Absolute 05/23/2013 0.4  0.1 - 1.0 K/uL Final  . Eosinophils Relative 05/23/2013 0  0 - 5 % Final  . Eosinophils Absolute 05/23/2013 0.0  0.0 - 0.7 K/uL Final  . Basophils Relative  05/23/2013 1  0 - 1 % Final  . Basophils Absolute 05/23/2013 0.0  0.0 - 0.1 K/uL Final  . Sodium 05/23/2013 139  137 - 147 mEq/L Final  . Potassium 05/23/2013 4.2  3.7 - 5.3 mEq/L Final  . Chloride 05/23/2013 102  96 - 112 mEq/L Final  . CO2 05/23/2013 26  19 - 32 mEq/L Final  . Glucose, Bld 05/23/2013 90  70 - 99 mg/dL Final  . BUN 05/23/2013 19  6 - 23 mg/dL Final  . Creatinine, Ser 05/23/2013 0.77  0.50 - 1.35 mg/dL Final  . Calcium 05/23/2013 9.1  8.4 - 10.5 mg/dL Final  . Total Protein 05/23/2013 7.7  6.0 - 8.3 g/dL Final  . Albumin 05/23/2013 3.9  3.5 - 5.2 g/dL Final  . AST 05/23/2013 16  0 - 37 U/L Final  . ALT 05/23/2013 7  0 - 53 U/L Final  . Alkaline Phosphatase 05/23/2013 69  39 - 117 U/L Final  . Total Bilirubin 05/23/2013 1.0  0.3 - 1.2 mg/dL Final  . GFR calc non Af Amer 05/23/2013 >90  >90 mL/min Final  . GFR calc Af Amer 05/23/2013 >90  >90 mL/min Final   Comment: (NOTE)                          The eGFR has been calculated using the CKD EPI equation.                          This calculation has not been validated in all clinical situations.                          eGFR's persistently <90 mL/min signify possible Chronic Kidney                          Disease.  . 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 status post segmental resection with clear margins and 15 lymph nodes all negative in the surgical specimen, microsatellite stable, Oncotype DX colon recurrence score 44 with an anticipated relapse rate of 19% over the next 5 years   Urinalysis    Component Value Date/Time   COLORURINE YELLOW 06/11/2013 1635   APPEARANCEUR HAZY* 06/11/2013 1635   LABSPEC 1.010 06/11/2013 1635   PHURINE 5.5 06/11/2013 1635   GLUCOSEU NEGATIVE 06/11/2013 1635   HGBUR MODERATE* 06/11/2013 1635   BILIRUBINUR NEGATIVE 06/11/2013 1635   BILIRUBINUR neg 07/21/2012 1544   KETONESUR NEGATIVE  06/11/2013 1635   PROTEINUR NEGATIVE 06/11/2013 1635   UROBILINOGEN 0.2 06/11/2013 1635   UROBILINOGEN 0.2 07/21/2012 1544   NITRITE NEGATIVE 06/11/2013 1635   NITRITE neg 07/21/2012 1544   LEUKOCYTESUR SMALL* 06/11/2013 1635    RADIOGRAPHIC STUDIES: Ct Abdomen Pelvis Wo Contrast  06/11/2013   CLINICAL DATA:  Abdominal pain. Evaluate for kidney stone. History of diabetes. Mentally handicapped. Prostate cancer. Colon cancer.  EXAM: CT ABDOMEN AND PELVIS WITHOUT CONTRAST  TECHNIQUE: Multidetector CT imaging of the abdomen and pelvis was performed following the standard protocol without intravenous contrast.  COMPARISON:  CT ABD - PELV W/ CM dated 11/24/2011  FINDINGS: Lower Chest: Moderate motion degradation. Lung bases grossly clear. Normal heart size without pericardial or pleural effusion.  Abdomen/Pelvis: Moderate motion degradation continuing into the abdomen and pelvis. Apparent hypoattenuation in the inferior right lobe of the liver on image 37 could be artifact.  Grossly normal noncontrast appearance of the spleen. Small hiatal hernia. Normal pancreas, gallbladder, biliary tract, adrenal glands.  No gross urinary tract calculus or hydronephrosis. No hydroureter or ureteric calculi. No retroperitoneal or retrocrural adenopathy. Colonic stool burden suggests constipation. Surgical changes, likely of partial right-sided hemicolectomy. Small bowel normal in caliber. No gross ascites or free intraperitoneal air. Ventral abdominal wall hernias or areas of laxity containing nonobstructive bowel on images 39 and 46.  No pelvic adenopathy. Normal urinary bladder. TURP defect. Tiny fat containing right inguinal hernia. No significant free fluid.  Bones/Musculoskeletal:  No gross osseous abnormality.  IMPRESSION: 1. Moderately motion degraded exam. 2. Given this factor, no urinary tract calculi or hydronephrosis. 3.  Possible constipation. 4. Possible inferior right liver lobe low-density lesion versus artifact. Given  the history of colon cancer, nonemergent ultrasound could be performed to exclude liver lesion. 5. Nonobstructive small bowel within ventral abdominal wall hernias or areas of wall laxity.   Electronically Signed   By: Abigail Miyamoto M.D.   On: 06/11/2013 16:51   US Abdomen Limited Ruq  06/17/2013   CLINICAL DATA:  Evaluate for liver lesion  EXAM: US ABDOMEN LIMITED - RIGHT UPPER QUADRANT  COMPARISON:  None.  FINDINGS: Gallbladder:  No gallstones or wall thickening visualized. No sonographic Murphy sign noted.  Common bile duct:  Diameter: 3 mm  Liver:  There is a very subtle area within the inferior right hepatic lobe measuring 2.3 x 1.5 x 2.0 cm. This appears to be surrounded by a hypoechoic capsule and may represent a small liver tumor. The liver is otherwise within normal limits in parenchymal echogenicity.  IMPRESSION: Cannot rule out a tumor within the inferior right hepatic lobe in the area of abnormal CT. Further assessment with contrast enhanced MRI is recommended. If the patient cannot tolerate MRI then dedicated contrast enhanced liver CT would be recommended.   Electronically Signed   By: Kerby Moors M.D.   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 #2. Abdominal discomfort with urinary frequency but no evidence of kidney stone, ultrasound suggestive of a possible metastasis in the liver. #3. Mental retardation with dementia, functioning well in a group home. #4. History of prostate cancer, status post TURP, now with urinary symptomatology with multiple bacterial morphotypes seen on culture,  greater than 100,000 colonies per milliliter. No organisms were predominant.   PLAN:  #1. Continue Xeloda 1000 mg twice a day for 7 days on and 7 days off, currently on day 4 of a one-week cycle of treatment. #2. Followup in one month with CBC, chem profile, and CEA.   All questions were answered. The patient knows to call the clinic with any problems, questions or concerns. We can certainly see the patient much sooner if necessary.   I spent 25 minutes counseling the patient face to face. The total time spent in the appointment was 30 minutes.    Doroteo Bradford, MD 06/20/2013 12:58 PM

## 2013-06-20 NOTE — Patient Instructions (Signed)
[facility] Discharge Instructions  RECOMMENDATIONS MADE BY THE CONSULTANT AND ANY TEST RESULTS WILL BE SENT TO YOUR REFERRING PHYSICIAN.  Return in 4 weeks for blood work and follow-up appointment with the doctor.  Thank you for choosing [facility] to provide your oncology and hematology care.  To afford each patient quality time with our providers, please arrive at least 15 minutes before your scheduled appointment time.  With your help, our goal is to use those 15 minutes to complete the necessary work-up to ensure our physicians have the information they need to help with your evaluation and healthcare recommendations.    Effective January 1st, 2014, we ask that you re-schedule your appointment with our physicians should you arrive 10 or more minutes late for your appointment.  We strive to give you quality time with our providers, and arriving late affects you and other patients whose appointments are after yours.    Again, thank you for choosing [facility].  Our hope is that these requests will decrease the amount of time that you wait before being seen by our physicians.       _____________________________________________________________  Should you have questions after your visit to [facility], please contact our office at [phone] between the hours of 8:30 a.m. and 5:00 p.m.  Voicemails left after 4:30 p.m. will not be returned until the following business day.  For prescription refill requests, have your pharmacy contact our office with your prescription refill request.

## 2013-06-20 NOTE — Progress Notes (Signed)
Labs drawn today for cmp,cea,cbc/diff

## 2013-06-21 LAB — CEA: CEA: 12.2 ng/mL — ABNORMAL HIGH (ref 0.0–5.0)

## 2013-06-22 ENCOUNTER — Other Ambulatory Visit (HOSPITAL_COMMUNITY): Payer: Medicare Other

## 2013-06-29 ENCOUNTER — Other Ambulatory Visit: Payer: Self-pay

## 2013-06-29 MED ORDER — HYDROCHLOROTHIAZIDE 12.5 MG PO CAPS: 12.5000 mg | ORAL_CAPSULE | Freq: Every day | ORAL | Status: DC

## 2013-07-03 DIAGNOSIS — R634 Abnormal weight loss: Secondary | ICD-10-CM | POA: Insufficient documentation

## 2013-07-03 DIAGNOSIS — K859 Acute pancreatitis without necrosis or infection, unspecified: Secondary | ICD-10-CM | POA: Insufficient documentation

## 2013-07-03 NOTE — Assessment & Plan Note (Signed)
Start ensure supplements . Loss of weight may due to both physical disease , colon ca, as well as mental health issues, will add megace, short term, if no improvement on return

## 2013-07-03 NOTE — Assessment & Plan Note (Signed)
Elevated enz on intial presentation at the [facility], still symptomatic , rept testing shows even greater elkevation in lipase, advised liquid diet for an adiitional 3 days as tolerated

## 2013-07-03 NOTE — Assessment & Plan Note (Signed)
Pt is s/p TURP for prostate cancer , symptom control is fait on medication, he is followed by urology and needs to continue this medication

## 2013-07-03 NOTE — Assessment & Plan Note (Signed)
Increased , por appetite reported  With 18 pound weight loss in 3 month, may benefit from weight gaining agent , also , psych may opt to inc meds, since pt has MR and is unable to express himself somewhat challenging to evaluate. Continue with treatment by psychiatry, no behavioral issues currently

## 2013-07-03 NOTE — Assessment & Plan Note (Signed)
Controlled, no change in medication  

## 2013-07-03 NOTE — Progress Notes (Signed)
   Subjective:    Patient ID: Xavier Tanner, male    DOB: Sep 18, 1950, 63 y.o.   MRN: [id number]  HPI The PT is here for follow up and re-evaluation of chronic medical conditions, medication management and review of any available recent lab and radiology data.  Preventive health is updated, specifically   Immunization.   POt noted to have progressive weight loss and poor appetite. Recently seen iu  ED for abdominal pain, no major cause determined at that time, except slight pancreatitis. Tayvian still c/p pain though less, and his appetite is not good, will rept enzymes and order US liver where he points to painful area No h/o vomit , loose stool, fever or chills No h/o uncontrolled behavior or symptom suggestive of increased depression  \   Review of Systems See HPI, history from facility staff, pt has expressive aphasia Denies recent fever or chills. Denies sinus pressure, nasal congestion, or sore throat. Denies chest congestion, productive cough or wheezing. Denies chest pains, palpitations and leg swelling Denies dysuria, frequency, hesitancy or incontinence. Denies joint pain, swelling and limitation in mobility. Denies headaches,   Denies skin break down or rash.        Objective:   Physical Exam BP 108/78  Pulse 76  Resp 18  Wt 142 lb (64.411 kg)  SpO2 100% Patient alert and oriented and in no cardiopulmonary distress.  HEENT: No facial asymmetry, EOMI, no sinus tenderness,  oropharynx pink and moist.  Neck supple no adenopathy.  Chest: Clear to auscultation bilaterally.  CVS: S1, S2 no murmurs, no S3.  ABD: Soft RUQ tender, no guarding or rebound, no organomegaly or palpable mass, Bs normal Ext: No edema  MS: Adequate ROM spine, shoulders, hips and knees.  Skin: Intact, no ulcerations or rash noted.  Psych: Good eye contact,  not anxious or depressed appearing.  CNS: CN 2-12 intact, power,  normal throughout.        Assessment & Plan:  RUQ  pain Obtain US liver, pt has colon cancer and poor appetite, start ensure  Depression Increased , por appetite reported  With 18 pound weight loss in 3 month, may benefit from weight gaining agent , also , psych may opt to inc meds, since pt has MR and is unable to express himself somewhat challenging to evaluate. Continue with treatment by psychiatry, no behavioral issues currently  URINARY INCONTINENCE Pt is s/p TURP for prostate cancer , symptom control is fait on medication, he is followed by urology and needs to continue this medication  HYPERLIPIDEMIA Controlled, no change in medication   Type 2 diabetes, diet controlled Dietary management only at thsi time  HYPERTENSION Controlled, no change in medication   Weight loss, unintentional Start ensure supplements . Loss of weight may due to both physical disease , colon ca, as well as mental health issues, will add megace, short term, if no improvement on return  Colon cancer, ascending On xeloda since 03/2013 , has upcoming appt for re eval  Pancreatitis Elevated enz on intial presentation at the Ed, still symptomatic , rept testing shows even greater elkevation in lipase, advised liquid diet for an adiitional 3 days as tolerated

## 2013-07-03 NOTE — Assessment & Plan Note (Signed)
Obtain US liver, pt has colon cancer and poor appetite, start ensure

## 2013-07-03 NOTE — Assessment & Plan Note (Signed)
Dietary management only at thsi time

## 2013-07-03 NOTE — Assessment & Plan Note (Signed)
On xeloda since 03/2013 , has upcoming appt for re eval

## 2013-07-21 ENCOUNTER — Encounter (HOSPITAL_COMMUNITY): Payer: Medicare Other | Attending: Hematology and Oncology

## 2013-07-21 ENCOUNTER — Ambulatory Visit: Payer: Medicare Other | Admitting: Family Medicine

## 2013-07-21 ENCOUNTER — Encounter (HOSPITAL_COMMUNITY): Payer: Self-pay

## 2013-07-21 ENCOUNTER — Encounter (HOSPITAL_BASED_OUTPATIENT_CLINIC_OR_DEPARTMENT_OTHER): Payer: Medicare Other

## 2013-07-21 ENCOUNTER — Other Ambulatory Visit: Payer: Self-pay | Admitting: Family Medicine

## 2013-07-21 VITALS — BP 131/61 | HR 75 | Temp 97.9°F | Resp 20 | Wt 142.2 lb

## 2013-07-21 DIAGNOSIS — C182 Malignant neoplasm of ascending colon: Secondary | ICD-10-CM | POA: Insufficient documentation

## 2013-07-21 DIAGNOSIS — F99 Mental disorder, not otherwise specified: Secondary | ICD-10-CM

## 2013-07-21 DIAGNOSIS — K769 Liver disease, unspecified: Secondary | ICD-10-CM

## 2013-07-21 DIAGNOSIS — R350 Frequency of micturition: Secondary | ICD-10-CM

## 2013-07-21 DIAGNOSIS — F79 Unspecified intellectual disabilities: Secondary | ICD-10-CM

## 2013-07-21 DIAGNOSIS — R160 Hepatomegaly, not elsewhere classified: Secondary | ICD-10-CM

## 2013-07-21 LAB — CEA: CEA: 16.8 ng/mL — AB (ref 0.0–5.0)

## 2013-07-21 LAB — CBC WITH DIFFERENTIAL/PLATELET
Basophils Absolute: 0 10*3/uL (ref 0.0–0.1)
Basophils Relative: 0 % (ref 0–1)
Eosinophils Absolute: 0 10*3/uL (ref 0.0–0.7)
Eosinophils Relative: 0 % (ref 0–5)
HCT: 31.4 % — ABNORMAL LOW (ref 39.0–52.0)
Hemoglobin: 10 g/dL — ABNORMAL LOW (ref 13.0–17.0)
LYMPHS ABS: 1.5 10*3/uL (ref 0.7–4.0)
LYMPHS PCT: 26 % (ref 12–46)
MCH: 23.9 pg — ABNORMAL LOW (ref 26.0–34.0)
MCHC: 31.8 g/dL (ref 30.0–36.0)
MCV: 74.9 fL — ABNORMAL LOW (ref 78.0–100.0)
Monocytes Absolute: 0.3 10*3/uL (ref 0.1–1.0)
Monocytes Relative: 6 % (ref 3–12)
NEUTROS PCT: 67 % (ref 43–77)
Neutro Abs: 3.8 10*3/uL (ref 1.7–7.7)
PLATELETS: 231 10*3/uL (ref 150–400)
RBC: 4.19 MIL/uL — AB (ref 4.22–5.81)
RDW: 24.7 % — ABNORMAL HIGH (ref 11.5–15.5)
WBC: 5.6 10*3/uL (ref 4.0–10.5)

## 2013-07-21 LAB — COMPREHENSIVE METABOLIC PANEL
ALT: 11 U/L (ref 0–53)
AST: 20 U/L (ref 0–37)
Albumin: 3.6 g/dL (ref 3.5–5.2)
Alkaline Phosphatase: 62 U/L (ref 39–117)
BILIRUBIN TOTAL: 0.9 mg/dL (ref 0.3–1.2)
BUN: 20 mg/dL (ref 6–23)
CALCIUM: 9 mg/dL (ref 8.4–10.5)
CHLORIDE: 102 meq/L (ref 96–112)
CO2: 26 meq/L (ref 19–32)
Creatinine, Ser: 0.69 mg/dL (ref 0.50–1.35)
GFR calc Af Amer: >90 mL/min (ref >90)
Glucose, Bld: 109 mg/dL — ABNORMAL HIGH (ref 70–99)
Potassium: 4.3 mEq/L (ref 3.7–5.3)
SODIUM: 138 meq/L (ref 137–147)
Total Protein: 7.5 g/dL (ref 6.0–8.3)

## 2013-07-21 NOTE — Progress Notes (Signed)
Labs drawn today for cbc/diff,cea,cmp

## 2013-07-21 NOTE — Patient Instructions (Signed)
[facility] Discharge Instructions  RECOMMENDATIONS MADE BY THE CONSULTANT AND ANY TEST RESULTS WILL BE SENT TO YOUR REFERRING PHYSICIAN.  EXAM FINDINGS BY THE PHYSICIAN TODAY AND SIGNS OR SYMPTOMS TO REPORT TO CLINIC OR PRIMARY PHYSICIAN: Exam and findings as discussed by Dr.Formanek.  MEDICATIONS PRESCRIBED:  Continue all as prescribed.  INSTRUCTIONS/FOLLOW-UP: Ultrasound 07/25/13. We will call if there are any abnormal results. Return to clinic in 2 months for lab work prior to MD appointment. Report any issues/concerns to clinic as needed prior to appointments.  Thank you for choosing [facility] to provide your oncology and hematology care.  To afford each patient quality time with our providers, please arrive at least 15 minutes before your scheduled appointment time.  With your help, our goal is to use those 15 minutes to complete the necessary work-up to ensure our physicians have the information they need to help with your evaluation and healthcare recommendations.    Effective January 1st, 2014, we ask that you re-schedule your appointment with our physicians should you arrive 10 or more minutes late for your appointment.  We strive to give you quality time with our providers, and arriving late affects you and other patients whose appointments are after yours.    Again, thank you for choosing [facility].  Our hope is that these requests will decrease the amount of time that you wait before being seen by our physicians.       _____________________________________________________________  Should you have questions after your visit to [facility], please contact our office at [phone] between the hours of 8:30 a.m. and 5:00 p.m.  Voicemails left after 4:30 p.m. will not be returned until the following business day.  For prescription refill requests, have your pharmacy contact our office with your prescription refill  request.

## 2013-07-21 NOTE — Progress Notes (Signed)
[facility]  OFFICE PROGRESS NOTE  Tula Nakayama, MD [address] [place] [address]  DIAGNOSIS: Colon cancer, ascending  Liver mass - Plan: US Abdomen Limited  No chief complaint on file.   CURRENT THERAPY: Xeloda 1000 mg twice a day for 14 days out of every 21 having been started on 03/24/2013 change to 7 days on and 7 days off on 05/23/2013 due to palmar-plantar erythrodysesthesia with definitive surgery for stage II colon cancer performed on 12/15/2012 demonstrating microsatellite stability with a 19% chance of recurrence on Oncotype DX colon.  INTERVAL HISTORY: Xavier Tanner 63 y.o. male returns for followup while taking Xeloda 7 days on and 7 days off for stage II colon cancer.  He had developed abdominal pain in early March 2015 and was diagnosed with pancreatitis. Manager was conservative which is liquid diet. Currently denies any abdominal pain, back pain, nausea, vomiting, diarrhea, lower extremity swelling or redness, PND, orthopnea, palpitations, headache, or seizures. According to his caretaker he appears to be slightly more demented of late.  MEDICAL HISTORY: Past Medical History  Diagnosis Date  . Urinary incontinence   . Psychotic disorder   . Diabetes mellitus, type 2   . Hypertension   . Bilateral cataracts 2011  . Vitamin D deficiency   . Mental retardation   . Depression   . Microcytic anemia 12/03/2012  . HOH (hard of hearing)   . Prostate cancer   . 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    INTERIM HISTORY: has ONYCHOMYCOSIS, TOENAILS; Unspecified vitamin D deficiency; HYPERLIPIDEMIA; Unspecified psychosis; HYPERTENSION; FATIGUE; URINARY INCONTINENCE; TINEA CRURIS; Tremor; Mental retardation; Hypokalemia; Microcytic anemia; Colon cancer, ascending; Type 2 diabetes, diet controlled; Depression; Anemia due to chronic blood loss; Dermatomycosis; Routine general medical examination at a  health care facility; RUQ pain; Weight loss, unintentional; and Pancreatitis on his problem list.    ALLERGIES:  is allergic to ace inhibitors.  MEDICATIONS: has a current medication list which includes the following prescription(s): alum & mag hydroxide-simeth, benzonatate, bisacodyl, capecitabine, citalopram, diphenhydramine, diphenoxylate-atropine, fluticasone, gabapentin, guaifenesin, hydrochlorothiazide, hydrocodone-acetaminophen, hydrocortisone cream, loperamide, magnesium hydroxide, memantine hcl er, omeprazole, ondansetron, potassium chloride, pravastatin, solifenacin, tamsulosin, and terbinafine.  SURGICAL HISTORY:  Past Surgical History  Procedure Laterality Date  . Hemorroidectomy  2002  . Transurethral resection of prostate  July 09, 2007  . Colonoscopy N/A 12/04/2012    Procedure: COLONOSCOPY;  Surgeon: Daneil Dolin, MD;  Location: [facility];  Service: Endoscopy;  Laterality: N/A;  . Esophagogastroduodenoscopy N/A 12/04/2012    Procedure: ESOPHAGOGASTRODUODENOSCOPY (EGD);  Surgeon: Daneil Dolin, MD;  Location: [facility];  Service: Endoscopy;  Laterality: N/A;  . Colon resection N/A 12/15/2012    Procedure: HAND ASSISTED LAPAROSCOPIC PARTIAL COLECTOMY;  Surgeon: Donato Heinz, MD;  Location: [facility];  Service: General;  Laterality: N/A;  . Colon resection N/A 12/15/2012    Procedure: COLON RESECTION;  Surgeon: Donato Heinz, MD;  Location: [facility];  Service: General;  Laterality: N/A;    FAMILY HISTORY: family history includes Diabetes in his mother; Hypertension in his mother; Mental illness in his mother; Stroke in his father.  SOCIAL HISTORY:  reports that he has never smoked. He has never used smokeless tobacco. He reports that he does not drink alcohol or use illicit drugs.  REVIEW OF SYSTEMS:  Other than that discussed above is noncontributory.  PHYSICAL EXAMINATION: ECOG PERFORMANCE STATUS: 2 -  Symptomatic, <50% confined to bed  There were no vitals taken for  this visit.  GENERAL:alert, no distress and comfortable SKIN: skin color, texture, turgor are normal, no rashes or significant lesions EYES: PERLA; Conjunctiva are pink and non-injected, sclera clear SINUSES: No redness or tenderness over maxillary or ethmoid sinuses OROPHARYNX:no exudate, no erythema on lips, buccal mucosa, or tongue. NECK: supple, thyroid normal size, non-tender, without nodularity. No masses CHEST: Normal AP diameter with no gynecomastia. LYMPH:  no palpable lymphadenopathy in the cervical, axillary or inguinal LUNGS: clear to auscultation and percussion with normal breathing effort HEART: regular rate & rhythm and no murmurs. ABDOMEN:abdomen soft, non-tender and normal bowel sounds MUSCULOSKELETAL:no cyanosis of digits and no clubbing. Range of motion normal. Hyperpigmented changes to the palms and soles without desquamation. NEURO: Answer simple questions appropriately but often is very little spontaneous communication.   LABORATORY DATA: No visits with results within 30 Day(s) from this visit. Latest known visit with results is:  Infusion on 06/20/2013  Component Date Value Ref Range Status  . WBC 06/20/2013 6.4  4.0 - 10.5 K/uL Final  . RBC 06/20/2013 4.82  4.22 - 5.81 MIL/uL Final  . Hemoglobin 06/20/2013 11.2* 13.0 - 17.0 g/dL Final  . HCT 06/20/2013 35.0* 39.0 - 52.0 % Final  . MCV 06/20/2013 72.6* 78.0 - 100.0 fL Final  . MCH 06/20/2013 23.2* 26.0 - 34.0 pg Final  . MCHC 06/20/2013 32.0  30.0 - 36.0 g/dL Final  . RDW 06/20/2013 24.3* 11.5 - 15.5 % Final  . Platelets 06/20/2013 291  150 - 400 K/uL Final  . Neutrophils Relative % 06/20/2013 64  43 - 77 % Final  . Neutro Abs 06/20/2013 4.1  1.7 - 7.7 K/uL Final  . Lymphocytes Relative 06/20/2013 31  12 - 46 % Final  . Lymphs Abs 06/20/2013 2.0  0.7 - 4.0 K/uL Final  . Monocytes Relative 06/20/2013 5  3 - 12 % Final  . Monocytes Absolute 06/20/2013 0.3  0.1 - 1.0 K/uL Final  . Eosinophils Relative  06/20/2013 0  0 - 5 % Final  . Eosinophils Absolute 06/20/2013 0.0  0.0 - 0.7 K/uL Final  . Basophils Relative 06/20/2013 0  0 - 1 % Final  . Basophils Absolute 06/20/2013 0.0  0.0 - 0.1 K/uL Final  . CEA 06/20/2013 12.2* 0.0 - 5.0 ng/mL Final   Performed at [facility]  . Sodium 06/20/2013 140  137 - 147 mEq/L Final  . Potassium 06/20/2013 3.8  3.7 - 5.3 mEq/L Final  . Chloride 06/20/2013 102  96 - 112 mEq/L Final  . CO2 06/20/2013 27  19 - 32 mEq/L Final  . Glucose, Bld 06/20/2013 90  70 - 99 mg/dL Final  . BUN 06/20/2013 16  6 - 23 mg/dL Final  . Creatinine, Ser 06/20/2013 0.82  0.50 - 1.35 mg/dL Final  . Calcium 06/20/2013 9.4  8.4 - 10.5 mg/dL Final  . Total Protein 06/20/2013 7.8  6.0 - 8.3 g/dL Final  . Albumin 06/20/2013 3.9  3.5 - 5.2 g/dL Final  . AST 06/20/2013 22  0 - 37 U/L Final  . ALT 06/20/2013 11  0 - 53 U/L Final  . Alkaline Phosphatase 06/20/2013 57  39 - 117 U/L Final  . Total Bilirubin 06/20/2013 1.1  0.3 - 1.2 mg/dL Final  . GFR calc non Af Amer 06/20/2013 >90  >90 mL/min Final  . GFR calc Af Amer 06/20/2013 >90  >90 mL/min Final   Comment: (NOTE)  The eGFR has been calculated using the CKD EPI equation.                          This calculation has not been validated in all clinical situations.                          eGFR's persistently <90 mL/min signify possible Chronic Kidney                          Disease.    PATHOLOGY: No new pathology.  Urinalysis    Component Value Date/Time   COLORURINE YELLOW 06/11/2013 1635   APPEARANCEUR HAZY* 06/11/2013 1635   LABSPEC 1.010 06/11/2013 1635   PHURINE 5.5 06/11/2013 1635   GLUCOSEU NEGATIVE 06/11/2013 1635   HGBUR MODERATE* 06/11/2013 1635   BILIRUBINUR NEGATIVE 06/11/2013 1635   BILIRUBINUR neg 07/21/2012 1544   KETONESUR NEGATIVE 06/11/2013 1635   PROTEINUR NEGATIVE 06/11/2013 1635   PROTEINUR neg 07/21/2012 1544   UROBILINOGEN 0.2 06/11/2013 1635   UROBILINOGEN 0.2 07/21/2012 1544     NITRITE NEGATIVE 06/11/2013 1635   NITRITE neg 07/21/2012 1544   LEUKOCYTESUR SMALL* 06/11/2013 1635    RADIOGRAPHIC STUDIES: No results found.  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  #2. Abdominal discomfort with urinary frequency but no evidence of kidney stone, ultrasound suggestive of a possible metastasis in the liver.  #3. Mental retardation with dementia, functioning well in a group home.  #4. History of prostate cancer, status post TURP, now with urinary symptomatology with multiple bacterial morphotypes seen on culture, greater than 100,000 colonies per milliliter. No organisms were predominant. #5. Abnormal CT scan of the liver. #6. Recent episode of pancreatitis treated conservatively. May be responsible for elevated CEA.    PLAN:  #1. Ultrasound of the liver on 07/25/2013 to further delineate the abnormality found on CT scan. #2. Continue Xeloda 1000 mg twice a day for 7 days on and 7 days off. #3. Followup in 2 months with CBC and CEA.   All questions were answered. The patient knows to call the clinic with any problems, questions or concerns. We can certainly see the patient much sooner if necessary.   I spent 25 minutes counseling the patient face to face. The total time spent in the appointment was 30 minutes.    Farrel Gobble, MD 07/21/2013 9:49 AM

## 2013-07-25 ENCOUNTER — Other Ambulatory Visit (HOSPITAL_COMMUNITY): Payer: Self-pay | Admitting: Hematology and Oncology

## 2013-07-25 ENCOUNTER — Ambulatory Visit (HOSPITAL_COMMUNITY)
Admission: RE | Admit: 2013-07-25 | Discharge: 2013-07-25 | Disposition: A | Discharge status: Home / Self Care | Payer: Medicare Other | Source: Ambulatory Visit | Attending: Hematology and Oncology | Admitting: Hematology and Oncology

## 2013-07-25 DIAGNOSIS — R160 Hepatomegaly, not elsewhere classified: Secondary | ICD-10-CM

## 2013-07-25 DIAGNOSIS — Z85038 Personal history of other malignant neoplasm of large intestine: Secondary | ICD-10-CM | POA: Insufficient documentation

## 2013-07-25 DIAGNOSIS — R935 Abnormal findings on diagnostic imaging of other abdominal regions, including retroperitoneum: Secondary | ICD-10-CM | POA: Insufficient documentation

## 2013-07-25 DIAGNOSIS — K769 Liver disease, unspecified: Secondary | ICD-10-CM | POA: Insufficient documentation

## 2013-07-29 ENCOUNTER — Other Ambulatory Visit (HOSPITAL_COMMUNITY): Payer: Self-pay | Admitting: Oncology

## 2013-07-29 DIAGNOSIS — C182 Malignant neoplasm of ascending colon: Secondary | ICD-10-CM

## 2013-07-29 MED ORDER — CAPECITABINE 500 MG PO TABS: 625.0000 mg/m2 | ORAL_TABLET | Freq: Two times a day (BID) | ORAL | Status: DC

## 2013-08-18 ENCOUNTER — Ambulatory Visit (INDEPENDENT_AMBULATORY_CARE_PROVIDER_SITE_OTHER): Payer: Medicare Other | Admitting: Family Medicine

## 2013-08-18 ENCOUNTER — Encounter: Payer: Self-pay | Admitting: Family Medicine

## 2013-08-18 ENCOUNTER — Encounter (INDEPENDENT_AMBULATORY_CARE_PROVIDER_SITE_OTHER): Payer: Self-pay

## 2013-08-18 VITALS — BP 120/82 | HR 81 | Resp 16 | Wt 148.1 lb

## 2013-08-18 DIAGNOSIS — Z8546 Personal history of malignant neoplasm of prostate: Secondary | ICD-10-CM

## 2013-08-18 DIAGNOSIS — F3289 Other specified depressive episodes: Secondary | ICD-10-CM

## 2013-08-18 DIAGNOSIS — E119 Type 2 diabetes mellitus without complications: Secondary | ICD-10-CM

## 2013-08-18 DIAGNOSIS — E785 Hyperlipidemia, unspecified: Secondary | ICD-10-CM

## 2013-08-18 DIAGNOSIS — F329 Major depressive disorder, single episode, unspecified: Secondary | ICD-10-CM

## 2013-08-18 DIAGNOSIS — F32A Depression, unspecified: Secondary | ICD-10-CM

## 2013-08-18 DIAGNOSIS — R32 Unspecified urinary incontinence: Secondary | ICD-10-CM

## 2013-08-18 DIAGNOSIS — I10 Essential (primary) hypertension: Secondary | ICD-10-CM

## 2013-08-18 DIAGNOSIS — C182 Malignant neoplasm of ascending colon: Secondary | ICD-10-CM

## 2013-08-18 NOTE — Patient Instructions (Addendum)
F/u in 3 month, call if you need me before  Fasting lipid, cmp and EGFr, hBa1C in 11/2013 before visit   Keep on singing   Swelling is being checked with scans and ultrasound , and this was just done in April by oncology

## 2013-08-21 ENCOUNTER — Encounter: Payer: Self-pay | Admitting: Family Medicine

## 2013-08-21 DIAGNOSIS — Z8546 Personal history of malignant neoplasm of prostate: Secondary | ICD-10-CM | POA: Insufficient documentation

## 2013-08-21 NOTE — Assessment & Plan Note (Signed)
Diet controlled continue same Updated lab needed at/ before next visit.

## 2013-08-21 NOTE — Assessment & Plan Note (Signed)
Has h/o recureent uTI and is followed by urology

## 2013-08-21 NOTE — Assessment & Plan Note (Signed)
Controlled, no change in medication  

## 2013-08-21 NOTE — Assessment & Plan Note (Signed)
Stable managed by psychiatry at [facility]

## 2013-08-21 NOTE — Assessment & Plan Note (Addendum)
Controlled, no change in medication  

## 2013-08-21 NOTE — Assessment & Plan Note (Signed)
Recent abdominal scan suggestive of liver mets also CEA has increased, pt has recently completed  chemo for colon ca

## 2013-08-21 NOTE — Progress Notes (Signed)
   Subjective:    Patient ID: Xavier Tanner, male    DOB: November 23, 1950, 63 y.o.   MRN: [id number]  HPI The PT is here for follow up and re-evaluation of chronic medical conditions, medication management and review of any available recent lab and radiology data.  Preventive health is updated, specifically  Cancer screening and Immunization.   C/o enlarging swelling in abdomen in past several weeks, recent scan suggests possible liver mets, and recent CEA has increased slightly. Reports good appetite and regular BM and is being followed closely by oncology       Review of Systems See HPI Denies recent fever or chills. Denies sinus pressure, nasal congestion, ear pain or sore throat. Denies chest congestion, productive cough or wheezing. Denies chest pains, palpitations and leg swelling Denies abdominal pain, nausea, vomiting,diarrhea or constipation.   Denies dysuria, frequency, hesitancy or incontinence. Denies joint pain, swelling and limitation in mobility. Denies headaches, seizures, numbness, or tingling. Denies uncontrolled  Depression or  anxiety ongoing  insomnia. Denies skin break down or rash.        Objective:   Physical Exam BP 120/82  Pulse 81  Resp 16  Wt 148 lb 1.9 oz (67.187 kg)  SpO2 100% Patient alert and oriented and in no cardiopulmonary distress.  HEENT: No facial asymmetry, EOMI, no sinus tenderness,  oropharynx pink and moist.  Neck supple no adenopathy.  Chest: Clear to auscultation bilaterally.  CVS: S1, S2 no murmurs, no S3.  ABD: Soft non tender.Possible enlargement of liver. Bowel sounds normal.  Ext: No edema  MS: Adequate ROM spine, shoulders, hips and knees.  Skin: Intact, no ulcerations or rash noted.  Psych: Good eye contact, blunted  affect. Memory unable to assess, mentally challenged not anxious or depressed appearing.  CNS: CN 2-12 intact, power, tone and sensation normal throughout.        Assessment & Plan:  Colon  cancer, ascending Recent abdominal scan suggestive of liver mets also CEA has increased, pt has recently completed  chemo for colon ca   HYPERTENSION Controlled, no change in medication   Type 2 diabetes, diet controlled Diet controlled continue same Updated lab needed at/ before next visit.   Depression Stable managed by psychiatry at [facility] Controlled, no change in medication   H/O prostate cancer Has h/o recureent uTI and is followed by urology

## 2013-08-23 ENCOUNTER — Emergency Department (HOSPITAL_COMMUNITY): Payer: Medicare Other

## 2013-08-23 ENCOUNTER — Emergency Department (HOSPITAL_COMMUNITY)
Admission: EM | Admit: 2013-08-23 | Discharge: 2013-08-23 | Disposition: A | Discharge status: Home / Self Care | Payer: Medicare Other | Attending: Emergency Medicine | Admitting: Emergency Medicine

## 2013-08-23 ENCOUNTER — Encounter (HOSPITAL_COMMUNITY): Payer: Self-pay | Admitting: Emergency Medicine

## 2013-08-23 DIAGNOSIS — H9190 Unspecified hearing loss, unspecified ear: Secondary | ICD-10-CM | POA: Diagnosis not present

## 2013-08-23 DIAGNOSIS — Z9889 Other specified postprocedural states: Secondary | ICD-10-CM | POA: Diagnosis not present

## 2013-08-23 DIAGNOSIS — Z862 Personal history of diseases of the blood and blood-forming organs and certain disorders involving the immune mechanism: Secondary | ICD-10-CM | POA: Insufficient documentation

## 2013-08-23 DIAGNOSIS — Z79899 Other long term (current) drug therapy: Secondary | ICD-10-CM | POA: Insufficient documentation

## 2013-08-23 DIAGNOSIS — F3289 Other specified depressive episodes: Secondary | ICD-10-CM | POA: Insufficient documentation

## 2013-08-23 DIAGNOSIS — F329 Major depressive disorder, single episode, unspecified: Secondary | ICD-10-CM | POA: Insufficient documentation

## 2013-08-23 DIAGNOSIS — I10 Essential (primary) hypertension: Secondary | ICD-10-CM | POA: Insufficient documentation

## 2013-08-23 DIAGNOSIS — E119 Type 2 diabetes mellitus without complications: Secondary | ICD-10-CM | POA: Insufficient documentation

## 2013-08-23 DIAGNOSIS — IMO0002 Reserved for concepts with insufficient information to code with codable children: Secondary | ICD-10-CM | POA: Diagnosis not present

## 2013-08-23 DIAGNOSIS — K5900 Constipation, unspecified: Secondary | ICD-10-CM | POA: Diagnosis not present

## 2013-08-23 DIAGNOSIS — Z9049 Acquired absence of other specified parts of digestive tract: Secondary | ICD-10-CM | POA: Insufficient documentation

## 2013-08-23 DIAGNOSIS — Z8669 Personal history of other diseases of the nervous system and sense organs: Secondary | ICD-10-CM | POA: Diagnosis not present

## 2013-08-23 DIAGNOSIS — K439 Ventral hernia without obstruction or gangrene: Secondary | ICD-10-CM | POA: Insufficient documentation

## 2013-08-23 DIAGNOSIS — R109 Unspecified abdominal pain: Secondary | ICD-10-CM | POA: Diagnosis present

## 2013-08-23 DIAGNOSIS — Z8546 Personal history of malignant neoplasm of prostate: Secondary | ICD-10-CM | POA: Diagnosis not present

## 2013-08-23 DIAGNOSIS — Z85038 Personal history of other malignant neoplasm of large intestine: Secondary | ICD-10-CM | POA: Diagnosis not present

## 2013-08-23 DIAGNOSIS — N390 Urinary tract infection, site not specified: Secondary | ICD-10-CM

## 2013-08-23 DIAGNOSIS — F0390 Unspecified dementia without behavioral disturbance: Secondary | ICD-10-CM | POA: Diagnosis not present

## 2013-08-23 HISTORY — DX: Unspecified dementia, unspecified severity, without behavioral disturbance, psychotic disturbance, mood disturbance, and anxiety: F03.90

## 2013-08-23 LAB — COMPREHENSIVE METABOLIC PANEL WITH GFR
ALT: 17 U/L (ref 0–53)
AST: 21 U/L (ref 0–37)
Albumin: 3.3 g/dL — ABNORMAL LOW (ref 3.5–5.2)
Alkaline Phosphatase: 60 U/L (ref 39–117)
BUN: 20 mg/dL (ref 6–23)
CO2: 29 meq/L (ref 19–32)
Calcium: 9.3 mg/dL (ref 8.4–10.5)
Chloride: 103 meq/L (ref 96–112)
Creatinine, Ser: 0.88 mg/dL (ref 0.50–1.35)
GFR calc Af Amer: >90 mL/min
GFR calc non Af Amer: 89 mL/min — ABNORMAL LOW
Glucose, Bld: 96 mg/dL (ref 70–99)
Potassium: 3.9 meq/L (ref 3.7–5.3)
Sodium: 140 meq/L (ref 137–147)
Total Bilirubin: 0.6 mg/dL (ref 0.3–1.2)
Total Protein: 6.8 g/dL (ref 6.0–8.3)

## 2013-08-23 LAB — LIPASE, BLOOD: LIPASE: 36 U/L (ref 11–59)

## 2013-08-23 LAB — URINE MICROSCOPIC-ADD ON

## 2013-08-23 LAB — CBC WITH DIFFERENTIAL/PLATELET
Basophils Absolute: 0 10*3/uL (ref 0.0–0.1)
Basophils Relative: 0 % (ref 0–1)
EOS PCT: 1 % (ref 0–5)
Eosinophils Absolute: 0 10*3/uL (ref 0.0–0.7)
HEMATOCRIT: 30.6 % — AB (ref 39.0–52.0)
Hemoglobin: 9.9 g/dL — ABNORMAL LOW (ref 13.0–17.0)
LYMPHS ABS: 1.3 10*3/uL (ref 0.7–4.0)
Lymphocytes Relative: 28 % (ref 12–46)
MCH: 24.6 pg — ABNORMAL LOW (ref 26.0–34.0)
MCHC: 32.4 g/dL (ref 30.0–36.0)
MCV: 76.1 fL — AB (ref 78.0–100.0)
MONO ABS: 0.4 10*3/uL (ref 0.1–1.0)
Monocytes Relative: 8 % (ref 3–12)
NEUTROS ABS: 3 10*3/uL (ref 1.7–7.7)
Neutrophils Relative %: 63 % (ref 43–77)
Platelets: 183 10*3/uL (ref 150–400)
RBC: 4.02 MIL/uL — AB (ref 4.22–5.81)
RDW: 24.1 % — ABNORMAL HIGH (ref 11.5–15.5)
WBC: 4.8 10*3/uL (ref 4.0–10.5)

## 2013-08-23 LAB — URINALYSIS, ROUTINE W REFLEX MICROSCOPIC
Bilirubin Urine: NEGATIVE
Glucose, UA: NEGATIVE mg/dL
Hgb urine dipstick: NEGATIVE
Ketones, ur: NEGATIVE mg/dL
Nitrite: POSITIVE — AB
Protein, ur: NEGATIVE mg/dL
Specific Gravity, Urine: <1.005 — ABNORMAL LOW (ref 1.005–1.030)
Urobilinogen, UA: 0.2 mg/dL (ref 0.0–1.0)
pH: 5.5 (ref 5.0–8.0)

## 2013-08-23 LAB — AMMONIA: Ammonia: 18 umol/L (ref 11–60)

## 2013-08-23 LAB — I-STAT CG4 LACTIC ACID, ED: Lactic Acid, Venous: 0.71 mmol/L (ref 0.5–2.2)

## 2013-08-23 MED ORDER — DEXTROSE 5 % IV SOLN
1.0000 g | Freq: Once | INTRAVENOUS | Status: AC
  Administered 2013-08-23: 1 g via INTRAVENOUS
  Filled 2013-08-23: qty 10

## 2013-08-23 MED ORDER — CIPROFLOXACIN HCL 500 MG PO TABS: 500.0000 mg | ORAL_TABLET | Freq: Two times a day (BID) | ORAL | Status: DC

## 2013-08-23 NOTE — ED Provider Notes (Signed)
CSN: [id number]     Arrival date & time 08/23/13  8416 History  This chart was scribed for Merryl Hacker, MD by Eston Mould, ED Scribe. This patient was seen in room [facility] and the patient's care was started at 9:51 AM.  Chief Complaint  Patient presents with  . Abdominal Pain   The history is provided by the spouse. The history is limited by the condition of the patient. No language interpreter was used.  LEVEL 5 CAVEAT-Due to dementia  HPI Comments: Xavier Tanner is a 63 y.o. male with a hx of dementia and depression who presents to the Emergency Department complaining of abd pain. Pts wife states pt was seen by Dr. Moshe Cipro, PCP, 6 days ago and came home normal. Pts wife noticed distention later that afternoon and states pt denies having pain. Wife states pt mental status and verbal abilities are different. She states pt has not been talking compared to baseline and c/o pain but unsure where his pain is.  Denies fever, chills, abd pain, and recent falls or injuries.  Past Medical History  Diagnosis Date  . Urinary incontinence   . Psychotic disorder   . Diabetes mellitus, type 2   . Hypertension   . Bilateral cataracts 2011  . Vitamin D deficiency   . Mental retardation   . Depression   . Microcytic anemia 12/03/2012  . HOH (hard of hearing)   . Prostate cancer   . 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  . Dementia    Past Surgical History  Procedure Laterality Date  . Hemorroidectomy  2002  . Transurethral resection of prostate  July 09, 2007  . Colonoscopy N/A 12/04/2012    Procedure: COLONOSCOPY;  Surgeon: Daneil Dolin, MD;  Location: [facility];  Service: Endoscopy;  Laterality: N/A;  . Esophagogastroduodenoscopy N/A 12/04/2012    Procedure: ESOPHAGOGASTRODUODENOSCOPY (EGD);  Surgeon: Daneil Dolin, MD;  Location: [facility];  Service: Endoscopy;  Laterality: N/A;  . Colon resection N/A 12/15/2012    Procedure: HAND ASSISTED  LAPAROSCOPIC PARTIAL COLECTOMY;  Surgeon: Donato Heinz, MD;  Location: [facility];  Service: General;  Laterality: N/A;  . Colon resection N/A 12/15/2012    Procedure: COLON RESECTION;  Surgeon: Donato Heinz, MD;  Location: [facility];  Service: General;  Laterality: N/A;   Family History  Problem Relation Age of Onset  . Mental illness Mother   . Hypertension Mother   . Diabetes Mother   . Stroke Father    History  Substance Use Topics  . Smoking status: Never Smoker   . Smokeless tobacco: Never Used  . Alcohol Use: No    Review of Systems  Unable to perform ROS: Dementia   Allergies  Ace inhibitors  Home Medications   Prior to Admission medications   Medication Sig Start Date End Date Taking? Authorizing Provider  bisacodyl (DULCOLAX) 5 MG EC tablet Take 5 mg by mouth 3 (three) times daily as needed for constipation.    Historical Provider, MD  capecitabine (XELODA) 500 MG tablet Take 2 tablets (1,000 mg total) by mouth 2 (two) times daily after a meal. 07/29/13   Baird Cancer, PA-C  citalopram (CELEXA) 20 MG tablet Take 30 mg by mouth daily.    Historical Provider, MD  clonazePAM (KLONOPIN) 0.5 MG tablet Take 0.5 mg by mouth 3 (three) times daily as needed for anxiety.    Historical Provider, MD  fluticasone (FLONASE) 50 MCG/ACT nasal  spray Place 1 spray into both nostrils daily.    Historical Provider, MD  gabapentin (NEURONTIN) 100 MG capsule Take 100 mg by mouth at bedtime. May increase up to 3 capsules if needed    Historical Provider, MD  hydrochlorothiazide (MICROZIDE) 12.5 MG capsule Take 1 capsule (12.5 mg total) by mouth daily. 06/29/13   Fayrene Helper, MD  hydrocortisone cream 1 % Apply 1 application topically 3 (three) times daily. Apply to affected area    Historical Provider, MD  magnesium hydroxide (MILK OF MAGNESIA) 400 MG/5ML suspension Take 30 mLs by mouth 3 (three) times daily as needed for constipation (If no bowel movement after 3 days may repeat x3  doses).    Historical Provider, MD  Memantine HCl ER (NAMENDA XR) 28 MG CP24 Take 28 mg by mouth daily.    Historical Provider, MD  omeprazole (PRILOSEC) 20 MG capsule Take 20 mg by mouth daily.    Historical Provider, MD  ondansetron (ZOFRAN) 8 MG tablet Take 1 tablet (8 mg total) by mouth every 8 (eight) hours as needed for nausea or vomiting. 02/23/13   Baird Cancer, PA-C  oxybutynin (DITROPAN-XL) 10 MG 24 hr tablet Take 10 mg by mouth at bedtime.    Historical Provider, MD  pravastatin (PRAVACHOL) 40 MG tablet TAKE 1 TABLET BY MOUTH AT BEDTIME. 07/21/13   Fayrene Helper, MD  QUEtiapine (SEROQUEL XR) 50 MG TB24 24 hr tablet Take 300 mg by mouth daily.    Historical Provider, MD  tamsulosin (FLOMAX) 0.4 MG CAPS capsule Take 0.4 mg by mouth daily.    Historical Provider, MD  terbinafine (LAMISIL) 1 % cream Apply 1 application topically 2 (two) times daily as needed.     Historical Provider, MD   BP 125/100  Pulse 80  Temp(Src) 97.7 F (36.5 C) (Oral)  Resp 18  SpO2 97%  Physical Exam  Nursing note and vitals reviewed. Constitutional: No distress.  HENT:  Head: Normocephalic and atraumatic.  Eyes: Pupils are equal, round, and reactive to light.  Neck: Neck supple.  Cardiovascular: Normal rate, regular rhythm and normal heart sounds.   No murmur heard. Pulmonary/Chest: Effort normal and breath sounds normal. No respiratory distress. He has no wheezes.  Abdominal: Soft. Bowel sounds are normal. He exhibits distension. There is no tenderness. There is no rebound.  Ventral hernia noted, reducible  Musculoskeletal: He exhibits no edema.  Lymphadenopathy:    He has no cervical adenopathy.  Neurological: He is alert.  Oriented to self, moves all 4 extremities, follow simple commands  Skin: Skin is warm and dry.  Psychiatric: He has a normal mood and affect.    ED Course  Procedures  DIAGNOSTIC STUDIES: Oxygen Saturation is 98% on RA, normal by my interpretation.     COORDINATION OF CARE: 9:56 AM-Discussed treatment plan which includes labs and X-rays. Spouse of pt agreed to plan.   Labs Review Labs Reviewed  CBC WITH DIFFERENTIAL - Abnormal; Notable for the following:    RBC 4.02 (*)    Hemoglobin 9.9 (*)    HCT 30.6 (*)    MCV 76.1 (*)    MCH 24.6 (*)    RDW 24.1 (*)    All other components within normal limits  COMPREHENSIVE METABOLIC PANEL - Abnormal; Notable for the following:    Albumin 3.3 (*)    GFR calc non Af Amer 89 (*)    All other components within normal limits  URINALYSIS, ROUTINE W REFLEX MICROSCOPIC - Abnormal;  Notable for the following:    Specific Gravity, Urine <1.005 (*)    Nitrite POSITIVE (*)    Leukocytes, UA MODERATE (*)    All other components within normal limits  URINE MICROSCOPIC-ADD ON - Abnormal; Notable for the following:    Squamous Epithelial / LPF FEW (*)    Bacteria, UA FEW (*)    All other components within normal limits  URINE CULTURE  LIPASE, BLOOD  AMMONIA  I-STAT CG4 LACTIC ACID, ED   Imaging Review Dg Abd 1 View  08/23/2013   CLINICAL DATA:  Abdominal distention and pain  EXAM: ABDOMEN - 1 VIEW  COMPARISON:  Prior CT abdomen/ pelvis 06/11/2013  FINDINGS: No large free air on this supine radiograph. Gas and stool are noted throughout the colon. The colonic stool burden is high suggesting underlying constipation. Gas is noted within nondilated loops of small bowel in the mid abdomen. No acute osseous abnormality.  IMPRESSION: 1. Large colonic stool burden extending to the rectum suggests an element of constipation. 2. Nonspecific bowel gas pattern with gas throughout both large and small bowel without definitive dilatation. Findings may be related to underlying constipation, or ileus.   Electronically Signed   By: Jacqulynn Cadet M.D.   On: 08/23/2013 10:54    EKG Interpretation None     MDM   Final diagnoses:  Urinary tract infection  Constipation    Patient presents with his wife with  concerns for abdominal distention and altered mental status. He is nontoxic on exam. He is oriented only to self. He follows simple commands and otherwise appears nonfocal. Workup notable for a KUB that shows a nonobstructive bowel gas pattern with constipation. Also noted to have a nitrite-positive UTI. Patient was given Rocephin and urine culture was sent. Suspect this is the etiology of patient's current symptoms.  No evidence of leukocytosis and no fever. Feel the patient can be discharged and treated as an outpatient.  After history, exam, and medical workup I feel the patient has been appropriately medically screened and is safe for discharge home. Pertinent diagnoses were discussed with the patient. Patient was given return precautions.  I personally performed the services described in this documentation, which was scribed in my presence. The recorded information has been reviewed and is accurate.    Merryl Hacker, MD 08/24/13 [phone]

## 2013-08-23 NOTE — Discharge Instructions (Signed)
Urinary Tract Infection  Urinary tract infections (UTIs) can develop anywhere along your urinary tract. Your urinary tract is your body's drainage system for removing wastes and extra water. Your urinary tract includes two kidneys, two ureters, a bladder, and a urethra. Your kidneys are a pair of bean-shaped organs. Each kidney is about the size of your fist. They are located below your ribs, one on each side of your spine.  CAUSES  Infections are caused by microbes, which are microscopic organisms, including fungi, viruses, and bacteria. These organisms are so small that they can only be seen through a microscope. Bacteria are the microbes that most commonly cause UTIs.  SYMPTOMS   Symptoms of UTIs may vary by age and gender of the patient and by the location of the infection. Symptoms in young women typically include a frequent and intense urge to urinate and a painful, burning feeling in the bladder or urethra during urination. Older women and men are more likely to be tired, shaky, and weak and have muscle aches and abdominal pain. A fever may mean the infection is in your kidneys. Other symptoms of a kidney infection include pain in your back or sides below the ribs, nausea, and vomiting.  DIAGNOSIS  To diagnose a UTI, your caregiver will ask you about your symptoms. Your caregiver also will ask to provide a urine sample. The urine sample will be tested for bacteria and white blood cells. White blood cells are made by your body to help fight infection.  TREATMENT   Typically, UTIs can be treated with medication. Because most UTIs are caused by a bacterial infection, they usually can be treated with the use of antibiotics. The choice of antibiotic and length of treatment depend on your symptoms and the type of bacteria causing your infection.  HOME CARE INSTRUCTIONS   If you were prescribed antibiotics, take them exactly as your caregiver instructs you. Finish the medication even if you feel better after you  have only taken some of the medication.   Drink enough water and fluids to keep your urine clear or pale yellow.   Avoid caffeine, tea, and carbonated beverages. They tend to irritate your bladder.   Empty your bladder often. Avoid holding urine for long periods of time.   Empty your bladder before and after sexual intercourse.   After a bowel movement, women should cleanse from front to back. Use each tissue only once.  SEEK MEDICAL CARE IF:    You have back pain.   You develop a fever.   Your symptoms do not begin to resolve within 3 days.  SEEK IMMEDIATE MEDICAL CARE IF:    You have severe back pain or lower abdominal pain.   You develop chills.   You have nausea or vomiting.   You have continued burning or discomfort with urination.  MAKE SURE YOU:    Understand these instructions.   Will watch your condition.   Will get help right away if you are not doing well or get worse.  Document Released: 01/08/2005 Document Revised: 09/30/2011 Document Reviewed: 05/09/2011  [employer] Patient Information 2014 [employer]

## 2013-08-23 NOTE — ED Notes (Addendum)
Pt from group home due to MR. Group home manager with pt and she states his abd started swelling one month ago but considerably worse over the weekend. Unknown last bm b/c he doesn't let anyone know when he goes to bathroom. Pt alert/oriented to little due to dementia and MR. Nad. No s/s of pain at this time. abd distended and more hard to upper mid abd than lower.

## 2013-08-29 LAB — URINE CULTURE: Colony Count: >=100000

## 2013-09-01 ENCOUNTER — Telehealth (HOSPITAL_BASED_OUTPATIENT_CLINIC_OR_DEPARTMENT_OTHER): Payer: Self-pay | Admitting: Emergency Medicine

## 2013-09-01 NOTE — Telephone Encounter (Signed)
Post ED Visit - Positive Culture Follow-up  Culture report reviewed by antimicrobial stewardship pharmacist: []  Wes [place], Pharm.D., BCPS []  Heide Guile, Pharm.D., BCPS []  Alycia Rossetti, Pharm.D., BCPS []  [place], Pharm.D., BCPS, AAHIVP []  Legrand Como, Pharm.D., BCPS, AAHIVP []  Juliene Pina, Pharm.D. [x]  Assunta Curtis, Pharm.D.  Positive urine culture Treated with Cipro, organism sensitive to the same and no further patient follow-up is required at this time.  Xavier Tanner 09/01/2013, 10:40 AM

## 2013-09-02 ENCOUNTER — Other Ambulatory Visit (HOSPITAL_COMMUNITY): Payer: Self-pay | Admitting: Oncology

## 2013-09-02 DIAGNOSIS — C182 Malignant neoplasm of ascending colon: Secondary | ICD-10-CM

## 2013-09-02 MED ORDER — CAPECITABINE 500 MG PO TABS: 1000.0000 mg | ORAL_TABLET | Freq: Two times a day (BID) | ORAL | Status: AC

## 2013-09-08 ENCOUNTER — Ambulatory Visit: Payer: Medicare Other | Admitting: Family Medicine

## 2013-09-08 IMAGING — CT CT ABD-PELV W/ CM
2 of 4 series · 16 of 46 positions shown, 18 images · IV contrast (omnipaque)
Comparison: CT scan 07/19/2008

CLINICAL DATA: Right upper quadrant mass, urinary incontinence

CT ABDOMEN AND PELVIS WITH CONTRAST
TECHNIQUE: Multidetector CT imaging of the abdomen and pelvis was
performed following the standard protocol during bolus
administration of intravenous contrast.
Contrast: 100mL OMNIPAQUE IOHEXOL 300 MG/ML  SOLN

[Series 2: abd_pel_with 5.0 b40f · axial · 0.77mm/px · z∈[-383,-18]mm · 13 of 85 slices shown, 15 images]
[im 6/85  soft-tissue]
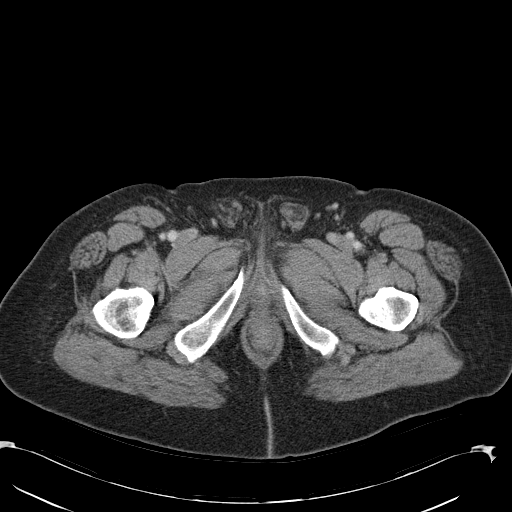
[im 6/85  bone]
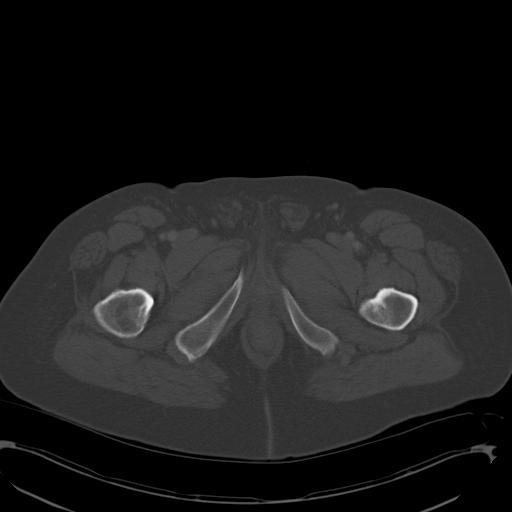
[im 11/85  soft-tissue]
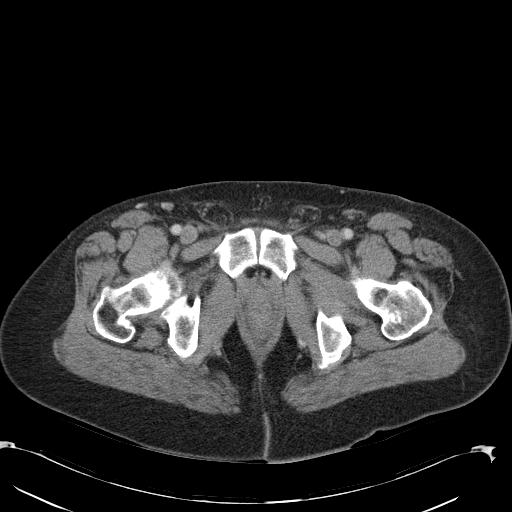
[im 16/85  soft-tissue]
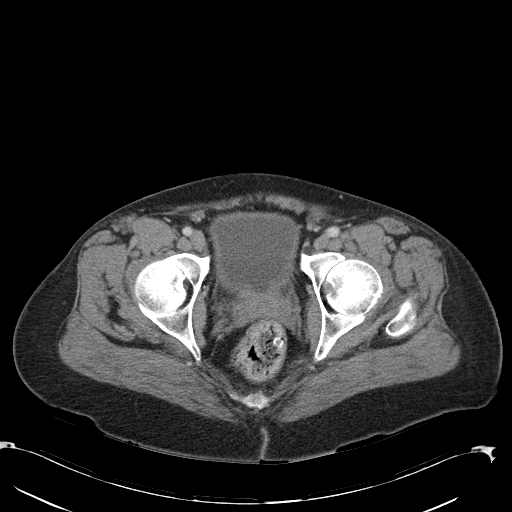
[im 27/85  soft-tissue]
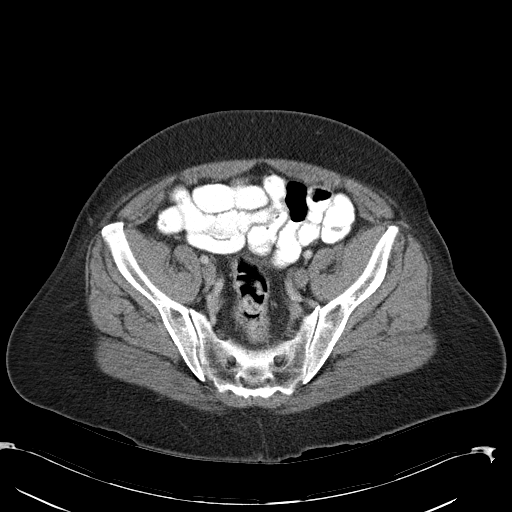
[im 32/85  soft-tissue]
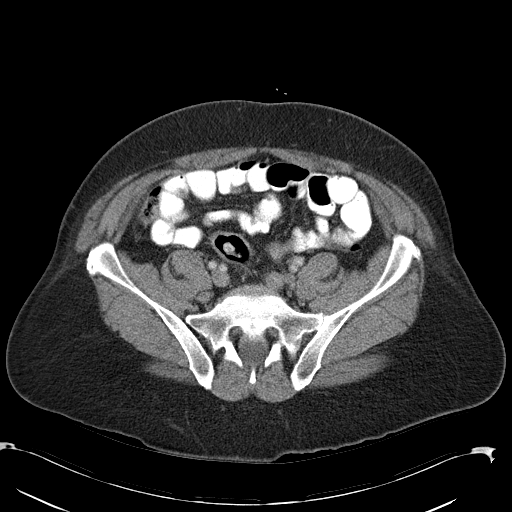
[im 37/85  soft-tissue]
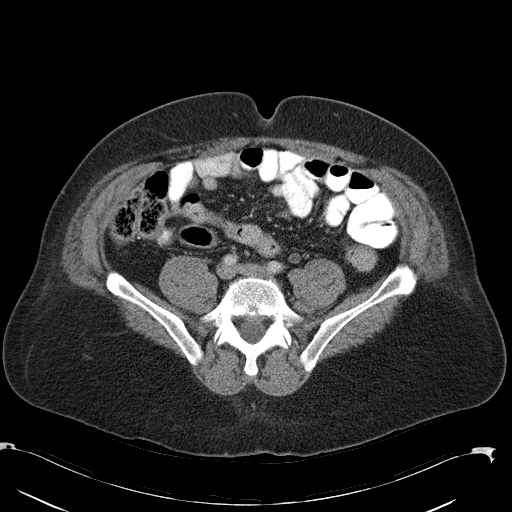
[im 43/85  soft-tissue]
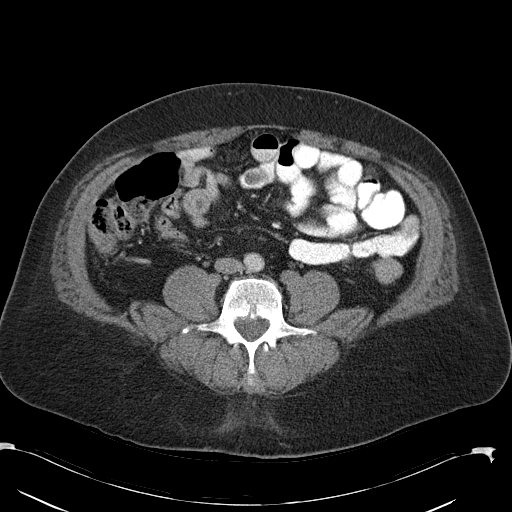
[im 48/85  soft-tissue]
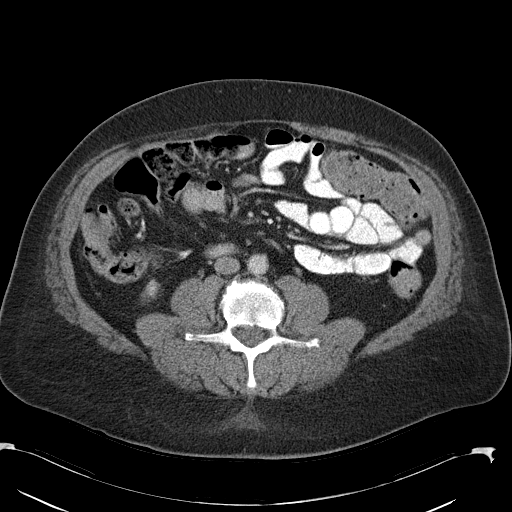
[im 53/85  soft-tissue]
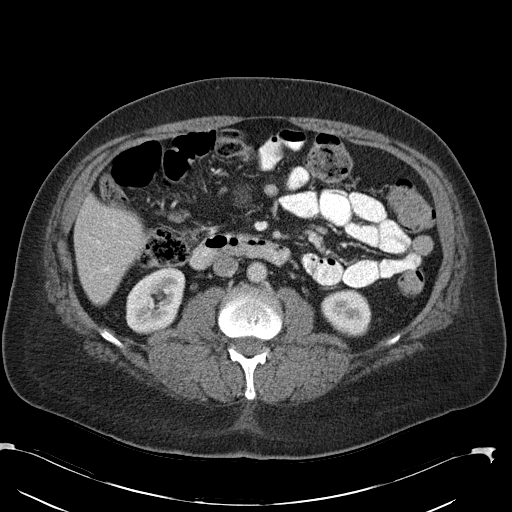
[im 53/85  bone]
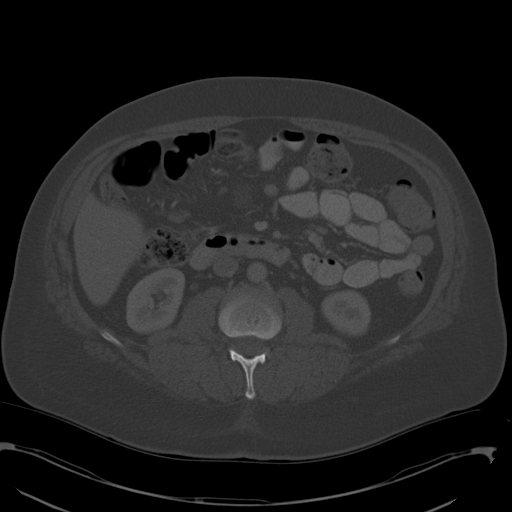
[im 58/85  soft-tissue]
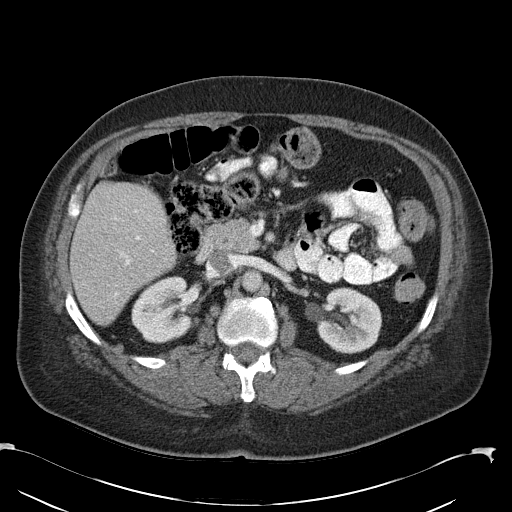
[im 69/85  soft-tissue]
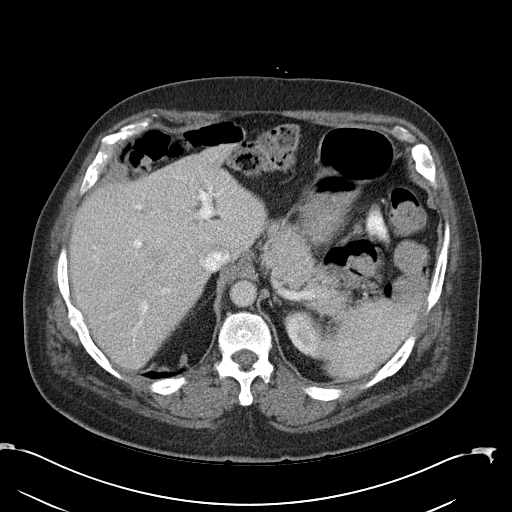
[im 74/85  soft-tissue]
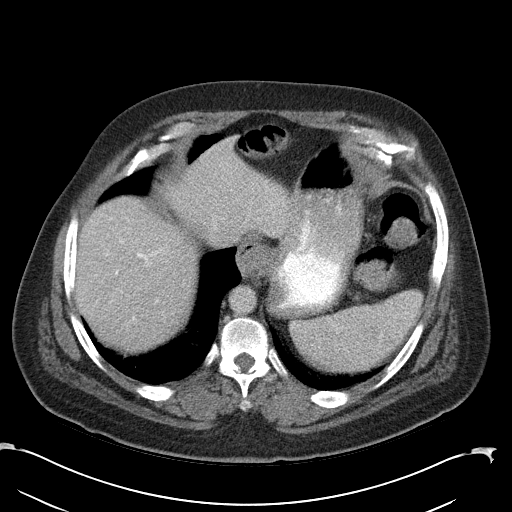
[im 79/85  soft-tissue]
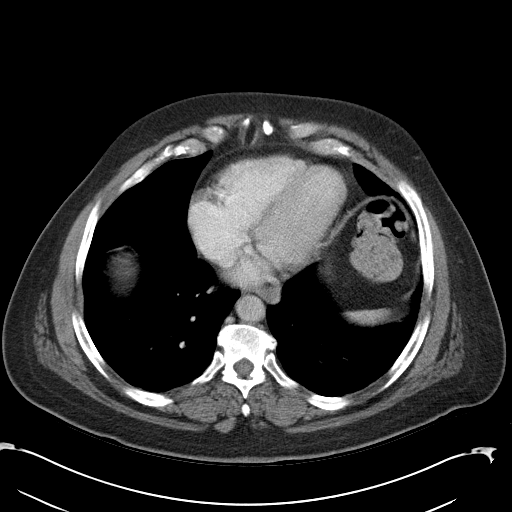

[Series 4: abd_pel_with 3.0 spo cor · coronal · 0.68mm/px · 3 of 87 slices shown]
[im 29/87  soft-tissue]
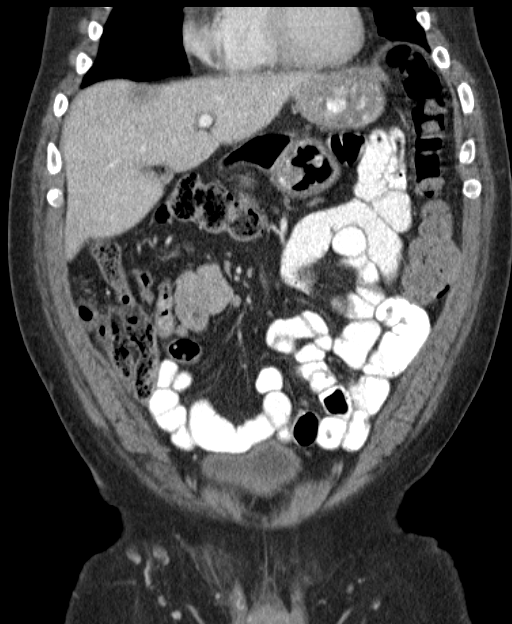
[im 39/87  soft-tissue]
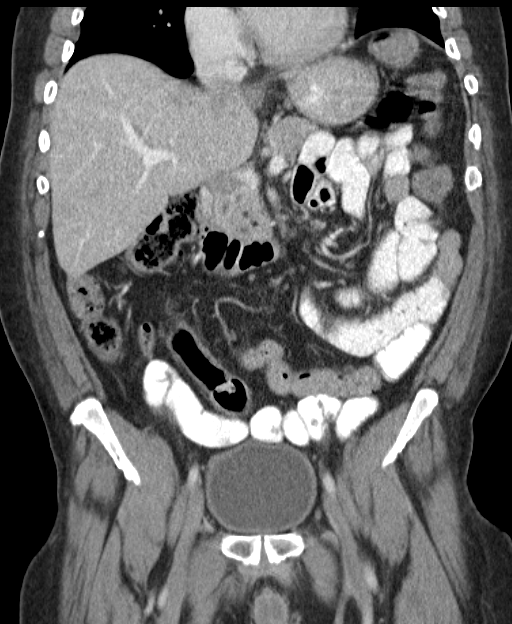
[im 48/87  soft-tissue]
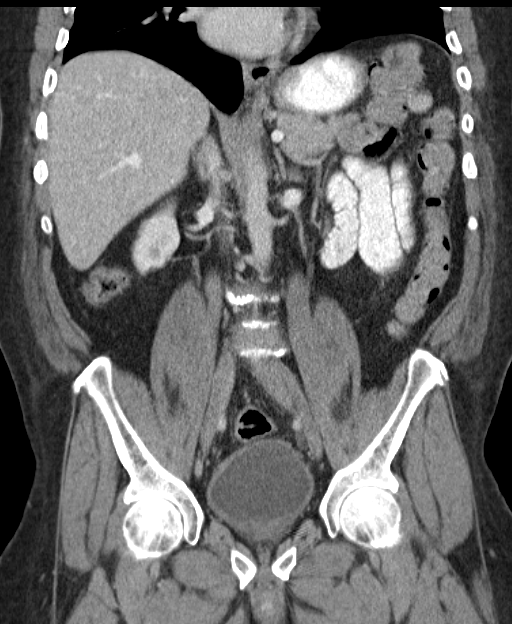

[16 of 46 positions shown; findings below may reference images not displayed]

FINDINGS: Lung bases are unremarkable.
Sagittal images of the spine shows mild degenerative changes lumbar
spine.  A small hiatal hernia is noted.   Old right lateral rib
fractures.

Enhanced liver is unremarkable.  Gallbladder is contracted without
calcified gallstones.  Pancreas spleen and adrenal glands are
unremarkable.  Kidneys are symmetrical in size and enhancement.  No
focal renal mass.  No hydronephrosis or hydroureter.

Delayed renal images shows bilateral renal symmetrical excretion.
Bilateral visualized ureter is unremarkable.

There is no small bowel obstruction.  No ascites or free air.  No
adenopathy.  Small accessory splenule.

There is probable prior TURP.  No urinary bladder filling defects
are noted on delayed images of the bladder.  Mild indentation of
the base of urinary bladder by prostate gland.  Bilateral small
inguinal canal hernia containing fat without evidence of acute
complication.

Moderate stool noted throughout the colon.  No enhancing abdominal
mass is noted.  No mesenteric mass is noted.  There is no aortic
aneurysm.  No pericecal inflammation.  Seminal vesicles are
unremarkable.  No destructive bony lesions are noted within pelvis.
IMPRESSION: 1.  Small hiatal hernia is noted.
2.  No enhancing abdominal mass is noted.  No mesenteric mass is
identified.
3.  Stool noted throughout the colon.
4.  No hydronephrosis or hydroureter.
5.  Probable TURP defect anterior aspect of the prostate gland.
Mild indentation of urinary bladder base by the prostate gland.

## 2013-09-20 ENCOUNTER — Ambulatory Visit (HOSPITAL_COMMUNITY): Payer: Medicare Other

## 2013-09-21 ENCOUNTER — Encounter (HOSPITAL_COMMUNITY): Payer: Self-pay

## 2013-09-21 ENCOUNTER — Encounter (HOSPITAL_COMMUNITY): Payer: Medicare Other | Attending: Hematology and Oncology

## 2013-09-21 VITALS — BP 144/81 | HR 81 | Temp 97.4°F | Resp 16 | Wt 152.4 lb

## 2013-09-21 DIAGNOSIS — Z8546 Personal history of malignant neoplasm of prostate: Secondary | ICD-10-CM

## 2013-09-21 DIAGNOSIS — F79 Unspecified intellectual disabilities: Secondary | ICD-10-CM

## 2013-09-21 DIAGNOSIS — D500 Iron deficiency anemia secondary to blood loss (chronic): Secondary | ICD-10-CM | POA: Insufficient documentation

## 2013-09-21 DIAGNOSIS — C182 Malignant neoplasm of ascending colon: Secondary | ICD-10-CM | POA: Insufficient documentation

## 2013-09-21 DIAGNOSIS — K861 Other chronic pancreatitis: Secondary | ICD-10-CM

## 2013-09-21 DIAGNOSIS — R160 Hepatomegaly, not elsewhere classified: Secondary | ICD-10-CM

## 2013-09-21 DIAGNOSIS — R350 Frequency of micturition: Secondary | ICD-10-CM

## 2013-09-21 DIAGNOSIS — R945 Abnormal results of liver function studies: Secondary | ICD-10-CM

## 2013-09-21 DIAGNOSIS — K769 Liver disease, unspecified: Secondary | ICD-10-CM | POA: Insufficient documentation

## 2013-09-21 DIAGNOSIS — R198 Other specified symptoms and signs involving the digestive system and abdomen: Secondary | ICD-10-CM

## 2013-09-21 LAB — COMPREHENSIVE METABOLIC PANEL
ALT: 14 U/L (ref 0–53)
AST: 22 U/L (ref 0–37)
Albumin: 3.9 g/dL (ref 3.5–5.2)
Alkaline Phosphatase: 71 U/L (ref 39–117)
BILIRUBIN TOTAL: 0.9 mg/dL (ref 0.3–1.2)
BUN: 19 mg/dL (ref 6–23)
CHLORIDE: 98 meq/L (ref 96–112)
CO2: 27 mEq/L (ref 19–32)
CREATININE: 0.82 mg/dL (ref 0.50–1.35)
Calcium: 9.2 mg/dL (ref 8.4–10.5)
GFR calc Af Amer: >90 mL/min (ref >90)
GFR calc non Af Amer: >90 mL/min (ref >90)
Glucose, Bld: 95 mg/dL (ref 70–99)
Potassium: 4.3 mEq/L (ref 3.7–5.3)
SODIUM: 138 meq/L (ref 137–147)
Total Protein: 7.7 g/dL (ref 6.0–8.3)

## 2013-09-21 LAB — CBC WITH DIFFERENTIAL/PLATELET
BASOS ABS: 0 10*3/uL (ref 0.0–0.1)
Basophils Relative: 0 % (ref 0–1)
Eosinophils Absolute: 0 10*3/uL (ref 0.0–0.7)
Eosinophils Relative: 0 % (ref 0–5)
HCT: 31.9 % — ABNORMAL LOW (ref 39.0–52.0)
Hemoglobin: 10.6 g/dL — ABNORMAL LOW (ref 13.0–17.0)
LYMPHS PCT: 27 % (ref 12–46)
Lymphs Abs: 1.9 10*3/uL (ref 0.7–4.0)
MCH: 25.1 pg — ABNORMAL LOW (ref 26.0–34.0)
MCHC: 33.2 g/dL (ref 30.0–36.0)
MCV: 75.4 fL — ABNORMAL LOW (ref 78.0–100.0)
Monocytes Absolute: 0.5 10*3/uL (ref 0.1–1.0)
Monocytes Relative: 7 % (ref 3–12)
NEUTROS ABS: 4.5 10*3/uL (ref 1.7–7.7)
Neutrophils Relative %: 65 % (ref 43–77)
PLATELETS: 201 10*3/uL (ref 150–400)
RBC: 4.23 MIL/uL (ref 4.22–5.81)
RDW: 25.4 % — AB (ref 11.5–15.5)
WBC: 7 10*3/uL (ref 4.0–10.5)

## 2013-09-21 LAB — FERRITIN: Ferritin: 8 ng/mL — ABNORMAL LOW (ref 22–322)

## 2013-09-21 LAB — LIPASE, BLOOD: LIPASE: 54 U/L (ref 11–59)

## 2013-09-21 NOTE — Progress Notes (Signed)
Labs drawn for lipase, cbc/diff, cmp, ferr, cea.

## 2013-09-21 NOTE — Addendum Note (Signed)
Addended by: Elenor Legato on: 09/21/2013 01:08 PM   Modules accepted: Orders

## 2013-09-21 NOTE — Progress Notes (Signed)
[facility]  OFFICE PROGRESS NOTE  Xavier Nakayama, MD [address] [place] [address]  DIAGNOSIS: Colon cancer, ascending, stage II-A  Liver mass - Plan: MR Abdomen W Wo Contrast, CANCELED: MR Abdomen W Contrast  Pancreatitis, chronic - Plan: Lipase  Mental retardation  Colon cancer, ascending - Plan: CBC with Differential, Comprehensive metabolic panel, CEA  Anemia due to chronic blood loss - Plan: Ferritin  Chief Complaint  Patient presents with  . Cancer descending colon stage II-A    CURRENT THERAPY: Xeloda 1000 mg twice a day for 7 days on and 7 days off.  INTERVAL HISTORY: Xavier Tanner 63 y.o. male returns for followup while taking Xeloda 1000 mg twice a day for 7 days on and 7 days off for stage II colon cancer, microsatellite stable, I high Oncotype DX colon recurrence score of 44 reflecting a 19% chance of recurrence and for that reason Xeloda was started after consultation with his power of attorney. Early in March 2015 he develop pancreatitis in association with elevation in CEA. Intermediate CT scan demonstrated liver abnormality that was of a certain clinical significance with similar interpretation on ultrasound of the liver done on 07/26/2011. Last CEA done 07/21/2013 with 16.8. He is here with his caretaker. Appetite has been good with no nausea or vomiting. He denies any diarrhea and has had much less discomfort involving his hands. He denies any fever, night sweats, sore throat, abdominal pain, back pain, fever, or night sweats.  MEDICAL HISTORY: Past Medical History  Diagnosis Date  . Urinary incontinence   . Psychotic disorder   . Diabetes mellitus, type 2   . Hypertension   . Bilateral cataracts 2011  . Vitamin D deficiency   . Mental retardation   . Depression   . Microcytic anemia 12/03/2012  . HOH (hard of hearing)   . Prostate cancer   . Colon cancer, ascending 12/04/2012    New  diagnosis.  . Dementia     INTERIM HISTORY: has ONYCHOMYCOSIS, TOENAILS; Unspecified vitamin D deficiency; HYPERLIPIDEMIA; Unspecified psychosis; HYPERTENSION; FATIGUE; URINARY INCONTINENCE; TINEA CRURIS; Tremor; Mental retardation; Hypokalemia; Microcytic anemia; Colon cancer, ascending; Type 2 diabetes, diet controlled; Depression; Anemia due to chronic blood loss; Dermatomycosis; Routine general medical examination at a health care facility; and H/O prostate cancer on his problem list.    ALLERGIES:  is allergic to ace inhibitors.  MEDICATIONS: has a current medication list which includes the following prescription(s): bisacodyl, capecitabine, ciprofloxacin, citalopram, clonazepam, fluticasone, gabapentin, hydrochlorothiazide, hydrocortisone cream, magnesium hydroxide, memantine hcl er, omeprazole, oxybutynin, pravastatin, quetiapine, tamsulosin, and terbinafine.  SURGICAL HISTORY:  Past Surgical History  Procedure Laterality Date  . Hemorroidectomy  2002  . Transurethral resection of prostate  July 09, 2007  . Colonoscopy N/A 12/04/2012    Procedure: COLONOSCOPY;  Surgeon: Daneil Dolin, MD;  Location: [facility];  Service: Endoscopy;  Laterality: N/A;  . Esophagogastroduodenoscopy N/A 12/04/2012    Procedure: ESOPHAGOGASTRODUODENOSCOPY (EGD);  Surgeon: Daneil Dolin, MD;  Location: [facility];  Service: Endoscopy;  Laterality: N/A;  . Colon resection N/A 12/15/2012    Procedure: HAND ASSISTED LAPAROSCOPIC PARTIAL COLECTOMY;  Surgeon: Donato Heinz, MD;  Location: [facility];  Service: General;  Laterality: N/A;  . Colon resection N/A 12/15/2012    Procedure: COLON RESECTION;  Surgeon: Donato Heinz, MD;  Location: [facility];  Service: General;  Laterality: N/A;    FAMILY HISTORY: family  history includes Diabetes in his mother; Hypertension in his mother; Mental illness in his mother; Stroke in his father.  SOCIAL HISTORY:  reports that he has never smoked. He has never used smokeless  tobacco. He reports that he does not drink alcohol or use illicit drugs.  REVIEW OF SYSTEMS:  Other than that discussed above is noncontributory.  PHYSICAL EXAMINATION: ECOG PERFORMANCE STATUS: 1 - Symptomatic but completely ambulatory  Blood pressure 144/81, pulse 81, temperature 97.4 F (36.3 C), temperature source Oral, resp. rate 16, weight 152 lb 6.4 oz (69.128 kg).  GENERAL:alert, no distress and comfortable SKIN: skin color, texture, turgor are normal, no rashes or significant lesions EYES: PERLA; Conjunctiva are pink and non-injected, sclera clear SINUSES: No redness or tenderness over maxillary or ethmoid sinuses OROPHARYNX:no exudate, no erythema on lips, buccal mucosa, or tongue. NECK: supple, thyroid normal size, non-tender, without nodularity. No masses CHEST: Normal AP diameter with no breast masses. LYMPH:  no palpable lymphadenopathy in the cervical, axillary or inguinal LUNGS: clear to auscultation and percussion with normal breathing effort HEART: regular rate & rhythm and no murmurs. ABDOMEN:abdomen soft, non-tender and normal bowel sounds. Distended with adiposity and no evidence of free fluid wave or shifting dullness. No rebound tenderness. MUSCULOSKELETAL:no cyanosis of digits and no clubbing. Range of motion normal.  NEURO: Slurred speech but answers simple questions appropriately.   LABORATORY DATA:  Results for Xavier, Tanner (MRN [id number]) as of 09/21/2013 11:46  Ref. Range 02/25/2013 08:52 05/23/2013 11:24 06/20/2013 11:27 07/21/2013 09:36  CEA Latest Range: 0.0-5.0 ng/mL 13.9 (H) 10.2 (H) 12.2 (H) 16.8 (H)     Office Visit on 09/21/2013  Component Date Value Ref Range Status  . WBC 09/21/2013 7.0  4.0 - 10.5 K/uL Final  . RBC 09/21/2013 4.23  4.22 - 5.81 MIL/uL Final  . Hemoglobin 09/21/2013 10.6* 13.0 - 17.0 g/dL Final  . HCT 09/21/2013 31.9* 39.0 - 52.0 % Final  . MCV 09/21/2013 75.4* 78.0 - 100.0 fL Final  . MCH 09/21/2013 25.1* 26.0 - 34.0 pg  Final  . MCHC 09/21/2013 33.2  30.0 - 36.0 g/dL Final  . RDW 09/21/2013 25.4* 11.5 - 15.5 % Final  . Platelets 09/21/2013 201  150 - 400 K/uL Final  . Neutrophils Relative % 09/21/2013 65  43 - 77 % Final  . Neutro Abs 09/21/2013 4.5  1.7 - 7.7 K/uL Final  . Lymphocytes Relative 09/21/2013 27  12 - 46 % Final  . Lymphs Abs 09/21/2013 1.9  0.7 - 4.0 K/uL Final  . Monocytes Relative 09/21/2013 7  3 - 12 % Final  . Monocytes Absolute 09/21/2013 0.5  0.1 - 1.0 K/uL Final  . Eosinophils Relative 09/21/2013 0  0 - 5 % Final  . Eosinophils Absolute 09/21/2013 0.0  0.0 - 0.7 K/uL Final  . Basophils Relative 09/21/2013 0  0 - 1 % Final  . Basophils Absolute 09/21/2013 0.0  0.0 - 0.1 K/uL Final  Admission on 08/23/2013, Discharged on 08/23/2013  Component Date Value Ref Range Status  . WBC 08/23/2013 4.8  4.0 - 10.5 K/uL Final  . RBC 08/23/2013 4.02* 4.22 - 5.81 MIL/uL Final  . Hemoglobin 08/23/2013 9.9* 13.0 - 17.0 g/dL Final  . HCT 08/23/2013 30.6* 39.0 - 52.0 % Final  . MCV 08/23/2013 76.1* 78.0 - 100.0 fL Final  . MCH 08/23/2013 24.6* 26.0 - 34.0 pg Final  . MCHC 08/23/2013 32.4  30.0 - 36.0 g/dL Final  . RDW 08/23/2013 24.1* 11.5 - 15.5 %  Final  . Platelets 08/23/2013 183  150 - 400 K/uL Final  . Neutrophils Relative % 08/23/2013 63  43 - 77 % Final  . Neutro Abs 08/23/2013 3.0  1.7 - 7.7 K/uL Final  . Lymphocytes Relative 08/23/2013 28  12 - 46 % Final  . Lymphs Abs 08/23/2013 1.3  0.7 - 4.0 K/uL Final  . Monocytes Relative 08/23/2013 8  3 - 12 % Final  . Monocytes Absolute 08/23/2013 0.4  0.1 - 1.0 K/uL Final  . Eosinophils Relative 08/23/2013 1  0 - 5 % Final  . Eosinophils Absolute 08/23/2013 0.0  0.0 - 0.7 K/uL Final  . Basophils Relative 08/23/2013 0  0 - 1 % Final  . Basophils Absolute 08/23/2013 0.0  0.0 - 0.1 K/uL Final  . Sodium 08/23/2013 140  137 - 147 mEq/L Final  . Potassium 08/23/2013 3.9  3.7 - 5.3 mEq/L Final  . Chloride 08/23/2013 103  96 - 112 mEq/L Final  . CO2  08/23/2013 29  19 - 32 mEq/L Final  . Glucose, Bld 08/23/2013 96  70 - 99 mg/dL Final  . BUN 08/23/2013 20  6 - 23 mg/dL Final  . Creatinine, Ser 08/23/2013 0.88  0.50 - 1.35 mg/dL Final  . Calcium 08/23/2013 9.3  8.4 - 10.5 mg/dL Final  . Total Protein 08/23/2013 6.8  6.0 - 8.3 g/dL Final  . Albumin 08/23/2013 3.3* 3.5 - 5.2 g/dL Final  . AST 08/23/2013 21  0 - 37 U/L Final  . ALT 08/23/2013 17  0 - 53 U/L Final  . Alkaline Phosphatase 08/23/2013 60  39 - 117 U/L Final  . Total Bilirubin 08/23/2013 0.6  0.3 - 1.2 mg/dL Final  . GFR calc non Af Amer 08/23/2013 89* >90 mL/min Final  . GFR calc Af Amer 08/23/2013 >90  >90 mL/min Final   Comment: (NOTE)                          The eGFR has been calculated using the CKD EPI equation.                          This calculation has not been validated in all clinical situations.                          eGFR's persistently <90 mL/min signify possible Chronic Kidney                          Disease.  . Lipase 08/23/2013 36  11 - 59 U/L Final  . Lactic Acid, Venous 08/23/2013 0.71  0.5 - 2.2 mmol/L Final  . Color, Urine 08/23/2013 YELLOW  YELLOW Final  . APPearance 08/23/2013 CLEAR  CLEAR Final  . Specific Gravity, Urine 08/23/2013 <1.005* 1.005 - 1.030 Final  . pH 08/23/2013 5.5  5.0 - 8.0 Final  . Glucose, UA 08/23/2013 NEGATIVE  NEGATIVE mg/dL Final  . Hgb urine dipstick 08/23/2013 NEGATIVE  NEGATIVE Final  . Bilirubin Urine 08/23/2013 NEGATIVE  NEGATIVE Final  . Ketones, ur 08/23/2013 NEGATIVE  NEGATIVE mg/dL Final  . Protein, ur 08/23/2013 NEGATIVE  NEGATIVE mg/dL Final  . Urobilinogen, UA 08/23/2013 0.2  0.0 - 1.0 mg/dL Final  . Nitrite 08/23/2013 POSITIVE* NEGATIVE Final  . Leukocytes, UA 08/23/2013 MODERATE* NEGATIVE Final  . Ammonia 08/23/2013 18  11 - 60 umol/L Final  .  Squamous Epithelial / LPF 08/23/2013 FEW* RARE Final  . WBC, UA 08/23/2013 7-10  <3 WBC/hpf Final   WITH WBC CLUMPS  . RBC / HPF 08/23/2013 3-6  <3 RBC/hpf Final    . Bacteria, UA 08/23/2013 FEW* RARE Final  . Specimen Description 08/23/2013 URINE, CATHETERIZED   Final  . Special Requests 08/23/2013 NONE   Final  . Culture  Setup Time 08/23/2013    Final                   Value:08/24/2013 01:57                         Performed at [facility]  . Colony Count 08/23/2013    Final                   Value:>=100,000 COLONIES/ML                         Performed at [facility]  . Culture 08/23/2013    Final                   Value:METHICILLIN RESISTANT STAPHYLOCOCCUS AUREUS                         Note: RIFAMPIN AND GENTAMICIN SHOULD NOT BE USED AS SINGLE DRUGS FOR TREATMENT OF STAPH INFECTIONS.                         Performed at [facility]  . Report Status 08/23/2013 08/29/2013 FINAL   Final  . Organism ID, Bacteria 08/23/2013 METHICILLIN RESISTANT STAPHYLOCOCCUS AUREUS   Final    PATHOLOGY: No new pathology. Microsatellite stable stage II-A adenocarcinoma the ascending colon with Oncotype recurrence score 44  Urinalysis    Component Value Date/Time   COLORURINE YELLOW 08/23/2013 1210   APPEARANCEUR CLEAR 08/23/2013 1210   LABSPEC <1.005* 08/23/2013 1210   PHURINE 5.5 08/23/2013 1210   GLUCOSEU NEGATIVE 08/23/2013 1210   HGBUR NEGATIVE 08/23/2013 1210   BILIRUBINUR NEGATIVE 08/23/2013 1210   BILIRUBINUR neg 07/21/2012 1544   KETONESUR NEGATIVE 08/23/2013 1210   PROTEINUR NEGATIVE 08/23/2013 1210   PROTEINUR neg 07/21/2012 1544   UROBILINOGEN 0.2 08/23/2013 1210   UROBILINOGEN 0.2 07/21/2012 1544   NITRITE POSITIVE* 08/23/2013 1210   NITRITE neg 07/21/2012 1544   LEUKOCYTESUR MODERATE* 08/23/2013 1210    RADIOGRAPHIC STUDIES:  CT Abdomen Pelvis Wo Contrast Status: Final result         PACS Images    Show images for CT Abdomen Pelvis Wo Contrast         Study Result    CLINICAL DATA: Abdominal pain. Evaluate for kidney stone. History  of diabetes. Mentally handicapped. Prostate cancer. Colon cancer.  EXAM:  CT  ABDOMEN AND PELVIS WITHOUT CONTRAST  TECHNIQUE:  Multidetector CT imaging of the abdomen and pelvis was performed  following the standard protocol without intravenous contrast.  COMPARISON: CT ABD - PELV W/ CM dated 11/24/2011  FINDINGS:  Lower Chest: Moderate motion degradation. Lung bases grossly clear.  Normal heart size without pericardial or pleural effusion.  Abdomen/Pelvis: Moderate motion degradation continuing into the  abdomen and pelvis. Apparent hypoattenuation in the inferior right  lobe of the liver on image 37 could be artifact.  Grossly normal noncontrast appearance of the spleen. Small hiatal  hernia. Normal pancreas, gallbladder, biliary  tract, adrenal glands.  No gross urinary tract calculus or hydronephrosis. No hydroureter or  ureteric calculi. No retroperitoneal or retrocrural adenopathy.  Colonic stool burden suggests constipation. Surgical changes, likely  of partial right-sided hemicolectomy. Small bowel normal in caliber.  No gross ascites or free intraperitoneal air. Ventral abdominal wall  hernias or areas of laxity containing nonobstructive bowel on images  39 and 46.  No pelvic adenopathy. Normal urinary bladder. TURP defect. Tiny fat  containing right inguinal hernia. No significant free fluid.  Bones/Musculoskeletal: No gross osseous abnormality.  IMPRESSION:  1. Moderately motion degraded exam.  2. Given this factor, no urinary tract calculi or hydronephrosis.  3. Possible constipation.  4. Possible inferior right liver lobe low-density lesion versus  artifact. Given the history of colon cancer, nonemergent ultrasound  could be performed to exclude liver lesion.  5. Nonobstructive small bowel within ventral abdominal wall hernias  or areas of wall laxity.  Electronically Signed  By: Abigail Miyamoto M.D.  On: 06/11/2013 16:51        US Abdomen Limited Status: Final result         PACS Images    Show images for US Abdomen Limited          Study Result    CLINICAL DATA: History of colon cancer. Possible liver mass on  prior exam.  EXAM:  US ABDOMEN LIMITED - RIGHT UPPER QUADRANT  COMPARISON: CT ABD/PELV WO CM dated 06/11/2013; CT ABD - PELV W/ CM  dated 11/24/2011  FINDINGS:  Gallbladder:  No gallstones or wall thickening visualized. No sonographic Murphy  sign noted.  Common bile duct:  Diameter: 4 mm  Liver:  At the inferior aspect of the posterior segment right hepatic lobe,  there is a suggestion of inhomogeneous echogenicity 2.9 cm  maximally, which could correspond to the finding on the prior exam.  Imaging features are nonspecific.  IMPRESSION:  Nonspecific area of inhomogeneous echogenicity at the posterior  segment right hepatic lobe which could correspond to the questioned  mass on prior imaging. Abdominal MRI with contrast is recommended  for further definitive characterization. Because the patient  previously had difficulty remaining still for the dissimilar prior  exam, it may be helpful to evaluate whether he could benefit from  premedication or pre exam coaching to maximize yield of MRI, if  performed.  Electronically Signed  By: Conchita Paris M.D.  On: 07/25/2013      Dg Abd 1 View  08/23/2013   CLINICAL DATA:  Abdominal distention and pain  EXAM: ABDOMEN - 1 VIEW  COMPARISON:  Prior CT abdomen/ pelvis 06/11/2013  FINDINGS: No large free air on this supine radiograph. Gas and stool are noted throughout the colon. The colonic stool burden is high suggesting underlying constipation. Gas is noted within nondilated loops of small bowel in the mid abdomen. No acute osseous abnormality.  IMPRESSION: 1. Large colonic stool burden extending to the rectum suggests an element of constipation. 2. Nonspecific bowel gas pattern with gas throughout both large and small bowel without definitive dilatation. Findings may be related to underlying constipation, or ileus.   Electronically Signed   By: Jacqulynn Cadet M.D.   On: 08/23/2013 10:54    ASSESSMENT:  #1.Stage IIA adenocarcinoma of the descending colon status post segmental resection with clear margins and 15 lymph nodes all negative in the surgical specimen, microsatellite stable, Oncotype DX colon recurrence score 44 with an anticipated relapse rate of 19% over the next 5 years, upper extremity  cutaneous toxicity improved on alternate regimen, to review today's CEA determination, lasting 10.2 on 05/23/2013, status post 6 months of chemotherapy.  #2. Abdominal discomfort with urinary frequency but no evidence of kidney stone, ultrasound suggestive of a possible metastasis in the liver.  #3. Mental retardation with dementia, functioning well in a group home.  #4. History of prostate cancer, status post TURP, now with urinary symptomatology with multiple bacterial morphotypes seen on culture, greater than 100,000 colonies per milliliter. No organisms were predominant.  #5. Abnormal CT scan of the liver.  #6. Recent episode of pancreatitis treated conservatively. May be responsible for elevated CEA.    PLAN:  #1. Discontinue Xeloda. #2. MRI of the liver with contrast in an open MRI for end of July 2015. #3. Followup in 4 months with CBC, chem profile, CEA, and lipase.   All questions were answered. The patient knows to call the clinic with any problems, questions or concerns. We can certainly see the patient much sooner if necessary.   I spent 25 minutes counseling the patient  and his caretaker face to face. The total time spent in the appointment was 30 minutes.    Doroteo Bradford, MD 09/21/2013 12:56 PM  DISCLAIMER:  This note was dictated with voice recognition software.  Similar sounding words can inadvertently be transcribed inaccurately and may not be corrected upon review.

## 2013-09-21 NOTE — Progress Notes (Signed)
This encounter was created in error - please disregard.

## 2013-09-21 NOTE — Patient Instructions (Signed)
[facility] Discharge Instructions  RECOMMENDATIONS MADE BY THE CONSULTANT AND ANY TEST RESULTS WILL BE SENT TO YOUR REFERRING PHYSICIAN.  EXAM FINDINGS BY THE PHYSICIAN TODAY AND SIGNS OR SYMPTOMS TO REPORT TO CLINIC OR PRIMARY PHYSICIAN: You seen Dr Barnet Glasgow Today   Follow up with doctor in 4 months with lab work.  You will have an open MRI of your liver in July.     Thank you for choosing [facility] to provide your oncology and hematology care.  To afford each patient quality time with our providers, please arrive at least 15 minutes before your scheduled appointment time.  With your help, our goal is to use those 15 minutes to complete the necessary work-up to ensure our physicians have the information they need to help with your evaluation and healthcare recommendations.    Effective January 1st, 2014, we ask that you re-schedule your appointment with our physicians should you arrive 10 or more minutes late for your appointment.  We strive to give you quality time with our providers, and arriving late affects you and other patients whose appointments are after yours.    Again, thank you for choosing [facility]  Our hope is that these requests will decrease the amount of time that you wait before being seen by our physicians.       _____________________________________________________________  Should you have questions after your visit to [facility], please contact our office at [phone] between the hours of 8:30 a.m. and 5:00 p.m.  Voicemails left after 4:30 p.m. will not be returned until the following business day.  For prescription refill requests, have your pharmacy contact our office with your prescription refill request.

## 2013-09-22 ENCOUNTER — Emergency Department (HOSPITAL_COMMUNITY): Payer: Medicare Other

## 2013-09-22 ENCOUNTER — Emergency Department (HOSPITAL_COMMUNITY)
Admission: EM | Admit: 2013-09-22 | Discharge: 2013-09-22 | Disposition: A | Discharge status: Home / Self Care | Payer: Medicare Other | Attending: Emergency Medicine | Admitting: Emergency Medicine

## 2013-09-22 ENCOUNTER — Encounter (HOSPITAL_COMMUNITY): Payer: Self-pay | Admitting: Emergency Medicine

## 2013-09-22 DIAGNOSIS — I10 Essential (primary) hypertension: Secondary | ICD-10-CM | POA: Insufficient documentation

## 2013-09-22 DIAGNOSIS — H9190 Unspecified hearing loss, unspecified ear: Secondary | ICD-10-CM | POA: Insufficient documentation

## 2013-09-22 DIAGNOSIS — L02214 Cutaneous abscess of groin: Secondary | ICD-10-CM

## 2013-09-22 DIAGNOSIS — Z862 Personal history of diseases of the blood and blood-forming organs and certain disorders involving the immune mechanism: Secondary | ICD-10-CM | POA: Insufficient documentation

## 2013-09-22 DIAGNOSIS — Z85038 Personal history of other malignant neoplasm of large intestine: Secondary | ICD-10-CM | POA: Insufficient documentation

## 2013-09-22 DIAGNOSIS — F0390 Unspecified dementia without behavioral disturbance: Secondary | ICD-10-CM | POA: Insufficient documentation

## 2013-09-22 DIAGNOSIS — E119 Type 2 diabetes mellitus without complications: Secondary | ICD-10-CM | POA: Insufficient documentation

## 2013-09-22 DIAGNOSIS — L02219 Cutaneous abscess of trunk, unspecified: Secondary | ICD-10-CM | POA: Insufficient documentation

## 2013-09-22 DIAGNOSIS — Z8546 Personal history of malignant neoplasm of prostate: Secondary | ICD-10-CM | POA: Insufficient documentation

## 2013-09-22 DIAGNOSIS — Z79899 Other long term (current) drug therapy: Secondary | ICD-10-CM | POA: Insufficient documentation

## 2013-09-22 DIAGNOSIS — F329 Major depressive disorder, single episode, unspecified: Secondary | ICD-10-CM | POA: Insufficient documentation

## 2013-09-22 DIAGNOSIS — IMO0002 Reserved for concepts with insufficient information to code with codable children: Secondary | ICD-10-CM | POA: Insufficient documentation

## 2013-09-22 DIAGNOSIS — L03319 Cellulitis of trunk, unspecified: Principal | ICD-10-CM

## 2013-09-22 DIAGNOSIS — F3289 Other specified depressive episodes: Secondary | ICD-10-CM | POA: Insufficient documentation

## 2013-09-22 LAB — CEA: CEA: 17 ng/mL — ABNORMAL HIGH (ref 0.0–5.0)

## 2013-09-22 MED ORDER — SULFAMETHOXAZOLE-TRIMETHOPRIM 800-160 MG PO TABS: 1.0000 | ORAL_TABLET | Freq: Two times a day (BID) | ORAL | Status: DC

## 2013-09-22 MED ORDER — BUPIVACAINE HCL (PF) 0.25 % IJ SOLN
20.0000 mL | Freq: Once | INTRAMUSCULAR | Status: DC
  Filled 2013-09-22: qty 30

## 2013-09-22 MED ORDER — HYDROCODONE-ACETAMINOPHEN 5-325 MG PO TABS: 1.0000 | ORAL_TABLET | Freq: Four times a day (QID) | ORAL | Status: DC | PRN

## 2013-09-22 NOTE — ED Notes (Signed)
Resident of [facility]. Pain and swelling to left groin area first noticed this morning.

## 2013-09-22 NOTE — Discharge Instructions (Signed)
Give him the antibiotic until gone. He can have the hydrocodone for pain if needed. Return to the ED if he gets a fever, vomiting, worsening pain or swelling.     Abscess Care After An abscess (also called a boil or furuncle) is an infected area that contains a collection of pus. Signs and symptoms of an abscess include pain, tenderness, redness, or hardness, or you may feel a moveable soft area under your skin. An abscess can occur anywhere in the body. The infection may spread to surrounding tissues causing cellulitis. A cut (incision) by the surgeon was made over your abscess and the pus was drained out. Gauze may have been packed into the space to provide a drain that will allow the cavity to heal from the inside outwards. The boil may be painful for 5 to 7 days. Most people with a boil do not have high fevers. Your abscess, if seen early, may not have localized, and may not have been lanced. If not, another appointment may be required for this if it does not get better on its own or with medications. HOME CARE INSTRUCTIONS   Only take over-the-counter or prescription medicines for pain, discomfort, or fever as directed by your caregiver.  When you bathe, soak and then remove gauze or iodoform packs at least daily or as directed by your caregiver. You may then wash the wound gently with mild soapy water. Repack with gauze or do as your caregiver directs. SEEK IMMEDIATE MEDICAL CARE IF:   You develop increased pain, swelling, redness, drainage, or bleeding in the wound site.  You develop signs of generalized infection including muscle aches, chills, fever, or a general ill feeling.  An oral temperature above 102 F (38.9 C) develops, not controlled by medication. See your caregiver for a recheck if you develop any of the symptoms described above. If medications (antibiotics) were prescribed, take them as directed. Document Released: 10/17/2004 Document Revised: 06/23/2011 Document Reviewed:  06/14/2007 [facility] Patient Information 2014 [place].

## 2013-09-22 NOTE — ED Provider Notes (Signed)
CSN: [id number]     Arrival date & time 09/22/13  5462 History  This chart was scribed for Xavier Norrie, MD by Eston Mould, ED Scribe. This patient was seen in room APA19/APA19 and the patient's care was started at 8:49 AM.    Chief Complaint  Patient presents with  . Groin Pain   The history is provided by a caregiver and the patient. The history is limited by the condition of the patient. No language interpreter was used.  LEVEL 5 CAVEAT- DUE TO DEMENTIA HPI Comments: Xavier Tanner is a 63 y.o. male with a hx of dementia, HTN, prostate and colon cancer who presents to the Emergency Department complaining of acute groin pain that began this morning. Nursing home caregiver of pt states pt began complaining of L sided groin pain all morning. Pt states it hurts to have his clothes on in that area. Caregiver states pt was seen yesterday at [facility] by oncologist. Caregiver denies pt complaining of nausea, emesis, new fevers, difficulty urinating. Pt has hx of constipation.   PCP: Dr. Moshe Cipro.  Past Medical History  Diagnosis Date  . Urinary incontinence   . Psychotic disorder   . Diabetes mellitus, type 2   . Hypertension   . Bilateral cataracts 2011  . Vitamin D deficiency   . Mental retardation   . Depression   . Microcytic anemia 12/03/2012  . HOH (hard of hearing)   . Prostate cancer   . 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  . Dementia    Past Surgical History  Procedure Laterality Date  . Hemorroidectomy  2002  . Transurethral resection of prostate  July 09, 2007  . Colonoscopy N/A 12/04/2012    Procedure: COLONOSCOPY;  Surgeon: Daneil Dolin, MD;  Location: [facility];  Service: Endoscopy;  Laterality: N/A;  . Esophagogastroduodenoscopy N/A 12/04/2012    Procedure: ESOPHAGOGASTRODUODENOSCOPY (EGD);  Surgeon: Daneil Dolin, MD;  Location: [facility];  Service: Endoscopy;  Laterality: N/A;  . Colon resection N/A 12/15/2012     Procedure: HAND ASSISTED LAPAROSCOPIC PARTIAL COLECTOMY;  Surgeon: Donato Heinz, MD;  Location: [facility] ORS;  Service: General;  Laterality: N/A;  . Colon resection N/A 12/15/2012    Procedure: COLON RESECTION;  Surgeon: Donato Heinz, MD;  Location: [facility] ORS;  Service: General;  Laterality: N/A;   Family History  Problem Relation Age of Onset  . Mental illness Mother   . Hypertension Mother   . Diabetes Mother   . Stroke Father    History  Substance Use Topics  . Smoking status: Never Smoker   . Smokeless tobacco: Never Used  . Alcohol Use: No  lives in group home  Review of Systems  Unable to perform ROS: Dementia   Allergies  Ace inhibitors  Home Medications   Prior to Admission medications   Medication Sig Start Date End Date Taking? Authorizing Provider  bisacodyl (DULCOLAX) 5 MG EC tablet Take 5 mg by mouth 3 (three) times daily as needed for constipation.    Historical Provider, MD  capecitabine (XELODA) 500 MG tablet Take 2 tablets (1,000 mg total) by mouth 2 (two) times daily after a meal. On for 7 days, off for 7 days 09/02/13   Baird Cancer, PA-C  ciprofloxacin (CIPRO) 500 MG tablet Take 1 tablet (500 mg total) by mouth every 12 (twelve) hours. 08/23/13   Merryl Hacker, MD  citalopram (CELEXA) 20 MG tablet Take 30 mg  by mouth daily.    Historical Provider, MD  clonazePAM (KLONOPIN) 0.5 MG tablet Take 0.5 mg by mouth 3 (three) times daily as needed for anxiety.    Historical Provider, MD  fluticasone (FLONASE) 50 MCG/ACT nasal spray Place 1 spray into both nostrils daily.    Historical Provider, MD  gabapentin (NEURONTIN) 100 MG capsule Take 100 mg by mouth at bedtime. May increase up to 3 capsules if needed    Historical Provider, MD  hydrochlorothiazide (MICROZIDE) 12.5 MG capsule Take 1 capsule (12.5 mg total) by mouth daily. 06/29/13   Fayrene Helper, MD  hydrocortisone cream 1 % Apply 1 application topically 3 (three) times daily. Apply to affected area     Historical Provider, MD  magnesium hydroxide (MILK OF MAGNESIA) 400 MG/5ML suspension Take 30 mLs by mouth 3 (three) times daily as needed for constipation (If no bowel movement after 3 days may repeat x3 doses).    Historical Provider, MD  Memantine HCl ER (NAMENDA XR) 28 MG CP24 Take 28 mg by mouth daily.    Historical Provider, MD  omeprazole (PRILOSEC) 20 MG capsule Take 20 mg by mouth daily.    Historical Provider, MD  oxybutynin (DITROPAN-XL) 10 MG 24 hr tablet Take 10 mg by mouth at bedtime.    Historical Provider, MD  pravastatin (PRAVACHOL) 40 MG tablet TAKE 1 TABLET BY MOUTH AT BEDTIME. 07/21/13   Fayrene Helper, MD  QUEtiapine (SEROQUEL XR) 50 MG TB24 24 hr tablet Take 300 mg by mouth daily.    Historical Provider, MD  tamsulosin (FLOMAX) 0.4 MG CAPS capsule Take 0.4 mg by mouth daily.    Historical Provider, MD  terbinafine (LAMISIL) 1 % cream Apply 1 application topically 2 (two) times daily as needed.     Historical Provider, MD   BP 130/83  Pulse 84  Temp(Src) 97.3 F (36.3 C) (Oral)  Resp 16  SpO2 100%  Vital signs normal    Physical Exam  Nursing note and vitals reviewed. Constitutional: He appears well-developed and well-nourished.  Non-toxic appearance. He does not appear ill. No distress.  HENT:  Head: Normocephalic and atraumatic.  Right Ear: External ear normal.  Left Ear: External ear normal.  Nose: Nose normal. No mucosal edema or rhinorrhea.  Mouth/Throat: Oropharynx is clear and moist and mucous membranes are normal. No dental abscesses or uvula swelling.  Eyes: Conjunctivae and EOM are normal. Pupils are equal, round, and reactive to light.  Neck: Normal range of motion and full passive range of motion without pain. Neck supple.  Cardiovascular: Normal rate, regular rhythm and normal heart sounds.  Exam reveals no gallop and no friction rub.   No murmur heard. Pulmonary/Chest: Effort normal and breath sounds normal. No respiratory distress. He has no  wheezes. He has no rhonchi. He has no rales. He exhibits no tenderness and no crepitus.  Abdominal: Soft. Normal appearance and bowel sounds are normal. He exhibits distension. There is no tenderness. There is no rebound and no guarding.  Genitourinary: Penis normal.     Scrotum normal, no swelling. Area of abscess noted. Pt has induration of the subcutaneous tissues and the skin is thinned in a well localized area, like the abscess is trying to drain.   Musculoskeletal: Normal range of motion. He exhibits no edema and no tenderness.  Moves all extremities well.   Neurological: He is alert. He has normal strength. No cranial nerve deficit.  Pt pleasant, cooperative  Skin: Skin is warm, dry and  intact. No rash noted. No erythema. No pallor.  Has a 2 x 3 cm abscess to L mons.  Psychiatric: He has a normal mood and affect. His speech is normal and behavior is normal. His mood appears not anxious.   ED Course  Procedures     DIAGNOSTIC STUDIES: Oxygen Saturation is 100% on RA, normal by my interpretation.    COORDINATION OF CARE: 8:52 AM-Discussed treatment plan which includes incision and drainage to abscess in L groin. Caregiver of pt agreed to plan.   I and D done by PA [employer]  Labs Review      Imaging Review No results found.   EKG Interpretation None     MDM   Final diagnoses:  Abscess of groin, left    New Prescriptions   HYDROCODONE-ACETAMINOPHEN (NORCO/VICODIN) 5-325 MG PER TABLET    Take 1 tablet by mouth every 6 (six) hours as needed for moderate pain.   SULFAMETHOXAZOLE-TRIMETHOPRIM (SEPTRA DS) 800-160 MG PER TABLET    Take 1 tablet by mouth every 12 (twelve) hours.    Plan discharge  Rolland Porter, MD, FACEP   I personally performed the services described in this documentation, which was scribed in my presence. The recorded information has been reviewed and considered.  Rolland Porter, MD, Abram Sander    Xavier Norrie, MD 09/22/13 [phone]

## 2013-09-22 NOTE — ED Provider Notes (Signed)
Pt seen by me, I & D done by PA under my supervision.    Rolland Porter, MD, Abram Sander   [name], MD 09/22/13 [phone]

## 2013-09-22 NOTE — ED Provider Notes (Signed)
INCISION AND DRAINAGE OF ABSCESS LEFT GROIN.  The patient is a 63 year old resident of [facility]. He presents to the emergency department with his caregiver with a complaint of left groin pain. The caregiver states there was no mention of this until this morning. The examination reveals a small abscess at the left mons of the groin. The patient suffers from dementia and mental retardation. The patient for  incision and drainage procedure was given by the caregiver. The procedure was explained to the caregiver in terms  she understood. Attempts were made explaining the procedure to the patient as well. Patient was identified arm band. Procedural time out taken before beginning procedure. The skin was cleansed with chlorhexidine. The area was infiltrated with 0.25% plain Sensorcaine. The area was incised with a #15 scalpel. A moderate amount of pus like material was removed. A culture was obtained. Sterile dressing was applied.  The instructions for cleansing the wound area was given to the caregiver. The patient acknowledges understanding of the discharge instructions.    Lenox Ahr, PA-C 09/22/13 [phone]

## 2013-09-25 LAB — CULTURE, ROUTINE-ABSCESS

## 2013-09-27 ENCOUNTER — Emergency Department: Payer: Self-pay | Admitting: Emergency Medicine

## 2013-09-27 LAB — COMPREHENSIVE METABOLIC PANEL
ALK PHOS: 83 U/L
Albumin: 4.2 g/dL (ref 3.4–5.0)
Anion Gap: 6 — ABNORMAL LOW (ref 7–16)
BILIRUBIN TOTAL: 0.7 mg/dL (ref 0.2–1.0)
BUN: 20 mg/dL — AB (ref 7–18)
CALCIUM: 9.1 mg/dL (ref 8.5–10.1)
CREATININE: 1.12 mg/dL (ref 0.60–1.30)
Chloride: 106 mmol/L (ref 98–107)
Co2: 24 mmol/L (ref 21–32)
EGFR (African American): >60
EGFR (Non-African Amer.): >60
Glucose: 91 mg/dL (ref 65–99)
Osmolality: 274 (ref 275–301)
Potassium: 4 mmol/L (ref 3.5–5.1)
SGOT(AST): 38 U/L — ABNORMAL HIGH (ref 15–37)
SGPT (ALT): 21 U/L (ref 12–78)
Sodium: 136 mmol/L (ref 136–145)
TOTAL PROTEIN: 8.9 g/dL — AB (ref 6.4–8.2)

## 2013-09-27 LAB — ETHANOL

## 2013-09-27 LAB — CBC
HCT: 36 % — ABNORMAL LOW (ref 40.0–52.0)
HGB: 11.6 g/dL — AB (ref 13.0–18.0)
MCH: 24.9 pg — ABNORMAL LOW (ref 26.0–34.0)
MCHC: 32.1 g/dL (ref 32.0–36.0)
MCV: 78 fL — ABNORMAL LOW (ref 80–100)
Platelet: 209 10*3/uL (ref 150–440)
RBC: 4.65 10*6/uL (ref 4.40–5.90)
RDW: 27.3 % — AB (ref 11.5–14.5)
WBC: 7.7 10*3/uL (ref 3.8–10.6)

## 2013-09-27 LAB — ACETAMINOPHEN LEVEL: Acetaminophen: <2 ug/mL

## 2013-09-27 LAB — SALICYLATE LEVEL

## 2013-09-27 NOTE — Telephone Encounter (Signed)
Post ED Visit - Positive Culture Follow-up  Culture report reviewed by antimicrobial stewardship pharmacist: []  Wes Whitecone, Pharm.D., BCPS [x]  Heide Guile, Pharm.D., BCPS []  Alycia Rossetti, Pharm.D., BCPS []  [place], Pharm.D., BCPS, AAHIVP []  Legrand Como, Pharm.D., BCPS, AAHIVP []  Juliene Pina, Pharm.D.  Positive Abscess culture Treated with Sulfa-Trimeth, organism sensitive to the same and no further patient follow-up is required at this time.  [place], Rex Kras 09/27/2013, 2:08 PM

## 2013-09-28 LAB — DRUG SCREEN, URINE
Amphetamines, Ur Screen: NEGATIVE (ref ?–1000)
Barbiturates, Ur Screen: NEGATIVE (ref ?–200)
Benzodiazepine, Ur Scrn: NEGATIVE (ref ?–200)
CANNABINOID 50 NG, UR ~~LOC~~: NEGATIVE (ref ?–50)
COCAINE METABOLITE, UR ~~LOC~~: NEGATIVE (ref ?–300)
MDMA (Ecstasy)Ur Screen: NEGATIVE (ref ?–500)
Methadone, Ur Screen: NEGATIVE (ref ?–300)
Opiate, Ur Screen: NEGATIVE (ref ?–300)
PHENCYCLIDINE (PCP) UR S: NEGATIVE (ref ?–25)
Tricyclic, Ur Screen: NEGATIVE (ref ?–1000)

## 2013-09-28 LAB — URINALYSIS, COMPLETE
BILIRUBIN, UR: NEGATIVE
BLOOD: NEGATIVE
Bacteria: NONE SEEN
GLUCOSE, UR: NEGATIVE mg/dL (ref 0–75)
KETONE: NEGATIVE
Leukocyte Esterase: NEGATIVE
Nitrite: NEGATIVE
Ph: 6 (ref 4.5–8.0)
Protein: NEGATIVE
RBC,UR: NONE SEEN /HPF (ref 0–5)
Specific Gravity: 1.019 (ref 1.003–1.030)
Squamous Epithelial: <1
WBC UR: 1 /HPF (ref 0–5)

## 2013-10-13 ENCOUNTER — Ambulatory Visit (INDEPENDENT_AMBULATORY_CARE_PROVIDER_SITE_OTHER): Payer: Medicare Other | Admitting: Family Medicine

## 2013-10-13 ENCOUNTER — Encounter: Payer: Self-pay | Admitting: Family Medicine

## 2013-10-13 VITALS — BP 138/80 | HR 78 | Resp 16 | Wt 158.8 lb

## 2013-10-13 DIAGNOSIS — E559 Vitamin D deficiency, unspecified: Secondary | ICD-10-CM

## 2013-10-13 DIAGNOSIS — R32 Unspecified urinary incontinence: Secondary | ICD-10-CM

## 2013-10-13 DIAGNOSIS — E119 Type 2 diabetes mellitus without complications: Secondary | ICD-10-CM

## 2013-10-13 DIAGNOSIS — F3289 Other specified depressive episodes: Secondary | ICD-10-CM

## 2013-10-13 DIAGNOSIS — F29 Unspecified psychosis not due to a substance or known physiological condition: Secondary | ICD-10-CM

## 2013-10-13 DIAGNOSIS — F329 Major depressive disorder, single episode, unspecified: Secondary | ICD-10-CM

## 2013-10-13 DIAGNOSIS — I10 Essential (primary) hypertension: Secondary | ICD-10-CM

## 2013-10-13 DIAGNOSIS — Z8546 Personal history of malignant neoplasm of prostate: Secondary | ICD-10-CM

## 2013-10-13 DIAGNOSIS — F32A Depression, unspecified: Secondary | ICD-10-CM

## 2013-10-13 DIAGNOSIS — C182 Malignant neoplasm of ascending colon: Secondary | ICD-10-CM

## 2013-10-13 DIAGNOSIS — Z23 Encounter for immunization: Secondary | ICD-10-CM

## 2013-10-13 DIAGNOSIS — E785 Hyperlipidemia, unspecified: Secondary | ICD-10-CM

## 2013-10-13 DIAGNOSIS — B353 Tinea pedis: Secondary | ICD-10-CM

## 2013-10-13 DIAGNOSIS — R5383 Other fatigue: Secondary | ICD-10-CM

## 2013-10-13 DIAGNOSIS — R5381 Other malaise: Secondary | ICD-10-CM

## 2013-10-13 MED ORDER — CLOTRIMAZOLE-BETAMETHASONE 1-0.05 % EX CREA: TOPICAL_CREAM | CUTANEOUS | Status: AC

## 2013-10-13 NOTE — Progress Notes (Signed)
   Subjective:    Patient ID: Xavier Tanner, male    DOB: 1950-12-12, 63 y.o.   MRN: [id number]  HPI Pt hospitalized at [facility] due to uncontrolled behavior in mid June for 3 days. His seroquel and  Medication for dementia was discontinued, and he saw his psychiatrist at [facility] yesterday, and subsequently had a med adjustment made again Decision is that he move to a higher level of care, has conference in the next week about  This Appetite is improved with weight gain   Review of Systems See HPI, history from accompanying staff member, pt is mentally challenged and incapable of providing  a reliable history Denies recent fever or chills. Denies sinus pressure, nasal congestion, ear pain or sore throat. Denies chest congestion, productive cough or wheezing. Denies chest pains, palpitations and leg swelling Denies abdominal pain, nausea, vomiting,diarrhea or constipation.   Denies dysuria, frequency, hesitancy or incontinence. Denies joint pain, swelling and limitation in mobility. Denies headaches, seizures, numbness, or tingling.  Denies skin break down or rash.        Objective:   Physical Exam BP 138/80  Pulse 78  Resp 16  Wt 158 lb 12.8 oz (72.031 kg)  SpO2 97% Patient alert and oriented and in no cardiopulmonary distress.  HEENT: No facial asymmetry, EOMI,   oropharynx pink and moist.  Neck supple no JVD, no mass.  Chest: Clear to auscultation bilaterally.  CVS: S1, S2 no murmurs, no S3.Regular rate.  ABD: Soft non tender.   Ext: No edema  MS: Adequate ROM spine, shoulders, hips and knees.  Skin: fungal infection between toes  Psych: Good eye contact, flat affect. Mentally retarded not anxious or depressed appearing.  CNS: CN 2-12 intact, power,  normal throughout.no focal deficits noted.        Assessment & Plan:

## 2013-10-13 NOTE — Patient Instructions (Signed)
F/u ias needed, I understand that you will be at a new facility, so I will likely not see you anymore.  We will miss you and wish you all the best Prevnar today   Microalb and foot exam today  NEED TO USE NEW CREAM BETWEEN TOES TWICE DAILY FOR 3 WEEKS  NEED TO BRING IN MEDICATION FOR US TO GET CORRECT MEDICATIONS BEFORE Fl2 CAN BE COMPLETED  Fasting labs next week, lipid, cmp and EGFr, hBA1C, tSH, vit D  Check the pharmacy for TdAP , you NEED this 

## 2013-10-14 LAB — MICROALBUMIN / CREATININE URINE RATIO
CREATININE, URINE: 19.8 mg/dL
Microalb Creat Ratio: 25.3 mg/g (ref 0.0–30.0)
Microalb, Ur: 0.5 mg/dL (ref 0.00–1.89)

## 2013-10-17 ENCOUNTER — Other Ambulatory Visit: Payer: Self-pay | Admitting: Family Medicine

## 2013-10-19 ENCOUNTER — Other Ambulatory Visit: Payer: Self-pay

## 2013-10-19 MED ORDER — HYDROCHLOROTHIAZIDE 12.5 MG PO CAPS: 12.5000 mg | ORAL_CAPSULE | Freq: Every day | ORAL | Status: AC

## 2013-10-20 ENCOUNTER — Telehealth: Payer: Self-pay | Admitting: Family Medicine

## 2013-10-20 DIAGNOSIS — K219 Gastro-esophageal reflux disease without esophagitis: Secondary | ICD-10-CM | POA: Insufficient documentation

## 2013-10-21 LAB — COMPLETE METABOLIC PANEL WITH GFR
ALK PHOS: 62 U/L (ref 39–117)
ALT: 12 U/L (ref 0–53)
AST: 22 U/L (ref 0–37)
Albumin: 4.1 g/dL (ref 3.5–5.2)
BUN: 16 mg/dL (ref 6–23)
CO2: 26 mEq/L (ref 19–32)
Calcium: 9.4 mg/dL (ref 8.4–10.5)
Chloride: 103 mEq/L (ref 96–112)
Creat: 0.67 mg/dL (ref 0.50–1.35)
GFR, Est African American: >89 mL/min
GFR, Est Non African American: >89 mL/min
Glucose, Bld: 85 mg/dL (ref 70–99)
POTASSIUM: 4.2 meq/L (ref 3.5–5.3)
SODIUM: 137 meq/L (ref 135–145)
TOTAL PROTEIN: 7.2 g/dL (ref 6.0–8.3)
Total Bilirubin: 1.3 mg/dL — ABNORMAL HIGH (ref 0.2–1.2)

## 2013-10-21 LAB — LIPID PANEL
CHOL/HDL RATIO: 2.1 ratio
Cholesterol: 155 mg/dL (ref 0–200)
HDL: 75 mg/dL (ref >39)
LDL Cholesterol: 68 mg/dL (ref 0–99)
Triglycerides: 60 mg/dL (ref <150)
VLDL: 12 mg/dL (ref 0–40)

## 2013-10-21 LAB — VITAMIN D 25 HYDROXY (VIT D DEFICIENCY, FRACTURES): VIT D 25 HYDROXY: 47 ng/mL (ref 30–89)

## 2013-10-21 LAB — HEMOGLOBIN A1C
Hgb A1c MFr Bld: 5.4 % (ref <5.7)
Mean Plasma Glucose: 108 mg/dL (ref <117)

## 2013-10-21 LAB — TSH: TSH: 1.379 u[IU]/mL (ref 0.350–4.500)

## 2013-10-24 ENCOUNTER — Encounter (HOSPITAL_COMMUNITY): Payer: Self-pay

## 2013-10-25 ENCOUNTER — Other Ambulatory Visit: Payer: Self-pay | Admitting: Family Medicine

## 2013-10-25 NOTE — Telephone Encounter (Signed)
Called and left voicemail notifying that facility can bring fl2 back by office.

## 2013-11-02 ENCOUNTER — Ambulatory Visit
Admission: RE | Admit: 2013-11-02 | Discharge: 2013-11-02 | Disposition: A | Discharge status: Home / Self Care | Payer: Medicare Other | Source: Ambulatory Visit | Attending: Hematology and Oncology | Admitting: Hematology and Oncology

## 2013-11-02 DIAGNOSIS — R160 Hepatomegaly, not elsewhere classified: Secondary | ICD-10-CM

## 2013-11-02 MED ORDER — GADOBENATE DIMEGLUMINE 529 MG/ML IV SOLN
14.0000 mL | Freq: Once | INTRAVENOUS | Status: AC | PRN
  Administered 2013-11-02: 14 mL via INTRAVENOUS

## 2013-11-20 NOTE — Assessment & Plan Note (Signed)
Followed by urologist , Dr Michela Pitcher

## 2013-11-20 NOTE — Assessment & Plan Note (Signed)
Bilateral infection of both feet in toe webs , topical antifungal twice daily for 3 weeks

## 2013-11-20 NOTE — Assessment & Plan Note (Signed)
Controlled, no change in medication  

## 2013-11-20 NOTE — Assessment & Plan Note (Signed)
Controlled, no change in medication Hyperlipidemia:Low fat diet discussed and encouraged.  \ 

## 2013-11-20 NOTE — Assessment & Plan Note (Signed)
On multiple medications for this and followed by his urologist also

## 2013-11-20 NOTE — Assessment & Plan Note (Signed)
Followed by oncology, has completed 6 months of chemotherapy and had reesction of the cancer in 2014

## 2013-11-20 NOTE — Assessment & Plan Note (Signed)
Recent extreme aggression required hospitalization, improved since d/c but pt to be placed in a facility with higher level of care

## 2013-11-20 NOTE — Assessment & Plan Note (Signed)
Controlled, diet only No change in management

## 2013-11-20 NOTE — Assessment & Plan Note (Signed)
Recently required hospitalization due toi uncontrolled behavior, since his return seems stable , but n needs his medications reviewed by his psychiatrist who he sees in the next couple of days. Meds were changed during hospital stay , and wisdom of this is questioned by facilitty staff

## 2013-11-23 ENCOUNTER — Ambulatory Visit: Payer: Medicare Other | Admitting: Family Medicine

## 2013-12-06 ENCOUNTER — Ambulatory Visit (INDEPENDENT_AMBULATORY_CARE_PROVIDER_SITE_OTHER): Payer: Medicare Other

## 2013-12-06 ENCOUNTER — Encounter (INDEPENDENT_AMBULATORY_CARE_PROVIDER_SITE_OTHER): Payer: Self-pay

## 2013-12-06 VITALS — BP 118/80 | Wt 164.8 lb

## 2013-12-06 DIAGNOSIS — Z111 Encounter for screening for respiratory tuberculosis: Secondary | ICD-10-CM

## 2013-12-06 NOTE — Progress Notes (Signed)
Patient in to have PPD placed per facility. Advised to come back Thursday to be read

## 2013-12-08 LAB — TB SKIN TEST
Induration: 0 mm
TB Skin Test: NEGATIVE

## 2014-01-24 ENCOUNTER — Other Ambulatory Visit (HOSPITAL_COMMUNITY): Payer: Medicare Other

## 2014-01-24 ENCOUNTER — Ambulatory Visit (HOSPITAL_COMMUNITY): Payer: Medicare Other

## 2014-02-03 ENCOUNTER — Encounter (HOSPITAL_COMMUNITY): Payer: Self-pay

## 2014-04-21 ENCOUNTER — Telehealth: Payer: Self-pay | Admitting: Family Medicine

## 2014-04-27 NOTE — Telephone Encounter (Signed)
Left message for Xavier Tanner to return call

## 2014-06-06 ENCOUNTER — Other Ambulatory Visit (INDEPENDENT_AMBULATORY_CARE_PROVIDER_SITE_OTHER): Payer: Self-pay | Admitting: General Surgery

## 2014-06-06 ENCOUNTER — Other Ambulatory Visit (INDEPENDENT_AMBULATORY_CARE_PROVIDER_SITE_OTHER): Payer: Self-pay | Admitting: *Deleted

## 2014-06-06 DIAGNOSIS — C6292 Malignant neoplasm of left testis, unspecified whether descended or undescended: Secondary | ICD-10-CM

## 2014-06-06 DIAGNOSIS — K4090 Unilateral inguinal hernia, without obstruction or gangrene, not specified as recurrent: Secondary | ICD-10-CM

## 2014-06-06 NOTE — Addendum Note (Signed)
Addended by: Adin Hector on: 06/06/2014 09:47 AM   Modules accepted: Orders

## 2014-06-09 ENCOUNTER — Ambulatory Visit
Admission: RE | Admit: 2014-06-09 | Discharge: 2014-06-09 | Disposition: A | Discharge status: Home / Self Care | Payer: Medicare Other | Source: Ambulatory Visit | Attending: General Surgery | Admitting: General Surgery

## 2014-06-09 MED ORDER — IOHEXOL 300 MG/ML  SOLN
100.0000 mL | Freq: Once | INTRAMUSCULAR | Status: AC | PRN
  Administered 2014-06-09: 100 mL via INTRAVENOUS

## 2014-06-22 ENCOUNTER — Encounter (HOSPITAL_COMMUNITY): Payer: Self-pay | Admitting: Lab

## 2014-06-22 ENCOUNTER — Encounter (HOSPITAL_COMMUNITY): Payer: Self-pay | Admitting: Oncology

## 2014-06-22 ENCOUNTER — Encounter (HOSPITAL_COMMUNITY): Payer: Medicare Other | Attending: Oncology | Admitting: Oncology

## 2014-06-22 VITALS — BP 136/77 | HR 95 | Temp 97.4°F | Resp 16 | Wt 167.8 lb

## 2014-06-22 DIAGNOSIS — C799 Secondary malignant neoplasm of unspecified site: Secondary | ICD-10-CM

## 2014-06-22 DIAGNOSIS — C801 Malignant (primary) neoplasm, unspecified: Secondary | ICD-10-CM | POA: Diagnosis not present

## 2014-06-22 HISTORY — DX: Secondary malignant neoplasm of unspecified site: C79.9

## 2014-06-22 NOTE — Assessment & Plan Note (Signed)
New finding of abnormal CT imaging that identifies widespread intraabdominal malignancy. He has a history of Prostate cancer and Stage IIA adenocarcinoma of colon, S/P definitive surgery by Dr. Geroge Baseman on 12/15/2012 demonstrating a microsatellite stable cancer with an OncoType DX Colon score of 52 revealing a 19% recurrence of disease over 5 years.  He was treated adjuvantly with Xeloda 1000 mg BID 14 days on and 7 days off starting on 03/24/2013 with a dose adjustment on 05/23/2013 due to palmar-plantar erythrodysesthesia to 1000 mg BID 7 days on and 7 days off finishing on 09/21/2013.  Now presenting with widespread intraabdominal malignancy found on recent CT abd/pelvis ordered by Dr. Dalbert Batman after previous imaging in the past suspicious for hepatic lesions.  Today's note provides a chronological outline of imaging tests and findings as dictated above.  Today is the first time I have met the patient.  He is not a candidate for aggressive systemic chemotherapy and given the unlikelihood that he has small cell lung cancer or Non-Hodgkin's lymphoma, utility of treatment in this patient is not good as it will be detrimental his quality of life (QOL).  Therefore, we will not pursue biopsy or more staging tests.  Social worker accompanying the patient is agreeable to this plan of care as she too agrees that aggressive intervention is not in the patient's best interest.  She is clearly looking out for the patient's best interest during our conversation and she asked appropriate questions.  She is emotionally attached to the patient as well which is well received.  No medications are prescribed today due to the patient's lack of complaints and symptoms.  I will refer the patient to Hospice.  Xavier Tanner has a terminal diagnosis and less than 6 months of life expected.

## 2014-06-22 NOTE — Progress Notes (Signed)
Referral sent to Hospice.  Records faxed on 3/10

## 2014-06-22 NOTE — Patient Instructions (Signed)
[place] at [facility]  Discharge Instructions:  Unfortunately, you have metastatic malignancy.  This is not biopsy proven, but imaging tests from Feb 2016 are very worrisome for malignancy.  Stage IV malignancies are not curable and given this, aggressive systemic chemotherapy is contraindicated as it will likely compromise your quality of life and not prove to be extremely beneficial.  We will consult Hospice to help maintain your quality of life and manage any symptoms of your cancer.  Please call the [facility] with any questions or concerns: [phone]. _______________________________________________________________  Thank you for choosing [place] at [facility] to provide your oncology and hematology care.  To afford each patient quality time with our providers, please arrive at least 15 minutes before your scheduled appointment.  You need to re-schedule your appointment if you arrive 10 or more minutes late.  We strive to give you quality time with our providers, and arriving late affects you and other patients whose appointments are after yours.  Also, if you no show three or more times for appointments you may be dismissed from the clinic.  Again, thank you for choosing [place] [facility] hope is that these requests will allow you access to exceptional care and in a timely manner. _______________________________________________________________  If you have questions after your visit, please contact our office at [phone] between the hours of 8:30 a.m. and 5:00 p.m. Voicemails left after 4:30 p.m. will not be returned until the following business day. _______________________________________________________________  For prescription refill requests, have your pharmacy contact our office. _______________________________________________________________  Recommendations made by the  consultant and any test results will be sent to your referring physician. _______________________________________________________________

## 2014-06-22 NOTE — Progress Notes (Addendum)
Xavier Nakayama, MD [address], [address] [place] [address]  Metastatic neoplastic disease  CURRENT THERAPY: Surveillance per NCCN guidelines for Stage IIA adenocarcinoma of colon.  INTERVAL HISTORY: Xavier Tanner 64 y.o. male returns for new finding of abnormal CT imaging that identifies widespread intraabdominal malignancy. He has a history of Prostate cancer and Stage IIA adenocarcinoma of colon, S/P definitive surgery by Dr. Geroge Baseman on 12/15/2012 demonstrating a microsatellite stable cancer with an OncoType DX Colon score of 74 revealing a 19% recurrence of disease over 5 years.  He was treated adjuvantly with Xeloda 1000 mg BID 14 days on and 7 days off starting on 03/24/2013 with a dose adjustment on 05/23/2013 due to palmar-plantar erythrodysesthesia to 1000 mg BID 7 days on and 7 days off finishing on 09/21/2013.  Chart reviewed: I have not personally seen the patient, but on chart review, a CT abd/pelvis was performed on 06/11/2013 in the ED demonstrating a possible inferior right liver lobe lesion versus artifact.  This was followed as an outpatient with an [place] of RUQ by Dr. Moshe Cipro demonstrating a possible tumor in the inferior right hepatic lobe measuring 2.3 cm in largest dimension.  A short interval [place] of abdomen 4-6 weeks later on 07/25/2013 by Dr. Barnet Glasgow again demonstrates a possible right hepatic lobe lesion measuring 2.9 cm in largest dimension.  On 11/02/2013, the patient (after stopping Xeloda treatment due to completion of 6 months of adjuvant therapy in June 2015) underwent an MRI of abdomen ordered by Dr. Barnet Glasgow demonstrating:  1. Although severely compromised by motion artifact, today's MRI demonstrates 4 lesions suspicious for hepatic metastatic lesions. One of these is the lesion inferiorly in the right hepatic lobe previously observed. The other lesions are in the left hepatic lobe. 2. Centrally necrotic appearing pathologic retrocaval lymph  node, favoring malignancy. 3. Supraumbilical ventral hernia contains a small amount of transverse colon.  The patient missed his follow-up appointment as a no-show on 01/24/2014.  On 06/06/2014, the patient presented to Dr. Darrel Hoover office as a new patient for an inguinal hernia.  After his review of the patient's chart, he appropriately felt as though repeat imaging was indicated prior to any surgical intervention for the patient's inguinal hernia.  Therefore, he ordered a CT abd/pelvis and that was completed on 06/09/2014 which illustrated:  Widespread metastatic disease with bulky hepatic metastases. This is associated with bulky omental and gastrocolic ligament disease. Peritoneal implants are seen in the abdomen and pelvis and there are loculated areas of fluid within the peritoneal cavity of the abdomen and pelvis. Prominent fluid in the left inguinal canal is associated with areas of irregular enhancing soft tissue suggesting metastatic involvement of an inguinal hernia.  As a result of this imaging test, the patient was referred back to [facility].  HPI: The patient denies any complaints today including pain, nausea, and vomiting.  He denies any issues with his bowels.  He is accompanied by his Education officer, museum.  He currently resides a [employer].  He was treated as an inpatient in the recent past for psychiatric issues causing the patient to be violent.  He reports weight loss and decreased appetite.  Chart review and seeing the patient make further intervention futile given the Stage IV nature of this disease and the patient's co-morbidities. He would not be a candidate for intensive systemic chemotherapy which would likely be needed given the extent of his disease.  It  is unlikely that the patient has small cell lung cancer of NHL which respond rapidly to chemotherapeutic intervention.  From a QOL standpoint, the patient would be better served with supportive  care, comfort care, and Hospice intervention.  I had a wonderful conversation with the patient's social worker and she is agreeable to not be aggressive in the management of Xavier Tanner.  She notes that Xavier Tanner is incapable of communicating symptoms and relays that the only reason a testicular abnormality was noted was because he complained of an "itching butt."  She is agreeable to comfort care as well.    Past Medical History  Diagnosis Date  . Urinary incontinence   . Psychotic disorder   . Diabetes mellitus, type 2   . Hypertension   . Bilateral cataracts 2011  . Vitamin D deficiency   . Mental retardation   . Depression   . Microcytic anemia 12/03/2012  . HOH (hard of hearing)   . Prostate cancer   . 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  . Dementia   . Metastatic neoplastic disease 06/22/2014    Found on recent CT imaging at end of Feb 2016.    has ONYCHOMYCOSIS, TOENAILS; Unspecified vitamin D deficiency; HYPERLIPIDEMIA; Unspecified psychosis; HYPERTENSION; FATIGUE; URINARY INCONTINENCE; TINEA CRURIS; Tremor; Mental retardation; Hypokalemia; Microcytic anemia; Colon cancer, ascending; Type 2 diabetes, diet controlled; Depression; Anemia due to chronic blood loss; Dermatomycosis; Routine general medical examination at a health care facility; H/O prostate cancer; Tinea pedis; GERD (gastroesophageal reflux disease); and Metastatic neoplastic disease on his problem list.     is allergic to ace inhibitors.  Xavier Tanner does not currently have medications on file.  Past Surgical History  Procedure Laterality Date  . Hemorroidectomy  2002  . Transurethral resection of prostate  July 09, 2007  . Colonoscopy N/A 12/04/2012    Procedure: COLONOSCOPY;  Surgeon: Daneil Dolin, MD;  Location: [facility];  Service: Endoscopy;  Laterality: N/A;  . Esophagogastroduodenoscopy N/A 12/04/2012    Procedure: ESOPHAGOGASTRODUODENOSCOPY (EGD);  Surgeon: Daneil Dolin, MD;  Location: [facility];  Service: Endoscopy;  Laterality: N/A;  . Colon resection N/A 12/15/2012    Procedure: HAND ASSISTED LAPAROSCOPIC PARTIAL COLECTOMY;  Surgeon: Donato Heinz, MD;  Location: [facility];  Service: General;  Laterality: N/A;  . Colon resection N/A 12/15/2012    Procedure: COLON RESECTION;  Surgeon: Donato Heinz, MD;  Location: [facility];  Service: General;  Laterality: N/A;    Denies any headaches, dizziness, double vision, fevers, chills, night sweats, nausea, vomiting, diarrhea, constipation, chest pain, heart palpitations, shortness of breath, blood in stool, black tarry stool, urinary pain, urinary burning, urinary frequency, hematuria.   PHYSICAL EXAMINATION  ECOG PERFORMANCE STATUS: 2 - Symptomatic, <50% confined to bed  Filed Vitals:   06/22/14 0941  BP: 136/77  Pulse: 95  Temp: 97.4 F (36.3 C)  Resp: 16    GENERAL:alert, no distress, smiling and mentally handicapped, weight loss noticeable. SKIN: skin color, texture, turgor are normal, no rashes or significant lesions HEAD: Normocephalic, No masses, lesions, tenderness or abnormalities EYES: normal, PERRLA, EOMI, Conjunctiva are pink and non-injected EARS: External ears normal OROPHARYNX:lips, buccal mucosa, and tongue normal and mucous membranes are moist  NECK: supple, trachea midline BREAST:not examined LUNGS: not examined HEART: not examined ABDOMEN:abdomen soft, normal bowel sounds and distended with large ventral hernia and Xavier Tanner node appreciated. BACK: Back symmetric, no curvature. EXTREMITIES:less then 2 second capillary refill, no joint deformities,  effusion, or inflammation, no skin discoloration, no cyanosis  NEURO: no focal motor/sensory deficits, positive findings: mentally handicapped with dementia. Unable to appropriately communicate symptoms.   LABORATORY DATA: CBC    Component Value Date/Time   WBC 7.0 09/21/2013 1224   RBC 4.23 09/21/2013 1224   RBC 4.99 02/25/2013 0852   HGB  10.6* 09/21/2013 1224   HCT 31.9* 09/21/2013 1224   PLT 201 09/21/2013 1224   MCV 75.4* 09/21/2013 1224   MCH 25.1* 09/21/2013 1224   MCHC 33.2 09/21/2013 1224   RDW 25.4* 09/21/2013 1224   LYMPHSABS 1.9 09/21/2013 1224   MONOABS 0.5 09/21/2013 1224   EOSABS 0.0 09/21/2013 1224   BASOSABS 0.0 09/21/2013 1224      Chemistry      Component Value Date/Time   NA 137 10/20/2013 0950   K 4.2 10/20/2013 0950   CL 103 10/20/2013 0950   CO2 26 10/20/2013 0950   BUN 16 10/20/2013 0950   CREATININE 0.67 10/20/2013 0950   CREATININE 0.82 09/21/2013 1224      Component Value Date/Time   CALCIUM 9.4 10/20/2013 0950   ALKPHOS 62 10/20/2013 0950   AST 22 10/20/2013 0950   ALT 12 10/20/2013 0950   BILITOT 1.3* 10/20/2013 0950       RADIOGRAPHIC STUDIES:  Ct Abdomen Pelvis W Contrast  06/09/2014   CLINICAL DATA:  Subsequent encounter for colon cancer  EXAM: CT ABDOMEN AND PELVIS WITH CONTRAST  TECHNIQUE: Multidetector CT imaging of the abdomen and pelvis was performed using the standard protocol following bolus administration of intravenous contrast.  CONTRAST:  198m OMNIPAQUE IOHEXOL 300 MG/ML  SOLN  COMPARISON:  MRI from 11/02/2013.  CT scan from 06/11/2013.  FINDINGS: Lower chest: No pulmonary edema or focal airspace consolidation in the lung bases. Posterior right middle lobe pulmonary nodule measures about 4 mm and is unchanged.  Hepatobiliary: Multiple hypoenhancing lesions are seen throughout both lobes of the liver. Index lesion in the anterior aspect of the lateral segment left liver measures 4.9 cm. A second discrete index lesion is seen in the inferior tip of the liver measuring 4.7 cm. There are probably about 20 metastases in the liver parenchyma ranging in size from about 1 cm up to the 4.9 cm lesion.  Gallbladder is collapsed with tiny calcified stones visible in the lumen. There is trace intrahepatic biliary duct prominence with no dilatation of the extrahepatic biliary system.   Pancreas: No focal mass lesion. No dilatation of the main duct. No intraparenchymal cyst. No peripancreatic edema.  Spleen: No splenomegaly. No focal mass lesion.  Adrenals/Urinary Tract: No adrenal nodule or mass. No evidence for hydronephrosis or enhancing mass in either kidney. No hydroureter. Urinary bladder is nondistended.  Stomach/Bowel: Stomach is moderately distended. The duodenum is unremarkable. No small bowel wall thickening. No small bowel dilatation. Patient is status post right hemicolectomy. No colonic dilatation.  Vascular/Lymphatic: No abdominal aortic aneurysm.  There is fairly bulky omental disease. Bulky soft tissue is identified in the gastrocolic ligament. Bulky lymphadenopathy is seen in the hepatoduodenal ligament. Retroperitoneal lymphadenopathy is identified with a 2.0 x 3.5 cm aortocaval lymph node seen on image 36 of series 2. Multiple soft tissue lesions are seen along the liver capsule consistent with metastatic disease. There is enhancing soft tissue on the falciform ligament. Enhancing nodules are seen scattered along the peritoneal surfaces.  Reproductive: Prostate gland is unremarkable.  Other: Multiple areas of loculated fluid are identified in the peritoneal cavity of the abdomen and pelvis.  3.2 x 12.0 cm enhancing soft tissue lesion is seen on the peritoneal reflection of the inferior anatomic pelvis. Other peritoneal disease is seen in the anatomic pelvis.  The patient has a large left inguinal hernia containing fluid which tracks all the way down into the hemiscrotum. There are multiple areas of irregularly enhancing tissue associated with the hernia, presumably reflecting metastatic spread into the inguinal canal.  Musculoskeletal: Bone windows reveal no worrisome lytic or sclerotic osseous lesions.  IMPRESSION: Widespread metastatic disease with bulky hepatic metastases. This is associated with bulky omental and gastrocolic ligament disease. Peritoneal implants are seen in  the abdomen and pelvis and there are loculated areas of fluid within the peritoneal cavity of the abdomen and pelvis. Prominent fluid in the left inguinal canal is associated with areas of irregular enhancing soft tissue suggesting metastatic involvement of an inguinal hernia.  I personally discussed the findings of the study with Dr. Darrel Hoover nurse, Xavier Tanner, at approximately 1515 hours on 06/09/2014.   Electronically Signed   By: Misty Stanley M.D.   On: 06/09/2014 15:23     ASSESSMENT AND PLAN:  Metastatic neoplastic disease New finding of abnormal CT imaging that identifies widespread intraabdominal malignancy. He has a history of Prostate cancer and Stage IIA adenocarcinoma of colon, S/P definitive surgery by Dr. Geroge Baseman on 12/15/2012 demonstrating a microsatellite stable cancer with an OncoType DX Colon score of 4 revealing a 19% recurrence of disease over 5 years.  He was treated adjuvantly with Xeloda 1000 mg BID 14 days on and 7 days off starting on 03/24/2013 with a dose adjustment on 05/23/2013 due to palmar-plantar erythrodysesthesia to 1000 mg BID 7 days on and 7 days off finishing on 09/21/2013.  Now presenting with widespread intraabdominal malignancy found on recent CT abd/pelvis ordered by Dr. Dalbert Batman after previous imaging in the past suspicious for hepatic lesions.  Today's note provides a chronological outline of imaging tests and findings as dictated above.  Today is the first time I have met the patient.  He is not a candidate for aggressive systemic chemotherapy and given the unlikelihood that he has small cell lung cancer or Non-Hodgkin's lymphoma, utility of treatment in this patient is not good as it will be detrimental his quality of life (QOL).  Therefore, we will not pursue biopsy or more staging tests.  Social worker accompanying the patient is agreeable to this plan of care as she too agrees that aggressive intervention is not in the patient's best interest.  She is clearly looking out  for the patient's best interest during our conversation and she asked appropriate questions.  She is emotionally attached to the patient as well which is well received.  No medications are prescribed today due to the patient's lack of complaints and symptoms.  I will refer the patient to Hospice.  Xavier Tanner has a terminal diagnosis and less than 6 months of life expected.     THERAPY PLAN:  Patient would be best served with comfort care and Hospice referral.   All questions were answered. The patient knows to call the clinic with any problems, questions or concerns. We can certainly see the patient much sooner if necessary.  Patient and plan discussed with Dr. Ancil Linsey and she is in agreement with the aforementioned.   This note is electronically signed by: Xavier Tanner 06/22/2014 11:08 AM  ADDENDUM: Labs faxed to [place] after patient has departed from the clinic demonstrate a WBC of 9.0, Hgb 12.0 g/dL, plt 337, and CEA 334.3.  Xavier Tanner,Xavier Tanner 06/22/2014 11:54 AM

## 2014-08-05 NOTE — Consult Note (Signed)
Psychiatry: Follow-up for this 64 year old gentleman with developmental disability and chronic mental health problems possibly schizophrenia.  The patient has no new complaints today.  He tells me that he is feeling good.  He says he is physically feeling all right.  Not feeling weak not having any pain anywhere.  Denies feeling depressed.  Denies suicidal or homicidal ideation.  Denies hallucinations. of systems is negative for symptoms of depression negative for psychotic symptoms negative for any physical symptoms in particular. status exam: Slightly disheveled but cleaned up today gentleman.  Pleasant and cooperative.  Good eye contact normal psychomotor activity.  Speech is a little bit slurred and somewhat difficult to understand but not pressured and not much different than yesterday.  Affect is smiling and upbeat but generally appropriate.  Mood is stating as being good.  Thoughts appear to be simple and consistent with his moderate mental retardation but no evidence of delusions or bizarre statements.  No evidence of paranoia.  No threatening behavior.  Denies suicidal or homicidal ideation.  Chronic cognitive impairment. This is a 64 year old gentleman was brought into the hospital because of recent worsening of agitation.  There was no sign of infection or other specific medical problem to be causing delirium.  Patient was not really able to shed any light on it.  I discontinued his Seroquel because of the occasional incidents of paradoxical worsening of psychotic symptoms and agitation with that medicine.  I also discontinued his medication for Alzheimer's disease out of concern that it may be agitating.  I started him on a low dose of Risperdal.  I see no sign today of side effects from the Risperdal and his behavior appears to be improved.  Patient does not appear to need inpatient hospitalization.  It is my recommendation that he be discharged back to his living facility.  Chart coordinator will be  contacting them to arrange disposition. Developmental disability with accompanying behavior disturbance  Electronic Signatures: Clapacs, Madie Reno (MD)  (Signed on 18-Jun-15 15:48)  Authored  Last Updated: 18-Jun-15 15:48 by Gonzella Lex (MD)
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
## 2014-09-13 DEATH — deceased

## 2015-06-08 IMAGING — CR DG ABDOMEN 1V
2 series · 2 of 2 positions shown · non-contrast
Comparison: Prior CT abdomen/ pelvis 06/11/2013

CLINICAL DATA: Abdominal distention and pain

EXAM:
ABDOMEN - 1 VIEW

[view not recorded (1 of 2)]
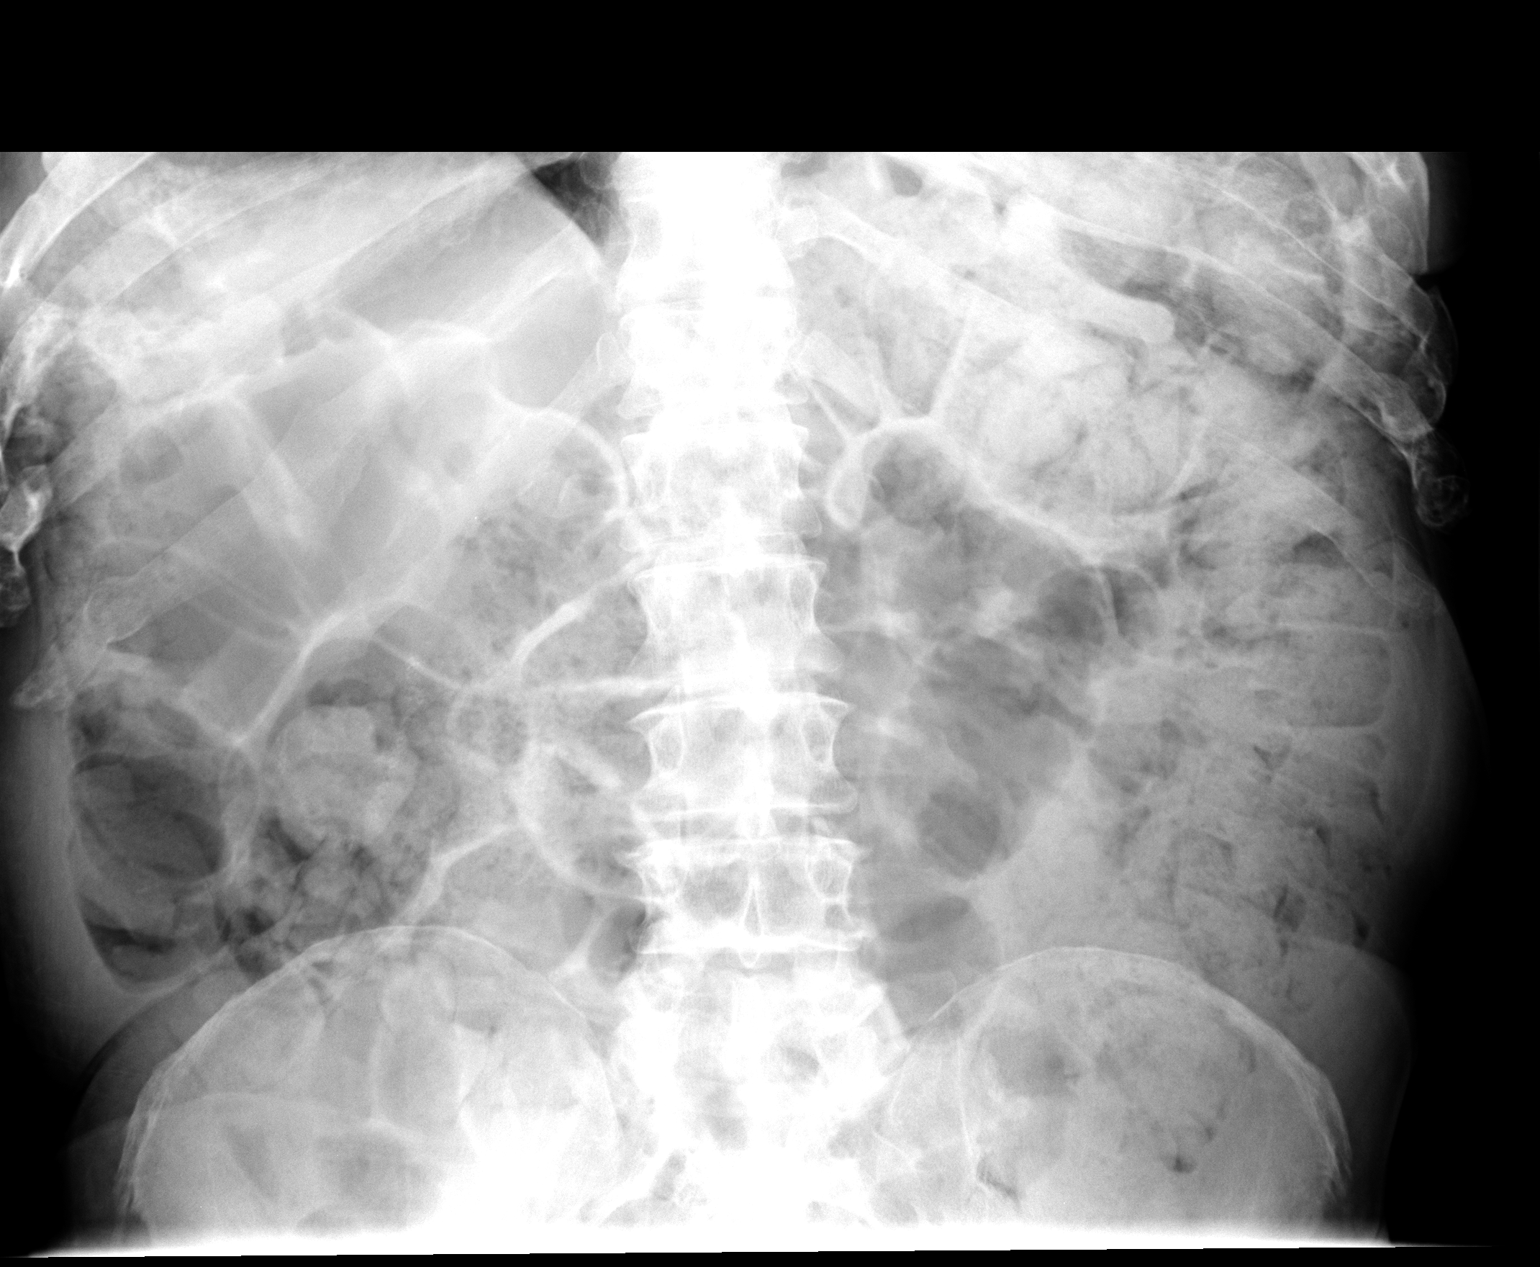

[view not recorded (2 of 2)]
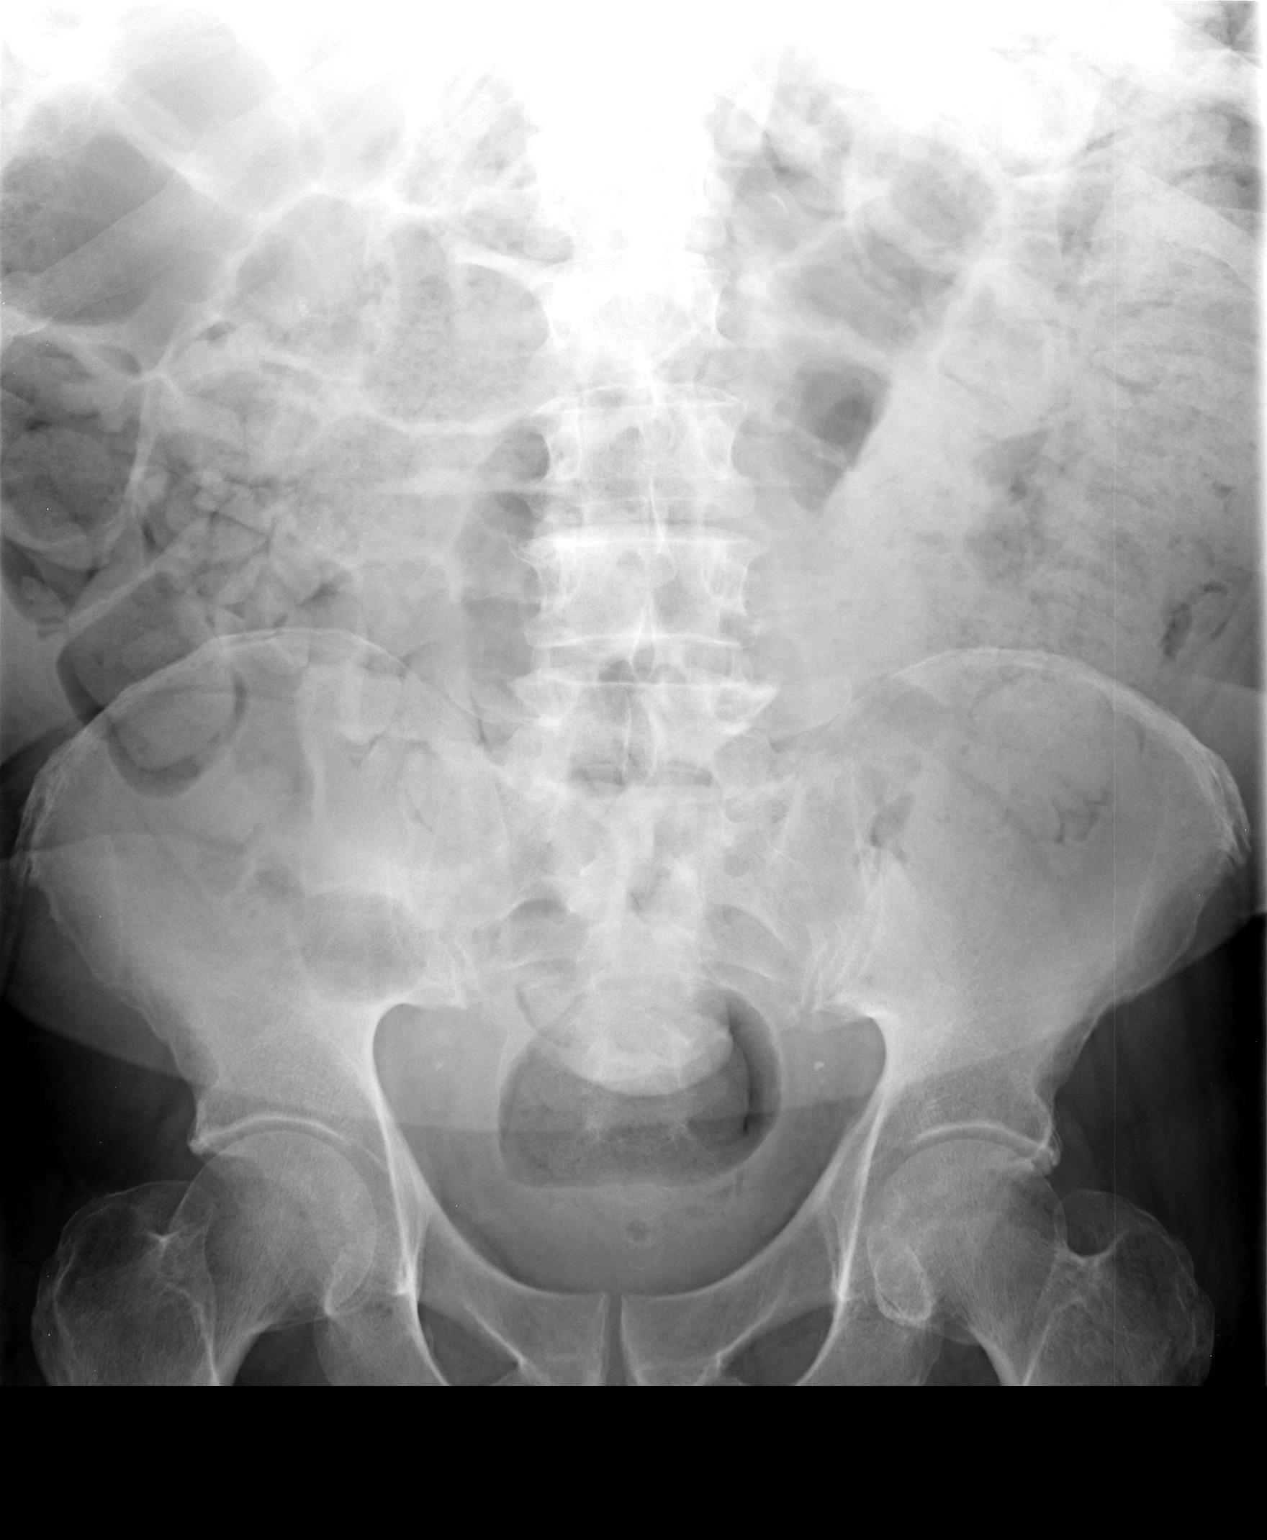

[2 of 2 positions shown; findings below may reference images not displayed]

FINDINGS: No large free air on this supine radiograph. Gas and stool are noted
throughout the colon. The colonic stool burden is high suggesting
underlying constipation. Gas is noted within nondilated loops of
small bowel in the mid abdomen. No acute osseous abnormality.
IMPRESSION: 1. Large colonic stool burden extending to the rectum suggests an
element of constipation.
2. Nonspecific bowel gas pattern with gas throughout both large and
small bowel without definitive dilatation. Findings may be related
to underlying constipation, or ileus.
